# Patient Record
Sex: Female | Born: 1950 | Race: White | Marital: Married | Smoking: Former smoker
Health system: Northeastern US, Academic
[De-identification: ages and names within clinical notes are randomized; demographics above are authoritative.]

## PROBLEM LIST (undated history)

## (undated) DIAGNOSIS — Z79811 Long term (current) use of aromatase inhibitors: Secondary | ICD-10-CM

## (undated) DIAGNOSIS — I2699 Other pulmonary embolism without acute cor pulmonale: Secondary | ICD-10-CM

## (undated) DIAGNOSIS — R55 Syncope and collapse: Secondary | ICD-10-CM

## (undated) DIAGNOSIS — F32A Depression, unspecified: Secondary | ICD-10-CM

## (undated) DIAGNOSIS — M1712 Unilateral primary osteoarthritis, left knee: Secondary | ICD-10-CM

## (undated) DIAGNOSIS — G500 Trigeminal neuralgia: Secondary | ICD-10-CM

## (undated) DIAGNOSIS — D3910 Neoplasm of uncertain behavior of unspecified ovary: Secondary | ICD-10-CM

## (undated) DIAGNOSIS — M797 Fibromyalgia: Secondary | ICD-10-CM

## (undated) DIAGNOSIS — M549 Dorsalgia, unspecified: Secondary | ICD-10-CM

## (undated) DIAGNOSIS — D72819 Decreased white blood cell count, unspecified: Secondary | ICD-10-CM

## (undated) DIAGNOSIS — Z720 Tobacco use: Secondary | ICD-10-CM

## (undated) DIAGNOSIS — G521 Disorders of glossopharyngeal nerve: Secondary | ICD-10-CM

## (undated) DIAGNOSIS — I82409 Acute embolism and thrombosis of unspecified deep veins of unspecified lower extremity: Secondary | ICD-10-CM

## (undated) DIAGNOSIS — M810 Age-related osteoporosis without current pathological fracture: Secondary | ICD-10-CM

## (undated) DIAGNOSIS — K219 Gastro-esophageal reflux disease without esophagitis: Secondary | ICD-10-CM

## (undated) DIAGNOSIS — C55 Malignant neoplasm of uterus, part unspecified: Secondary | ICD-10-CM

## (undated) DIAGNOSIS — K529 Noninfective gastroenteritis and colitis, unspecified: Secondary | ICD-10-CM

## (undated) DIAGNOSIS — H269 Unspecified cataract: Secondary | ICD-10-CM

## (undated) DIAGNOSIS — J449 Chronic obstructive pulmonary disease, unspecified: Secondary | ICD-10-CM

## (undated) HISTORY — DX: Decreased white blood cell count, unspecified: D72.819

## (undated) HISTORY — DX: Neoplasm of uncertain behavior of unspecified ovary: D39.10

## (undated) HISTORY — DX: Trigeminal neuralgia: G50.0

## (undated) HISTORY — PX: BREAST BIOPSY: SHX20

## (undated) HISTORY — PX: APPENDECTOMY: SHX54

## (undated) HISTORY — DX: Other pulmonary embolism without acute cor pulmonale: I26.99

## (undated) HISTORY — DX: Noninfective gastroenteritis and colitis, unspecified: K52.9

## (undated) HISTORY — DX: Malignant neoplasm of uterus, part unspecified: C55

## (undated) HISTORY — DX: Unspecified cataract: H26.9

## (undated) HISTORY — DX: Tobacco use: Z72.0

## (undated) HISTORY — DX: Disorders of glossopharyngeal nerve: G52.1

## (undated) HISTORY — DX: Gastro-esophageal reflux disease without esophagitis: K21.9

## (undated) HISTORY — DX: Syncope and collapse: R55

## (undated) HISTORY — DX: Dorsalgia, unspecified: M54.9

## (undated) HISTORY — DX: Depression, unspecified: F32.A

## (undated) HISTORY — DX: Fibromyalgia: M79.7

## (undated) HISTORY — PX: OVARY REMOVAL: SHX86

## (undated) HISTORY — DX: Acute embolism and thrombosis of unspecified deep veins of unspecified lower extremity: I82.409

## (undated) HISTORY — DX: Age-related osteoporosis without current pathological fracture: M81.0

## (undated) HISTORY — DX: Long term (current) use of aromatase inhibitors: Z79.811

## (undated) HISTORY — DX: Unilateral primary osteoarthritis, left knee: M17.12

## (undated) HISTORY — DX: Chronic obstructive pulmonary disease, unspecified: J44.9

## (undated) MED FILL — ERX 40807065 - SEQUENCING OF DRUG OF ADMINISTRATION: Qty: 1 | Status: AC

## (undated) MED FILL — Paclitaxel IV Conc 300 MG/50ML (6 MG/ML): INTRAVENOUS | Qty: 40 | Status: AC

## (undated) MED FILL — Carboplatin IV Soln 450 MG/45ML: INTRAVENOUS | Qty: 35.9 | Status: AC

## (undated) MED FILL — Carboplatin IV Soln 450 MG/45ML: INTRAVENOUS | Qty: 37.75 | Status: AC

---
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
## 1968-01-31 HISTORY — PX: MANDIBLE FRACTURE SURGERY: SHX706

## 1995-01-31 HISTORY — PX: OTHER SURGICAL HISTORY: SHX169

## 1998-01-30 HISTORY — PX: KNEE ARTHROSCOPY: SHX127

## 2001-01-30 HISTORY — PX: CRANIOTOMY: SHX93

## 2003-01-31 HISTORY — PX: ANKLE SURGERY: SHX546

## 2007-01-31 HISTORY — PX: CHOLECYSTECTOMY: SHX55

## 2008-05-30 HISTORY — PX: UPPER GASTROINTESTINAL ENDOSCOPY: SHX188

## 2008-06-10 ENCOUNTER — Encounter: Payer: Self-pay | Admitting: Cardiology

## 2008-06-16 DIAGNOSIS — C069 Malignant neoplasm of mouth, unspecified: Secondary | ICD-10-CM

## 2008-06-16 HISTORY — DX: Malignant neoplasm of mouth, unspecified: C06.9

## 2008-06-17 ENCOUNTER — Encounter: Payer: Self-pay | Admitting: Cardiology

## 2008-07-06 DIAGNOSIS — C430 Malignant melanoma of lip: Secondary | ICD-10-CM

## 2008-07-06 HISTORY — DX: Malignant melanoma of lip: C43.0

## 2008-10-22 ENCOUNTER — Encounter: Payer: Self-pay | Admitting: Cardiology

## 2009-01-04 ENCOUNTER — Encounter: Payer: Self-pay | Admitting: Otolaryngology

## 2009-02-04 HISTORY — PX: COLONOSCOPY: SHX174

## 2010-01-30 HISTORY — PX: TOTAL KNEE ARTHROPLASTY: SHX125

## 2010-01-30 HISTORY — PX: SALPINGO-OOPHORECTOMY: SHX82

## 2010-01-30 HISTORY — PX: PARTIAL HYSTERECTOMY: SHX80

## 2010-07-31 HISTORY — PX: ENDOMETRIAL BIOPSY: SHX622

## 2010-08-23 DIAGNOSIS — E559 Vitamin D deficiency, unspecified: Secondary | ICD-10-CM | POA: Insufficient documentation

## 2010-11-15 ENCOUNTER — Encounter: Payer: Self-pay | Admitting: Gastroenterology

## 2010-11-15 NOTE — Unmapped External Note (Signed)
Post Operative/Procedure Note      Procedure(s) (LRB):  LAPAROSCOPIC SALPINGO-OOPHORECTOMY (Bilateral),  HYSTEROSCOPY DIAGNOSTIC, DILATATION & CURETTAGE (CPT & [id number]), cystoscopy, polypectomy    Findings: Anterior and posterior vaginal wall prolapse ( Grade II). Uterus sounded to 9 cm.   Endometrial polyp on anterior uterine wall. Hemorrhagic right ovarian cyst ( ruptured) with approximately 50 ml of blood in the belly.   Normal ovary on the left. Normal appearing bilateral fallopian tubes.   Cystoscopy: Bilateral ureteral jets.      Surgeon(s):  Worthy Flank, DO  Ailene Rud, MD     Assistants: Dr. Ronnette Juniper    Anesthesia:  General    Blood Loss:  less than 50 mL    Fluids: As per anesthesia.            Drains:none    Condition:  stable              Specimens Removed:  Endometrial curetting's, Endometrial Polyp, Right hemorrhagic ovarian cyst. Bilateral fallopian tubes and left ovary.            Postoperative Diagnosis: POST MENOPAUSAL BLEEDING,COMPLEX OVARIAN CYST  Dictation: #[id number]  Ailene Rud, MD

## 2010-11-28 ENCOUNTER — Encounter: Payer: Self-pay | Admitting: Gastroenterology

## 2010-12-13 ENCOUNTER — Encounter: Payer: Self-pay | Admitting: Gynecologic Oncology

## 2010-12-14 ENCOUNTER — Encounter: Payer: Self-pay | Admitting: Gynecologic Oncology

## 2010-12-14 ENCOUNTER — Ambulatory Visit: Payer: Self-pay | Admitting: Gynecologic Oncology

## 2010-12-14 VITALS — BP 132/70 | HR 82 | Ht 60.24 in | Wt 158.2 lb

## 2010-12-14 DIAGNOSIS — C569 Malignant neoplasm of unspecified ovary: Secondary | ICD-10-CM

## 2010-12-14 NOTE — Progress Notes (Signed)
Subjective:      Patient ID: Julie Walsh is a 60 y.o. female    HPI patient recently had laparoscopic BSO patient presented with bleeding and ultrasound was done. Had D&C at time of laparoscopy. 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 Path from [facility] showed simple hyperplasia without atypia.    Chief Complaint   Patient presents with   . Initial Evaluation    No past medical history on file.   Patient Active Problem List   Diagnoses Date Noted   . 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 Carcinoma Of The Lip 07/03/2008     Created by Conversion       . 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 Basal Cell Carcinoma Of The Skin Of The Face 05/28/2008     Created by Conversion         Past Surgical History   Procedure Date   . Total knee arthroplasty 2012   . Partial hysterectomy 2012   . Cholecystectomy 2011       Current Outpatient Prescriptions on File Prior to Visit   Medication Sig Dispense Refill   . mometasone (ELOCON) 0.1 % cream   45  0   . omeprazole (PRILOSEC) 20 MG capsule Take  by mouth.  30  0   . trazodone (DESYREL) 150 MG tablet     0   . warfarin (COUMADIN) 4 MG tablet     0   . metoclopramide (REGLAN) 5 MG tablet     0   . SF 5000 PLUS 1.1 % dental gel Place  onto teeth.  51  0   . HYDROcodone-acetaminophen (VICODIN) 5-500 MG per tablet     0   . pantoprazole (PROTONIX) 40 MG EC tablet     0     Allergies   Allergen Reactions   . Feldene (Piroxicam) Rash   . No Known Latex Allergy      Created by Conversion - 0;    }     Review of Systems   Constitutional: Negative.    Eyes: Negative.    Respiratory: Negative.    Cardiovascular: Negative.    Gastrointestinal: Negative.    Genitourinary: Negative.    Musculoskeletal: Negative.    Skin: Negative.     Neurological: Negative.    Hematological: Negative.    Psychiatric/Behavioral: Negative.            Objective:     Filed Vitals:    12/14/10 1246   BP: 132/70   Pulse: 82   Height: 153 cm (5' 0.24")   Weight: 71.759 kg (158 lb 3.2 oz)       No results found for this or any previous visit (from the past 72 hour(s)).        Physical Exam   Constitutional: She is oriented to person, place, and time. She appears well-developed and well-nourished.   HENT:   Head: Normocephalic and atraumatic.   Eyes: Conjunctivae and EOM are normal. Pupils are equal, round, and reactive to light.   Neck: Normal range of  motion. Neck supple.   Cardiovascular: Normal rate and regular rhythm.    Pulmonary/Chest: Effort normal and breath sounds normal.   Abdominal: Soft. Bowel sounds are normal.   Musculoskeletal: Normal range of motion.   Neurological: She is alert and oriented to person, place, and time. She has normal reflexes.   Skin: Skin is warm and dry.       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 Basal Cell Carcinoma Of The Skin Of The Face 173.3   . Lentigo Maligna Melanoma Of The Skin 172.9   . Adenocarcinoma Of The Oral Cavity 145.9   . Melanoma In Situ Of The Lip 230.0   . Carcinoma Of The Lip 140.9   . Malignant Melanoma Of The Lip 172.0         Plan:     Observation with f/u abdominal/pelvic ultrasound in 6 months  Discussed additional surgical intervention - not clear that this is warranted at this time.

## 2010-12-14 NOTE — Progress Notes (Deleted)
Subjective:      Patient ID: Julie Walsh is a 60 y.o. female    HPI patient recently had laparoscopic BSO patient presented with bleeding and ultrasound was done. Had D&C at time of laparoscopy. 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 Path from [facility] showed simple hyperplasia without atypia.    Chief Complaint   Patient presents with   . Initial Evaluation    No past medical history on file.   Patient Active Problem List   Diagnoses Date Noted   . 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 Carcinoma Of The Lip 07/03/2008     Created by Conversion       . 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 Basal Cell Carcinoma Of The Skin Of The Face 05/28/2008     Created by Conversion         Past Surgical History   Procedure Date   . Total knee arthroplasty 2012   . Partial hysterectomy 2012   . Cholecystectomy 2011       Current Outpatient Prescriptions on File Prior to Visit   Medication Sig Dispense Refill   . mometasone (ELOCON) 0.1 % cream   45  0   . omeprazole (PRILOSEC) 20 MG capsule Take  by mouth.  30  0   . trazodone (DESYREL) 150 MG tablet     0   . warfarin (COUMADIN) 4 MG tablet     0   . metoclopramide (REGLAN) 5 MG tablet     0   . SF 5000 PLUS 1.1 % dental gel Place  onto teeth.  51  0   . HYDROcodone-acetaminophen (VICODIN) 5-500 MG per tablet     0   . pantoprazole (PROTONIX) 40 MG EC tablet     0     Allergies   Allergen Reactions   . Feldene (Piroxicam) Rash   . No Known Latex Allergy      Created by Conversion - 0;    }     Review of Systems   Constitutional: Negative.    Eyes: Negative.    Respiratory: Negative.    Cardiovascular: Negative.    Gastrointestinal: Negative.    Genitourinary: Negative.    Musculoskeletal: Negative.    Skin: Negative.     Neurological: Negative.    Hematological: Negative.    Psychiatric/Behavioral: Negative.            Objective:     Filed Vitals:    12/14/10 1246   BP: 132/70   Pulse: 82   Height: 153 cm (5' 0.24")   Weight: 71.759 kg (158 lb 3.2 oz)       No results found for this or any previous visit (from the past 72 hour(s)).        Physical Exam   Constitutional: She is oriented to person, place, and time. She appears well-developed and well-nourished.   HENT:   Head: Normocephalic and atraumatic.   Eyes: Conjunctivae and EOM are normal. Pupils are equal, round, and reactive to light.   Neck: Normal range of  motion. Neck supple.   Cardiovascular: Normal rate and regular rhythm.    Pulmonary/Chest: Effort normal and breath sounds normal.   Abdominal: Soft. Bowel sounds are normal.   Musculoskeletal: Normal range of motion.   Neurological: She is alert and oriented to person, place, and time. She has normal reflexes.   Skin: Skin is warm and dry.       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 Basal Cell Carcinoma Of The Skin Of The Face 173.3   . Lentigo Maligna Melanoma Of The Skin 172.9   . Adenocarcinoma Of The Oral Cavity 145.9   . Melanoma In Situ Of The Lip 230.0   . Carcinoma Of The Lip 140.9   . Malignant Melanoma Of The Lip 172.0         Plan:     Observation with f/u abdominal/pelvic ultrasound in 6 months  Discussed additional surgical intervention - not clear that this is warranted at this time.

## 2011-12-08 DIAGNOSIS — D3910 Neoplasm of uncertain behavior of unspecified ovary: Secondary | ICD-10-CM | POA: Insufficient documentation

## 2011-12-08 DIAGNOSIS — C561 Malignant neoplasm of right ovary: Secondary | ICD-10-CM

## 2011-12-08 HISTORY — DX: Malignant neoplasm of right ovary: C56.1

## 2011-12-13 ENCOUNTER — Ambulatory Visit: Payer: Self-pay | Admitting: Gynecologic Oncology

## 2011-12-13 ENCOUNTER — Encounter: Payer: Self-pay | Admitting: Gynecologic Oncology

## 2011-12-13 VITALS — BP 130/88 | HR 85 | Ht 60.24 in | Wt 165.0 lb

## 2011-12-13 DIAGNOSIS — D3910 Neoplasm of uncertain behavior of unspecified ovary: Secondary | ICD-10-CM

## 2011-12-13 NOTE — Progress Notes (Signed)
Subjective:      Patient ID: Julie Walsh is a 61 y.o. female    HPI patient had laparoscopic BSO patient presented with bleeding and ultrasound was done. Had D&C at time of laparoscopy. 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 Path from [facility] showed simple hyperplasia without atypia.    Chief Complaint   Patient presents with   . Follow-up    No past medical history on file.   Patient Active Problem List    Diagnosis Date Noted   . Granulosa cell tumor of ovary 12/08/2011   . 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 Basal Cell Carcinoma Of The Skin Of The Face 05/28/2008     Created by Conversion         Past Surgical History   Procedure Laterality Date   . Total knee arthroplasty  2012   . Partial hysterectomy  2012   . Cholecystectomy  2011       Current Outpatient Prescriptions on File Prior to Visit   Medication Sig Dispense Refill   . Cholecalciferol (VITAMIN D3) 1000 UNIT tablet Take 1,000 Units by mouth daily           . mometasone (ELOCON) 0.1 % cream   45  0   . omeprazole (PRILOSEC) 20 MG capsule Take  by mouth.  30  0   . HYDROcodone-acetaminophen (VICODIN) 5-500 MG per tablet     0   . pantoprazole (PROTONIX) 40 MG EC tablet     0   . trazodone (DESYREL) 150 MG tablet     0   . warfarin (COUMADIN) 4 MG tablet     0   . metoclopramide (REGLAN) 5 MG tablet     0   . SF 5000 PLUS 1.1 % dental gel Place  onto teeth.  51  0     No current facility-administered medications on file prior to visit.     Allergies   Allergen Reactions   . Feldene (Piroxicam) Rash   . No Known Latex Allergy      Created by Conversion - 0;    }     Review of Systems   Constitutional: Negative.    Eyes: Negative.    Respiratory: Negative.    Cardiovascular: Negative.    Gastrointestinal: Negative.    Genitourinary: Negative.    Musculoskeletal: Negative.    Skin: Negative.    Neurological: Negative.     Hematological: Negative.    Psychiatric/Behavioral: Negative.            Objective:     Filed Vitals:    12/13/11 1034   BP: 130/88   Pulse: 85   Height: 153 cm (5' 0.24")   Weight: 74.844 kg (165 lb)       No results found for this or any previous visit (from the past 72 hour(s)).        Physical Exam   Constitutional: She is oriented to person, place, and time. She appears well-developed and well-nourished.   HENT:   Head: Normocephalic and atraumatic.   Eyes: Conjunctivae and EOM are normal. Pupils are equal, round, and reactive to light.   Neck: Normal range of motion. Neck supple.   Cardiovascular: Normal rate and regular rhythm.    Pulmonary/Chest:  Effort normal and breath sounds normal.   Abdominal: Soft. Bowel sounds are normal.   Musculoskeletal: Normal range of motion.   Neurological: She is alert and oriented to person, place, and time. She has normal reflexes.   Skin: Skin is warm and dry.   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 Basal Cell Carcinoma Of The Skin Of The Face 173.3   . Adenocarcinoma Of The Oral Cavity 145.9   . Malignant Melanoma Of The Lip 172.0   . Granulosa cell tumor of ovary 236.2         Plan:     Observation  RTC one year  I discussed the proposed plan with the patient and answered all questions.

## 2012-11-27 ENCOUNTER — Encounter: Payer: Self-pay | Admitting: Gynecologic Oncology

## 2012-11-27 ENCOUNTER — Ambulatory Visit: Payer: Self-pay | Admitting: Gynecologic Oncology

## 2012-11-27 VITALS — BP 127/70 | HR 76 | Ht 60.24 in | Wt 156.0 lb

## 2012-11-27 DIAGNOSIS — D3910 Neoplasm of uncertain behavior of unspecified ovary: Secondary | ICD-10-CM

## 2012-11-27 NOTE — Progress Notes (Signed)
Subjective:     Julie Walsh is a 62 y.o. female here today for routine follow up. 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    She is feeling well. Denies vaginal bleeding or spotting, changes with bowel or bladder.     Last Mammogram: last week - normal   Last Colonoscopy: UTD     Review of Systems:   Constitutional: Negative.   Respiratory: Negative. Negative for shortness of breath.   Cardiovascular: Negative. Negative for chest pain.   Gastrointestinal: Negative. Denies changes with bowel or bladder habits  Genitourinary: Negative. Denies vaginal bleeding or spotting.     Objective:   Filed Vitals:    11/27/12 1020   BP: 127/70   Pulse: 76   Height: 153 cm (5' 0.24")   Weight: 70.761 kg (156 lb)     Pain    11/27/12 1020   PainSc:   0 - No pain       Physical Exam:   Constitutional: Alert and Oriented x3, in NAD. She appears well developed and well nourished.   CV: RRR   Resp: CTA bilaterally   Abd: Soft, non tender, no distention  GU: Vagina normal, Surgically absent cervix, uterus, and adnexa. Smooth mobile vaginal cuff without nodularity. No masses palpated on bimanual or rectovaginal exams. No nodularity of the rectovaginal septum.   MSK: No edema   Neuro: A&O x3   Skin: Warm & Dry   Psych: normal mood and affect.    Assessment:  This is a 62 y.o. 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

## 2013-01-17 DIAGNOSIS — R030 Elevated blood-pressure reading, without diagnosis of hypertension: Secondary | ICD-10-CM | POA: Insufficient documentation

## 2013-10-22 ENCOUNTER — Ambulatory Visit: Payer: Self-pay | Admitting: Internal Medicine

## 2013-10-22 VITALS — BP 120/75 | HR 74 | Ht 60.24 in | Wt 157.0 lb

## 2013-10-22 DIAGNOSIS — D3910 Neoplasm of uncertain behavior of unspecified ovary: Secondary | ICD-10-CM

## 2013-10-22 NOTE — Progress Notes (Signed)
Subjective:     Julie Walsh is a 63 y.o. female here today for routine follow up. She is feeling well. Denies vaginal bleeding or spotting, changes with bowel or bladder.     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       Review of Systems:   Constitutional: Negative.   Respiratory: Negative. Negative for shortness of breath.   Cardiovascular: Negative. Negative for chest pain.   Gastrointestinal: Negative. Denies changes with bowel or bladder habits  Genitourinary: Negative. Denies vaginal bleeding or spotting.     Objective:   Filed Vitals:    10/22/13 0952   BP: 120/75   Pulse: 74   Height: 153 cm (5' 0.24")   Weight: 71.215 kg (157 lb)     Pain    10/22/13 0952   PainSc:   0 - No pain       Physical Exam:   Constitutional: Alert and Oriented x3, in NAD. She appears well developed and well nourished.   CV: RRR   Resp: CTA bilaterally   Abd: Soft, non tender, no distention  GU: Rectocele, Surgically absent cervix, uterus, and adnexa. Palpable mass at distal end of vagina. Evaluated by Dr. Loney Loh - he felt it was consistent with cervical tissue. No masses palpated on bimanual or rectovaginal exams. No nodularity of the rectovaginal septum.   MSK: No edema   Neuro: A&O x3   Skin: Warm & Dry   Psych: normal mood and affect.    Assessment:  This is a 63 y.o. female with a history of Stage i granulosa cell tumor of the ovary diagnosed in 12/2010 with NED    Plan:   PAP today   RTC in 12 months

## 2013-10-27 LAB — GYN CYTOLOGY

## 2013-11-19 ENCOUNTER — Ambulatory Visit: Payer: Self-pay | Admitting: Internal Medicine

## 2014-02-05 DIAGNOSIS — N393 Stress incontinence (female) (male): Secondary | ICD-10-CM | POA: Insufficient documentation

## 2014-02-05 HISTORY — DX: Stress incontinence (female) (male): N39.3

## 2014-10-19 ENCOUNTER — Ambulatory Visit: Payer: Self-pay | Admitting: Internal Medicine

## 2014-10-19 VITALS — BP 120/65 | HR 81 | Ht 60.24 in | Wt 160.3 lb

## 2014-10-19 DIAGNOSIS — D3910 Neoplasm of uncertain behavior of unspecified ovary: Secondary | ICD-10-CM

## 2014-10-20 ENCOUNTER — Ambulatory Visit: Payer: Self-pay | Admitting: Internal Medicine

## 2014-10-20 NOTE — Progress Notes (Signed)
Subjective:     Julie Walsh is a 64 y.o. female here today for routine follow up. She has a history of granulosa cell tumor of the ovary. She is feeling well. Denies vaginal bleeding or spotting, changes with bowel or bladder.     Review of Systems:   Constitutional: Negative.   Respiratory: Negative. Negative for shortness of breath.   Cardiovascular: Negative. Negative for chest pain.   Gastrointestinal: Negative. Denies changes with bowel or bladder habits  Genitourinary: Negative. Denies vaginal bleeding or spotting.     Objective:   Vitals:    10/19/14 0937   BP: 120/65   Pulse: 81   Weight: 72.7 kg (160 lb 4.8 oz)   Height: 153 cm (5' 0.24")     Pain    10/19/14 0937   PainSc:   0 - No pain       Physical Exam:   Constitutional: Alert and Oriented x3, in NAD. She appears well developed and well nourished.   CV: RRR   Resp: CTA bilaterally   Abd: Soft, non tender, no distention,   GU: Vagina normal, small amount of remaining cervix. Surgically absent, uterus, and adnexa. Smooth mobile vaginal cuff without nodularity. No masses palpated on bimanual or rectovaginal exams. No nodularity of the rectovaginal septum.   MSK: No edema   Neuro: A&O x3   Skin: Warm & Dry   Psych: normal mood and affect.    Assessment:  This is a 64 y.o. female with a history of granulosa cell tumor of the ovary with NED    Plan:   RTC in 12 months

## 2014-10-21 ENCOUNTER — Ambulatory Visit: Payer: Self-pay | Admitting: Internal Medicine

## 2015-06-11 HISTORY — PX: COLONOSCOPY: SHX174

## 2015-07-13 DIAGNOSIS — K635 Polyp of colon: Secondary | ICD-10-CM

## 2015-07-13 HISTORY — DX: Polyp of colon: K63.5

## 2015-08-11 HISTORY — PX: COLONOSCOPY: SHX174

## 2015-10-18 ENCOUNTER — Ambulatory Visit: Payer: Self-pay | Admitting: Internal Medicine

## 2017-01-12 NOTE — Progress Notes (Signed)
Subjective:        HPI Julie Walsh is a 66 y.o. 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 Patient had laparoscopic BSO in 2012 after presenting with PMB. The pathology revealed granulosa cell tumor of the ovary. She presents today for 12 month surveillance visit. She is feeling well and denies vaginal bleeding and discharge, changes in bowel and bladder habits, abdominal pain and bloating, changes in appetite, and fatigue. She is feeling well overall and has no complaints or concerns today.       Patient's medications, allergies, past medical, surgical, social and family histories were reviewed and updated as appropriate.    Review of Systems  Constitutional: Negative.    HENT: Negative.    Respiratory: Negative.    Cardiovascular: Negative.    Gastrointestinal: Negative for abdominal distention and abdominal pain.   Endocrine: Negative.    Genitourinary: Negative for vaginal bleeding and vaginal discharge.   Musculoskeletal: Negative.    Neurological: Negative.   All other systems were reviewed and are negative      Objective:     Vitals:    01/17/17 1341   BP: 134/70   Pulse: 78   Weight: 78 kg (172 lb)   Height: 153 cm (5' 0.24")       Pain    01/17/17 1341   PainSc:   0 - No pain       Physical Exam   Constitutional: She appears well developed and well nourished and in NAD  CV: RRR   Resp: CTA bilaterally   Abd: Soft, non tender, no distention, well healed surgical incisions without evidence of infection  GU: Vagina normal with small amount of remaining cervix, surgically absent  uterus, and adnexa. Smooth vaginal cuff without nodularity. No masses palpated on bimanual and rectovaginal exam. No nodularity or masses palpated on rectovaginal septum.   MSK: No edema  Lymph: No cervical or inguinal lymphadenopathy noted   Neuro: A&O x3   Skin: Warm & Dry   Psych: Normal mood and affect.  Vital signs reviewed      Assessment:     Julie Walsh is a 66 y.o. 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 Margrett Rud FNP-C

## 2017-01-17 ENCOUNTER — Ambulatory Visit: Payer: Medicare (Managed Care) | Attending: Oncology | Admitting: Oncology

## 2017-01-17 ENCOUNTER — Encounter: Payer: Self-pay | Admitting: Oncology

## 2017-01-17 VITALS — BP 134/70 | HR 78 | Ht 60.24 in | Wt 172.0 lb

## 2017-01-17 DIAGNOSIS — D3910 Neoplasm of uncertain behavior of unspecified ovary: Secondary | ICD-10-CM

## 2017-08-29 DIAGNOSIS — M19072 Primary osteoarthritis, left ankle and foot: Secondary | ICD-10-CM | POA: Insufficient documentation

## 2017-08-29 LAB — UNMAPPED LAB RESULTS
Hematocrit (HT): 42 % (ref 34.0–47.0)
Hemoglobin (HGB) (HT): 14.1 g/dL (ref 11.5–16.0)
MCHC (HT): 33.6 g/dL (ref 32.0–36.0)
MCV (HT): 91.7 FL (ref 81.0–99.0)
Mean Corpuscular Hemoglobin (MCH) (HT): 30.8 pg (ref 26.0–34.0)
Platelets (HT): 292 10 3/uL (ref 140–400)
RBC (HT): 4.58 10 6/uL (ref 3.80–5.20)
RDW (HT): 12.8 % (ref 11.5–15.0)
WBC (HT): 5.5 10 3/uL (ref 4.0–10.8)

## 2018-01-04 NOTE — Progress Notes (Signed)
I tried calling Julie Walsh but no one answered her home phone and I couldn't leave a message. I'll try calling again later today to inform her that she is going to be seeing the other NP Janelle at her appointment on 01/16/18 at 1pm

## 2018-01-16 ENCOUNTER — Ambulatory Visit: Payer: Medicare Other | Admitting: Gynecologic Oncology

## 2018-01-17 ENCOUNTER — Telehealth: Payer: Self-pay

## 2018-01-17 NOTE — Telephone Encounter (Signed)
Called pt to follow up on her missed appt. She was not aware of it. She thought she was done, according to her. She is currently in [place] and will be there until June. She is scheduled for when she returns and I updated her phone numbers, they were incorrect in the system and that is why she did not get the reminder call.

## 2018-03-02 DIAGNOSIS — C569 Malignant neoplasm of unspecified ovary: Secondary | ICD-10-CM

## 2018-03-02 HISTORY — DX: Malignant neoplasm of unspecified ovary: C56.9

## 2018-03-05 NOTE — Progress Notes (Signed)
Patient's doctor's office in [place] will be faxing over a medical release for patient's medical records.

## 2018-03-06 ENCOUNTER — Telehealth: Payer: Medicare Other

## 2018-03-06 NOTE — Telephone Encounter (Signed)
Writer spoke to back to give her requested info.

## 2018-03-06 NOTE — Telephone Encounter (Signed)
Julie Walsh from Dr. [facility] office in [place] called to let us know that patient is scheduled with them to have surgery on Monday 03/11/2018. Patient will have Pelvic mass removed with a possibility of a hysterectomy.  Patient is in [place] until the spring and scheduled with [place] in June.

## 2018-03-06 NOTE — Telephone Encounter (Signed)
Name of Caller: Lataria  Relationship to Patient: Same  Phone Number: [phone]  Provider: Dr. Loney Loh  Main Concern/Reason for call: Patient called to find out what type of cancer she was treated for in the past.

## 2018-03-06 NOTE — Progress Notes (Signed)
I received a request for Julie Walsh's records to be sent to Dr. Sanda Linger to fax number [phone]. I completed the request and sent it to be scanned.

## 2018-05-29 ENCOUNTER — Telehealth: Payer: Self-pay

## 2018-05-29 NOTE — Telephone Encounter (Signed)
Called patient to reschedule her upcoming appointment and she stated she is in [place], stuck there according to her and getting treated for Ovarian cancer. Patient stated she had an emergency surgery with one of the oncologists there recently and will be staying there for treatment. Let her know I would cancel her appointment here and that to reach out to [place] if she needs anything.

## 2018-07-17 ENCOUNTER — Ambulatory Visit: Payer: Medicare Other | Admitting: Gynecologic Oncology

## 2018-08-01 HISTORY — PX: ABDOMINAL EXPLORATION SURGERY: SHX538

## 2018-10-16 LAB — UNMAPPED LAB RESULTS
Hematocrit (HT): 37.8 % (ref 34.0–47.0)
Hemoglobin (HGB) (HT): 12.5 g/dL (ref 11.5–16.0)
MCHC (HT): 33.1 g/dL (ref 32.0–36.0)
MCV (HT): 91.7 FL (ref 81.0–99.0)
Mean Corpuscular Hemoglobin (MCH) (HT): 30.3 pg (ref 26.0–34.0)
Platelets (HT): 299 10 3/uL (ref 140–400)
RBC (HT): 4.12 10 6/uL (ref 3.80–5.20)
RDW (HT): 13.5 % (ref 11.5–15.0)
WBC (HT): 5.4 10 3/uL (ref 4.0–10.8)

## 2018-10-16 NOTE — Progress Notes (Signed)
Subjective      Name Julie Walsh MRN [id number]    DOB 04-25-1950 AGE/SEX 68 y.o./ female     Chief Complaint Chief Complaint   Patient presents with   . Medication Management     f/u from 08/29/17, LOV no medication changes were made labs ordered and done ' DEXA and mammogram still pending    . Colonoscopy Screening     due for colonoscopy this year last done 08/11/15 f/u 3 years ' pt states she no longer needs it due to having ovarian cancer and her colon being damaged during the surgery    . Other     PHQQ9=10         Visit History for  10/16/2018   Problem List <redacted file path>* Health Maintenance <redacted file path>* Results/Data <redacted file path>*  Review Flowsheets <redacted file path>*   History of Present Illness  HPI   Follow-up from 1 year ago.  Patient has been in [place] until recently.  She lives there now but spends several months here in the summertime.  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  She had several inches of intestines removed.  Surgery was complicated.  She was offered no postoperative chemotherapy.        ROS   She reports weakness and fatigue.  She was overcome by this the other day when shopping in a store     Medications <redacted file path> and  Allergies <redacted file path>   Outpatient Medications Marked as Taking for the 10/16/18 encounter (Office Visit) with Gifford Shave, MD   Medication Sig Dispense Refill   . cholecalciferol, Vitamin D3, 2,000 unit Oral Cap Take 1 capsule by mouth daily. 30 capsule 11   . ELIQUIS 2.5 mg Oral Tab tablet TAKE 1 TABLET 2 TIMES DAILY. USES FOR TRAVEL 2 DAYS BEFORE DEPARTURE UNTIL 7 DAYS AFTER ARRIVALS 180 tablet 0   . famotidine 40 MG Oral tablet TAKE 1 TABLET BY MOUTH EVERY DAY AT BEDTIME AS NEEDED 90 tablet 0   . gabapentin 300 MG Oral capsule Take 300 mg by mouth as needed.     . MULTIVIT 33-MTFOLATE-NAC-CHROM ORAL Take  by mouth.     . TRAZODONE 100 MG Oral tablet TAKE 2 TABLETS AT BEDTIME  180 tablet 3   . [DISCONTINUED] FAMOTIDINE 40 MG Oral tablet TAKE 1 TABLET BY MOUTH EVERY DAY AT BEDTIME AS NEEDED 90 tablet 0     Allergies   Allergen Reactions   . Feldene [Piroxicam] Rash       I have reviewed the above medications and allergies    Objective:     Vitals  Modify Vitals <redacted file path>  height is 1.524 m (5') and weight is 60 kg (132 lb 4.8 oz). Her temperature is 36.8 C (98.2 F). Her blood pressure is 100/60 and her pulse is 86. Her oxygen saturation is 97%.  Body mass index is 25.84 kg/m.       Physical Exam  Vitals signs and nursing note reviewed.   Constitutional:       Appearance: She is well-developed.   Cardiovascular:      Rate and Rhythm: Normal rate and regular rhythm.      Heart sounds: Normal heart sounds.   Pulmonary:      Effort: Pulmonary effort is normal.      Breath sounds: Normal breath sounds.   Abdominal:      General: Bowel sounds are  normal.      Palpations: Abdomen is soft.   Neurological:      Mental Status: She is alert and oriented to person, place, and time.                 Assessment/Plan <redacted file path>   Clinical References <redacted file path>*   Pt Intr & Followup <redacted file path>*   Wrap Up <redacted file path>*     Issues Addressed/ Plan <redacted file path>     Problem List Items Addressed This Visit     Anxiety and depression    Ovarian cancer, right (CMS HCC Code) - Primary     Recurrent ovarian cancer. Had surgery. Removed some of her small intestine 03/03/18, 03/05/18 and 8/20 (ileostomy reversal). Not on chemotherapy or radiation therapy. She's under the care of an oncologist in [place].     I would like her evaluated here.  I have read in up-to-date that people with granulosa cell tumors causing recurrent disease should be offered chemotherapy.  We need to get the pathology results from her [facility] hospital ([facility])         Pre-syncope     She is lost a significant amount of muscle mass since her operation because of  complications of surgery.  I think she is just weak.  Check blood test shown here         Vitamin D deficiency    Relevant Orders    Vitamin D 25-OH      Other Visit Diagnoses     Gastroesophageal reflux disease, esophagitis presence not specified        Relevant Medications    famotidine 40 MG Oral tablet    Other Relevant Orders    Magnesium    Vitamin B12    Chronic fatigue        Relevant Orders    TSH with Reflex to Free T4                      Follow up  recommended <redacted file path> Return if symptoms worsen or fail to improve.     Patient verbalized understanding of the above instructions.     Gifford Shave, MD

## 2018-10-16 NOTE — Progress Notes (Signed)
Search Terms: Jayley, Midgette 09/10/1950   Search Date: 10/16/2018 08:15:18 AM   Searching on behalf of: [id number] - Gifford Shave   The Drug Utilization Report below displays all of the controlled substance prescriptions, if any, that your patient has filled in the last twelve months. The information displayed on this report is compiled from pharmacy submissions to the Department, and accurately reflects the information as submitted by the pharmacies.  This report was requested by: Claudina Lick  Reference #: [id number]   There are no results for the search terms that you entered.

## 2018-11-01 LAB — UNMAPPED LAB RESULTS
Basophil # (HT): 0.1 10 3/uL (ref 0.0–0.2)
Basophil % (HT): 1 % (ref 0.0–1.8)
Eosinophil # (HT): 0.2 10 3/uL (ref 0.0–0.5)
Eosinophil % (HT): 3.3 % (ref 0.0–7.0)
Hematocrit (HT): 36.2 % (ref 34.0–47.0)
Hemoglobin (HGB) (HT): 11.8 g/dL (ref 11.5–16.0)
Lymphocyte # (HT): 1.9 10 3/uL (ref 0.9–3.8)
Lymphocyte % (HT): 32.2 % (ref 17.0–44.0)
MCHC (HT): 32.6 g/dL (ref 32.0–36.0)
MCV (HT): 91.2 FL (ref 81.0–99.0)
Mean Corpuscular Hemoglobin (MCH) (HT): 29.7 pg (ref 26.0–34.0)
Monocyte # (HT): 0.4 10 3/uL (ref 0.2–1.0)
Monocyte % (HT): 6.9 % (ref 4.0–12.0)
Neutrophil # (HT): 3.3 10 3/uL (ref 1.5–7.7)
Platelets (HT): 310 10 3/uL (ref 140–400)
RBC (HT): 3.97 10 6/uL (ref 3.80–5.20)
RDW (HT): 13.1 % (ref 11.5–15.0)
Seg Neut % (HT): 56.6 % (ref 40.0–75.0)
WBC (HT): 5.8 10 3/uL (ref 4.0–10.8)

## 2018-11-22 LAB — UNMAPPED LAB RESULTS
Basophil # (HT): 0.1 10 3/uL (ref 0.0–0.2)
Basophil % (HT): 1.6 % (ref 0.0–1.8)
Eosinophil # (HT): 0.1 10 3/uL (ref 0.0–0.5)
Eosinophil % (HT): 2.4 % (ref 0.0–7.0)
Hematocrit (HT): 34.5 % (ref 34.0–47.0)
Hemoglobin (HGB) (HT): 11.6 g/dL (ref 11.5–16.0)
Lymphocyte # (HT): 1.6 10 3/uL (ref 0.9–3.8)
Lymphocyte % (HT): 43.7 % (ref 17.0–44.0)
MCHC (HT): 33.6 g/dL (ref 32.0–36.0)
MCV (HT): 91.8 FL (ref 81.0–99.0)
Mean Corpuscular Hemoglobin (MCH) (HT): 30.9 pg (ref 26.0–34.0)
Monocyte # (HT): 0.5 10 3/uL (ref 0.2–1.0)
Monocyte % (HT): 12.4 % — ABNORMAL HIGH (ref 4.0–12.0)
Neutrophil # (HT): 1.5 10 3/uL (ref 1.5–7.7)
Platelets (HT): 245 10 3/uL (ref 140–400)
RBC (HT): 3.76 10 6/uL — ABNORMAL LOW (ref 3.80–5.20)
RDW (HT): 13.2 % (ref 11.5–15.0)
Seg Neut % (HT): 39.9 % — ABNORMAL LOW (ref 40.0–75.0)
WBC (HT): 3.7 10 3/uL — ABNORMAL LOW (ref 4.0–10.8)

## 2018-12-13 LAB — UNMAPPED LAB RESULTS
Basophil # (HT): 0 10 3/uL (ref 0.0–0.2)
Basophil % (HT): 0.6 % (ref 0.0–1.8)
Eosinophil # (HT): 0 10 3/uL (ref 0.0–0.5)
Eosinophil % (HT): 0.9 % (ref 0.0–7.0)
Hematocrit (HT): 32.9 % — ABNORMAL LOW (ref 34.0–47.0)
Hemoglobin (HGB) (HT): 10.9 g/dL — ABNORMAL LOW (ref 11.5–16.0)
Lymphocyte # (HT): 1.8 10 3/uL (ref 0.9–3.8)
Lymphocyte % (HT): 53.8 % — ABNORMAL HIGH (ref 17.0–44.0)
MCHC (HT): 33.1 g/dL (ref 32.0–36.0)
MCV (HT): 91.9 FL (ref 81.0–99.0)
Mean Corpuscular Hemoglobin (MCH) (HT): 30.4 pg (ref 26.0–34.0)
Monocyte # (HT): 0.3 10 3/uL (ref 0.2–1.0)
Monocyte % (HT): 10.3 % (ref 4.0–12.0)
Neutrophil # (HT): 1.1 10 3/uL — ABNORMAL LOW (ref 1.5–7.7)
Platelets (HT): 316 10 3/uL (ref 140–400)
RBC (HT): 3.58 10 6/uL — ABNORMAL LOW (ref 3.80–5.20)
RDW (HT): 13.3 % (ref 11.5–15.0)
Seg Neut % (HT): 34.4 % — ABNORMAL LOW (ref 40.0–75.0)
WBC (HT): 3.3 10 3/uL — ABNORMAL LOW (ref 4.0–10.8)

## 2018-12-16 LAB — UNMAPPED LAB RESULTS
Basophil # (HT): 0 10 3/uL (ref 0.0–0.2)
Basophil % (HT): 1 % (ref 0–3)
Eosinophil # (HT): 0 10 3/uL (ref 0.0–0.6)
Eosinophil % (HT): 1 % (ref 0–5)
Hematocrit (HT): 34 % — ABNORMAL LOW (ref 35–47)
Hemoglobin (HGB) (HT): 11.3 g/dL — ABNORMAL LOW (ref 12.0–16.0)
Lymphocyte # (HT): 1.8 10 3/uL (ref 1.0–4.8)
Lymphocyte % (HT): 47 % — ABNORMAL HIGH (ref 15–45)
MCHC (HT): 33.1 g/dL (ref 31.0–37.5)
MCV (HT): 93 fL (ref 80–100)
Mean Corpuscular Hemoglobin (MCH) (HT): 31 pg (ref 26.0–34.0)
Monocyte # (HT): 0.3 10 3/uL (ref 0.1–1.0)
Monocyte % (HT): 8 % (ref 0–15)
Neutrophil # (HT): 1.6 10 3/uL — ABNORMAL LOW (ref 1.8–8.0)
Platelets (HT): 322 10 3/uL (ref 150–450)
RBC (HT): 3.65 10 6/uL — ABNORMAL LOW (ref 3.80–5.20)
RDW (HT): 13.4 % (ref 0.0–15.2)
Seg Neut % (HT): 43 % — ABNORMAL LOW (ref 45–75)
WBC (HT): 3.8 10 3/uL — ABNORMAL LOW (ref 4.0–11.0)

## 2019-01-03 LAB — UNMAPPED LAB RESULTS
Basophil # (HT): 0 10 3/uL (ref 0.0–0.2)
Basophil % (HT): 1.1 % (ref 0.0–1.8)
Eosinophil # (HT): 0 10 3/uL (ref 0.0–0.5)
Eosinophil % (HT): 0.6 % (ref 0.0–7.0)
Hematocrit (HT): 32.9 % — ABNORMAL LOW (ref 34.0–47.0)
Hemoglobin (HGB) (HT): 11.3 g/dL — ABNORMAL LOW (ref 11.5–16.0)
Lymphocyte # (HT): 1.7 10 3/uL (ref 0.9–3.8)
Lymphocyte % (HT): 48.6 % — ABNORMAL HIGH (ref 17.0–44.0)
MCHC (HT): 34.3 g/dL (ref 32.0–36.0)
MCV (HT): 91.6 FL (ref 81.0–99.0)
Mean Corpuscular Hemoglobin (MCH) (HT): 31.5 pg (ref 26.0–34.0)
Monocyte # (HT): 0.6 10 3/uL (ref 0.2–1.0)
Monocyte % (HT): 15.9 % — ABNORMAL HIGH (ref 4.0–12.0)
Neutrophil # (HT): 1.2 10 3/uL — ABNORMAL LOW (ref 1.5–7.7)
Platelets (HT): 256 10 3/uL (ref 140–400)
RBC (HT): 3.59 10 6/uL — ABNORMAL LOW (ref 3.80–5.20)
RDW (HT): 15.3 % — ABNORMAL HIGH (ref 11.5–15.0)
Seg Neut % (HT): 33.8 % — ABNORMAL LOW (ref 40.0–75.0)
WBC (HT): 3.5 10 3/uL — ABNORMAL LOW (ref 4.0–10.8)

## 2019-01-23 LAB — UNMAPPED LAB RESULTS
Basophil # (HT): 0 10 3/uL (ref 0.0–0.2)
Basophil % (HT): 0.4 % (ref 0.0–1.8)
Eosinophil # (HT): 0 10 3/uL (ref 0.0–0.5)
Eosinophil % (HT): 1.2 % (ref 0.0–7.0)
Hematocrit (HT): 32.4 % — ABNORMAL LOW (ref 34.0–47.0)
Hemoglobin (HGB) (HT): 10.8 g/dL — ABNORMAL LOW (ref 11.5–16.0)
Lymphocyte # (HT): 1.6 10 3/uL (ref 0.9–3.8)
Lymphocyte % (HT): 67.5 % — ABNORMAL HIGH (ref 17.0–44.0)
MCHC (HT): 33.3 g/dL (ref 32.0–36.0)
MCV (HT): 95 FL (ref 81.0–99.0)
Mean Corpuscular Hemoglobin (MCH) (HT): 31.7 pg (ref 26.0–34.0)
Monocyte # (HT): 0.3 10 3/uL (ref 0.2–1.0)
Monocyte % (HT): 13.2 % — ABNORMAL HIGH (ref 4.0–12.0)
Neutrophil # (HT): 0.4 10 3/uL — CL (ref 1.5–7.7)
Platelets (HT): 166 10 3/uL (ref 140–400)
RBC (HT): 3.41 10 6/uL — ABNORMAL LOW (ref 3.80–5.20)
RDW (HT): 16 % — ABNORMAL HIGH (ref 11.5–15.0)
Seg Neut % (HT): 17.7 % — ABNORMAL LOW (ref 40.0–75.0)
WBC (HT): 2.4 10 3/uL — ABNORMAL LOW (ref 4.0–10.8)

## 2019-01-27 LAB — UNMAPPED LAB RESULTS
Basophil # (HT): 0 10 3/uL (ref 0.0–0.2)
Basophil % (HT): 1 % (ref 0–3)
Eosinophil # (HT): 0 10 3/uL (ref 0.0–0.6)
Eosinophil % (HT): 1 % (ref 0–5)
Hematocrit (HT): 33 % — ABNORMAL LOW (ref 35–47)
Hemoglobin (HGB) (HT): 10.6 g/dL — ABNORMAL LOW (ref 12.0–16.0)
Lymphocyte # (HT): 1.5 10 3/uL (ref 1.0–4.8)
Lymphocyte % (HT): 45 % (ref 15–45)
MCHC (HT): 32.6 g/dL (ref 31.0–37.5)
MCV (HT): 98 fL (ref 80–100)
Mean Corpuscular Hemoglobin (MCH) (HT): 31.9 pg (ref 26.0–34.0)
Monocyte # (HT): 0.3 10 3/uL (ref 0.1–1.0)
Monocyte % (HT): 10 % (ref 0–15)
Neutrophil # (HT): 1.4 10 3/uL — ABNORMAL LOW (ref 1.8–8.0)
Platelets (HT): 167 10 3/uL (ref 150–450)
RBC (HT): 3.32 10 6/uL — ABNORMAL LOW (ref 3.80–5.20)
RDW (HT): 14.9 % (ref 0.0–15.2)
Seg Neut % (HT): 44 % — ABNORMAL LOW (ref 45–75)
WBC (HT): 3.3 10 3/uL — ABNORMAL LOW (ref 4.0–11.0)

## 2019-02-14 LAB — UNMAPPED LAB RESULTS
Basophil # (HT): 0 10 3/uL (ref 0.0–0.2)
Basophil % (HT): 1 % (ref 0–2)
Eosinophil # (HT): 0 10 3/uL (ref 0.0–5.0)
Eosinophil % (HT): 0 % (ref 0–7)
Hematocrit (HT): 33 % — ABNORMAL LOW (ref 34–47)
Hemoglobin (HGB) (HT): 11.1 g/dL — ABNORMAL LOW (ref 11.5–16.0)
Lymphocyte # (HT): 1.7 10 3/uL (ref 0.9–3.8)
Lymphocyte % (HT): 50 % — ABNORMAL HIGH (ref 17–44)
MCHC (HT): 33.3 g/dL (ref 32.0–36.0)
MCV (HT): 98.2 fL (ref 81.0–99.0)
Mean Corpuscular Hemoglobin (MCH) (HT): 32.7 pg (ref 26.0–34.0)
Monocyte # (HT): 0.5 10 3/uL (ref 0.2–1.0)
Monocyte % (HT): 16 % — ABNORMAL HIGH (ref 4–12)
Neutrophil # (HT): 1.1 10 3/uL — ABNORMAL LOW (ref 1.8–8.0)
Platelets (HT): 248 10 3/uL (ref 140–400)
RBC (HT): 3.39 10 6/uL — ABNORMAL LOW (ref 3.80–5.20)
RDW (HT): 16.3 % — ABNORMAL HIGH (ref 11.5–15.0)
Seg Neut % (HT): 31 % — ABNORMAL LOW (ref 40–75)
WBC (HT): 3.4 10 3/uL — ABNORMAL LOW (ref 4.0–10.8)

## 2019-07-08 LAB — UNMAPPED LAB RESULTS
Hematocrit (HT): 37 % (ref 34–47)
Hemoglobin (HGB) (HT): 12.3 g/dL (ref 11.5–16.0)
MCHC (HT): 33 g/dL (ref 32.0–36.0)
MCV (HT): 94.7 fL (ref 81.0–99.0)
Mean Corpuscular Hemoglobin (MCH) (HT): 31.2 pg (ref 26.0–34.0)
Platelets (HT): 218 10 3/uL (ref 140–400)
RBC (HT): 3.94 10 6/uL (ref 3.80–5.20)
RDW (HT): 12.7 % (ref 11.5–15.0)
WBC (HT): 4 10 3/uL (ref 4.0–10.8)

## 2019-07-29 DIAGNOSIS — I2693 Single subsegmental pulmonary embolism without acute cor pulmonale: Secondary | ICD-10-CM

## 2019-07-29 HISTORY — DX: Single subsegmental thrombotic pulmonary embolism without acute cor pulmonale: I26.93

## 2019-07-29 LAB — UNMAPPED LAB RESULTS
Basophil # (HT): 0 10 3/uL (ref 0.0–0.2)
Basophil % (HT): 1 % (ref 0–3)
Eosinophil # (HT): 0.1 10 3/uL (ref 0.0–0.6)
Eosinophil % (HT): 2 % (ref 0–5)
Hematocrit (HT): 37 % (ref 35–47)
Hemoglobin (HGB) (HT): 12 g/dL (ref 12.0–16.0)
Lymphocyte # (HT): 1.2 10 3/uL (ref 1.0–4.8)
Lymphocyte % (HT): 20 % (ref 15–45)
MCHC (HT): 32.3 g/dL (ref 31.0–37.5)
MCV (HT): 96 fL (ref 80–100)
Mean Corpuscular Hemoglobin (MCH) (HT): 31 pg (ref 26.0–34.0)
Monocyte # (HT): 0.6 10 3/uL (ref 0.1–1.0)
Monocyte % (HT): 10 % (ref 0–15)
Neutrophil # (HT): 4.2 10 3/uL (ref 1.8–8.0)
Platelets (HT): 191 10 3/uL (ref 150–450)
RBC (HT): 3.87 10 6/uL (ref 3.80–5.20)
RDW (HT): 12.7 % (ref 0.0–15.2)
Seg Neut % (HT): 68 % (ref 45–75)
WBC (HT): 6.2 10 3/uL (ref 4.0–11.0)

## 2019-07-30 LAB — UNMAPPED LAB RESULTS
Hematocrit (HT): 35 % (ref 35–47)
Hemoglobin (HGB) (HT): 11.5 g/dL — ABNORMAL LOW (ref 12.0–16.0)
MCHC (HT): 32.5 g/dL (ref 31.0–37.5)
MCV (HT): 96 fL (ref 80–100)
Mean Corpuscular Hemoglobin (MCH) (HT): 31.1 pg (ref 26.0–34.0)
Platelets (HT): 204 10 3/uL (ref 150–450)
RBC (HT): 3.7 10 6/uL — ABNORMAL LOW (ref 3.80–5.20)
RDW (HT): 12.8 % (ref 0.0–15.2)
WBC (HT): 4.3 10 3/uL (ref 4.0–11.0)

## 2019-07-30 NOTE — Discharge Summary (Signed)
-------------------------------------------------------------------------------  Attestation signed by Julie Feinstein, MD at 07/30/2019  1:08 PM  Billing Attending's Addendum and Attestation  I personally evaluated the patient, and contributed to the care plan today.  I agree with the historical, examination, data review findings, and assessment and plan in above note by my resident/midlevel-provider/partner with the applicable key modifications and addendums noted:     History:  Seen and examined patient at bedside this morning.  Patient complains of urinary frequency, dysuria.  She also complains of pleuritic chest pain  Patient wants to go home today.    Exam:  Patient is alert, oriented, comfortably laying in bed  Abdomen soft, nontender  Extremities; no edema's  A skin; no new rash  Respiratory; no respiratory distress  CVS; normal heart rate and rhythm    Data review and decision making:  Agree with hospital stay summary outlined below  Patient presenting with acute PE.  She needs lifelong anticoagulation.  Dosing outlined below  Patient also has symptoms of UTI.  PCP to follow urine culture.  Discharged with p.o. Keflex      .    For billing purposes: for time based billing: I spent more than 35 minutes with this patient's care on the floor today and more than half of that time was spent with counseling and coordination of care, as described in my note (Resident teaching is excluded from this time reporting. Time spent by PA/NP providers directly supervised by the attending and employed by the attending physician's group is included in this time reporting). Julie Feinstein, MD  07/30/2019  1:07 PM   Visit time (if different):     -------------------------------------------------------------------------------    MEDICINE DISCHARGE SUMMARY   Admit Date: 07/29/2019 Disch Date: 07/30/2019 LOS: 1.05 days   Attending PCP Hosp Dept   Julie Feinstein, MD Julie Shave, MD [facility] [address]     HAND OFF  SUMMARY   This section is a brief synopsis of the hospitalization for quick review only. Please refer to the Hospital course section for detailed summary.   REASON AND OUTCOME OF HOSPITALIZATION:   Acute pulmonary embolism   CHANGES IN CHRONIC MANAGEMENT:   1. Eliquis 10 mg BID for 7 days followed by 5 mg BID  2. Keflex for 5 days    FOLLOW UP ISSUES FOR PCP:   1. Follow urine culture   2. Thyroid US outpatient    IMPORTANT LABS, IMAGING AND PROCEDURES:   Result Review <redacted file path>  CT Abdomen/Pelvis:   1.  No evidence of metastatic disease.  2.  Mild bowel wall thickening of the sigmoid colon, findings palpable with colitis/diverticulitis.    CT Chest:   1.  Acute pulmonary embolus at the bifurcation of the left lower lobe segmental arteries. No evidence of right heart strain.  2.  Skin thickening of the left inframammary fold, correlate clinically for infection.  3.  Hypodense lesion within the right hemithyroid measuring 1.4 cm, increased from previously measuring 0.9 cm. Further evaluation with nonemergent ultrasound is recommended.  TESTS ORDERED ON DISCHARGE:   None      PENDING TESTS AT TIME OF DISCHARGE:   PENDING TESTS FROM HOSPITAL STAY (if any): None   DISCHARGE DISPOSITION:   home   HOSPITAL COURSE   Problem list <redacted file path>  Active Hospital Problems    Diagnosis    . PRINCIPAL PROBLEM: Single subsegmental pulmonary embolism without acute cor pulmonale (CMS HCC Code)  Resolved Hospital Problems   No resolved problems to display.       Detailed Problem Based Hospital Summary:    This is a 69 year old female with history of ovarian cancer s/p TAH on Letrozole, COPD, DVT/PE recently taken off Eliquis, GERD, fibromyalgia, and depression who presented with shortness of breath and chest pain.     Acute PE:  Patient has history of DVT/PE for which she had been on Eliquis but recently discontinued and only prescribed prn for travel. She recently traveled from [place] a few weeks ago and had  taken Eliquis for that trip. She was found to have acute PE at bifurcation of left lower lobe segmental arteries. No evidence of right heart strain and she is hemodynamically stable. Initially managed with subcutaneous Lovenox and then transitioned to Eliquis. She is oxygenating well on room air.  -Eliquis 10 mg BID for 7 days followed by 5 mg BID thereafter    UTI:  UA pending. Started on Keflex.  -Keflex 250 mg BID  -Follow urine culture     Right thyroid lesion:  1.4 cm lesion within right hemithyroid incidentally noted on CT.  -Thyroid US outpatient     *Husband updated at bedside     Based on the patients co-morbid conditions, severity of symptoms and acuity of initial presentation with acute PE with risk factors for decompensation including age chronic lung disease, and hx of cancer, there was an initial expectation of a length of stay in excess of 2 midnights prompting an inpatient admission. However patient has since recovered earlier than expected and was discharged prior to 2 midnights. As a result I still believe inpatient status is appropriate for this visit.    CODE status: Full Code    After Visit Summary   For Patient/ Family Instructions, follow-up appointments and Discharge Medications, please refer to After Visit Summary (AVS) <redacted file path>      DISCHARGE DAY SERVICE   INTERVAL HISTORY:   Patient reports having urinary frequency and dysuria today, thinks she has a UTI. Shortness of breath and chest pain have resulted. No fever, cough, lightheadedness, abdominal pain.    PHYSICAL EXAMINATION:   Vitals:    07/29/19 1748 07/29/19 2129 07/30/19 0244 07/30/19 0607   BP: 130/70 (!) 138/96 108/66 108/77   Pulse: 74 83 77 77   Resp: 18 18 18 18    Temp: 36.7 C (98 F) 35.9 C (96.7 F) 36 C (96.8 F) 36.5 C (97.7 F)   TempSrc: Oral Temporal Temporal Temporal   SpO2: 94% 93% 93% 92%   Weight:       Height:         Estimated body mass index is 26.37 kg/m as calculated from the following:    Height  as of this encounter: 1.524 m (5').    Weight as of this encounter: 61.2 kg (135 lb).   Physical Exam  Constitutional:       General: She is not in acute distress.     Appearance: She is well-developed. She is not diaphoretic.   HENT:      Head: Normocephalic and atraumatic.   Cardiovascular:      Rate and Rhythm: Normal rate and regular rhythm.      Heart sounds: Normal heart sounds. No murmur heard.   No friction rub. No gallop.    Pulmonary:      Effort: Pulmonary effort is normal. No respiratory distress.      Breath sounds: No wheezing, rhonchi or  rales.   Abdominal:      General: Bowel sounds are normal. There is no distension.      Palpations: Abdomen is soft.      Tenderness: There is no abdominal tenderness. There is no guarding or rebound.   Skin:     General: Skin is warm and dry.   Neurological:      Mental Status: She is alert and oriented to person, place, and time.          Functional status back to baseline? yes  Cognitive status back to baseline? yes       Electronically signed by:  Artelia Laroche, PA-C  07/30/2019 10:56 AM      Collapsed details   PROVIDER STATEMENT:   I counseled the patient and/or caretaker regarding the hospital course, follow up issues, and answered all related questions. Please refer to AVS for a summary that I created/supervised for details.   My (team's) hand-off communication with the primary care / continuity provider included:      Marland Kitchen   I spent >30 minutes on the floor on the discharge day, managing the patient's care and organizing the discharge.

## 2019-07-30 NOTE — Progress Notes (Signed)
Transitions of Care: Discharge Note    Discharge Plan: Patient expects to be discharged to:: home    Discharge Date and Time: 07/30/2019      Main family Contact and Number:   Name of person provided with discharge information : pt        Discharge Coordination       Ride Arranged: No  Pt spouse will take her home    Patient will need Home Services? : Not applicable           Note: Patient provided listings of all certified homecare agencies/nursing homes in her area contracted with her insurance.     Referral To:          DME/Oxygen needed for discharge?: NA          ** Follow-up appointment visible on AVS: Yes     Coordinated with: Care Manager, Specialist       Patient's plan to pick up discharge medications:   Pt to [facility] meds at [facility] [place].  After talking with pt she has a fair amount of eliquis 2.5 mg at home which she paid a high copay for. She will plan to use this up and then work w/ her PCP on a 5 mg script.  She knows that she has to take 10 mg BID for 7 days and then decrease to 5 mg BID and she feels very confident in her ability to set this up using her pill box and be able to successfully take.  She [facility].    Narrative: Clinical research associate meet w/ pt and spouse and she is dressed and eager for discharge. Her spouse provides transportation as needed to appts.  She is independent w/o a device.  She will call her PCP and set up a follow up.  She has no home care needs.    Valma Cava, RN  Phone #: [phone]  07/30/2019 3:03 PM

## 2019-08-06 DIAGNOSIS — E041 Nontoxic single thyroid nodule: Secondary | ICD-10-CM | POA: Insufficient documentation

## 2019-09-12 LAB — UNMAPPED LAB RESULTS
Basophil # (HT): 0 10 3/uL (ref 0.0–0.2)
Basophil % (HT): 1 % (ref 0–3)
Eosinophil # (HT): 0.1 10 3/uL (ref 0.0–0.6)
Eosinophil % (HT): 3 % (ref 0–5)
Hematocrit (HT): 43 % (ref 35–47)
Hemoglobin (HGB) (HT): 13.6 g/dL (ref 12.0–16.0)
Lymphocyte # (HT): 1.3 10 3/uL (ref 1.0–4.8)
Lymphocyte % (HT): 29 % (ref 15–45)
MCHC (HT): 32 g/dL (ref 31.0–37.5)
MCV (HT): 97 fL (ref 80–100)
Mean Corpuscular Hemoglobin (MCH) (HT): 31 pg (ref 26.0–34.0)
Monocyte # (HT): 0.6 10 3/uL (ref 0.1–1.0)
Monocyte % (HT): 13 % (ref 0–15)
Neutrophil # (HT): 2.4 10 3/uL (ref 1.8–8.0)
Platelets (HT): 188 10 3/uL (ref 150–450)
RBC (HT): 4.39 10 6/uL (ref 3.80–5.20)
RDW (HT): 12.4 % (ref 0.0–15.2)
Seg Neut % (HT): 55 % (ref 45–75)
WBC (HT): 4.4 10 3/uL (ref 4.0–11.0)

## 2019-10-17 HISTORY — PX: COLONOSCOPY: SHX174
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
## 2020-07-19 LAB — UNMAPPED LAB RESULTS
Basophil # (HT): 0 10 3/uL (ref 0.0–0.2)
Basophil % (HT): 1 % (ref 0–2)
Eosinophil # (HT): 0.1 10 3/uL (ref 0.0–0.5)
Eosinophil % (HT): 3 % (ref 0–7)
Hematocrit (HT): 41 % (ref 34–47)
Hemoglobin (HGB) (HT): 13.9 g/dL (ref 11.5–16.0)
Lymphocyte # (HT): 1.5 10 3/uL (ref 0.9–3.8)
Lymphocyte % (HT): 34 % (ref 17–44)
MCHC (HT): 33.9 g/dL (ref 32.0–36.0)
MCV (HT): 93.2 fL (ref 81.0–99.0)
Mean Corpuscular Hemoglobin (MCH) (HT): 31.6 pg (ref 26.0–34.0)
Monocyte # (HT): 0.4 10 3/uL (ref 0.2–1.0)
Monocyte % (HT): 9 % (ref 4–12)
Neutrophil # (HT): 2.4 10 3/uL (ref 1.5–7.7)
Platelets (HT): 232 10 3/uL (ref 140–400)
RBC (HT): 4.4 10 6/uL (ref 3.80–5.20)
RDW (HT): 12.9 % (ref 11.5–15.0)
Seg Neut % (HT): 53 % (ref 40–75)
WBC (HT): 4.5 10 3/uL (ref 4.0–10.8)

## 2020-08-18 DIAGNOSIS — M62838 Other muscle spasm: Secondary | ICD-10-CM

## 2020-08-18 HISTORY — DX: Other muscle spasm: M62.838

## 2020-10-15 ENCOUNTER — Other Ambulatory Visit: Payer: Self-pay | Admitting: Gastroenterology

## 2020-11-24 ENCOUNTER — Other Ambulatory Visit: Payer: Self-pay | Admitting: Gastroenterology

## 2020-11-24 ENCOUNTER — Inpatient Hospital Stay: Admit: 2020-11-24 | Discharge: 2020-11-24 | Disposition: A | Discharge status: Home / Self Care | Payer: Self-pay

## 2020-11-24 IMAGING — MR MRI PELVIS W/WO CONTRAST
15 series · 48 of 48 positions shown · IV contrast (gadavist)
Comparison: None

________________________________________________________________________________________________ 
MRI PELVIS W/WO CONTRAST, 11/24/2020 [DATE]: 
CLINICAL INDICATION: History of ovarian carcinoma rule out recurrent disease. 
Right lower quadrant pain. Previous hysterectomy, appendectomy and colon 
surgery.
TECHNIQUE: Multiplanar, multiecho position MR images of the pelvis were 
performed without and with 8 mL of intravenous Gadavist Patient was scanned on a 
1.5T magnet.

[Series 101: survey · axial · 15.0mm · 1.76mm/px · 1 of 11 slices shown]
[im 1/11]
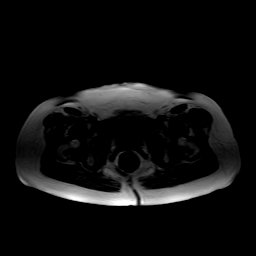

[Series 201: t2_sag_uterus mvxd_hr_ · sagittal · 3.5mm · 0.68mm/px · 2 of 32 slices shown]
[im 1/32]
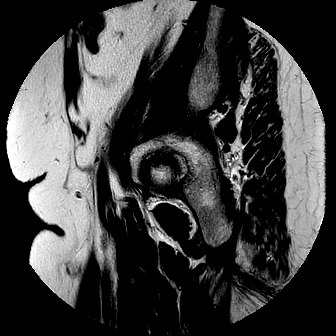
[im 32/32]
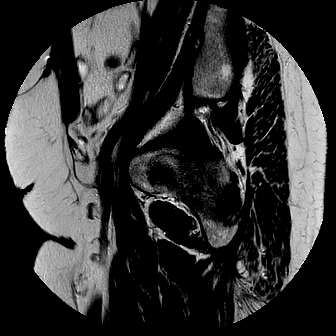

[Series 301: t1w_tse_ax · axial · 4.0mm · 0.66mm/px · z∈[-64,+66]mm · 3 of 27 slices shown]
[im 1/27]
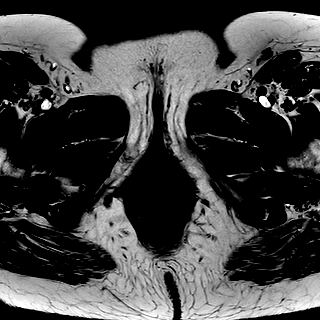
[im 14/27]
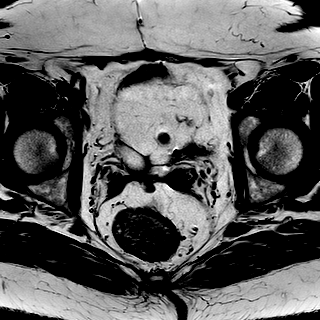
[im 27/27]
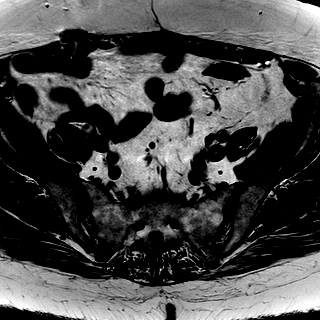

[Series 401: t2_ax_uterus mvxd_hr_ · axial · 4.0mm · 0.68mm/px · z∈[-67,+70]mm · 3 of 32 slices shown]
[im 1/32]
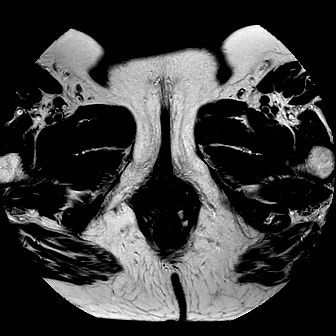
[im 16/32]
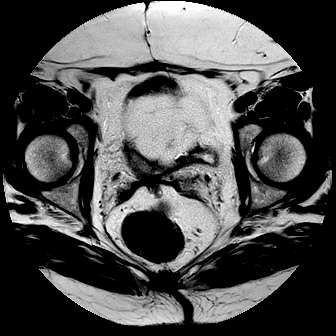
[im 32/32]
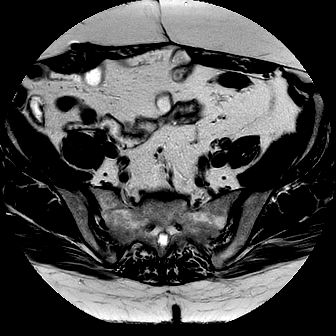

[Series 501: t2_cor_uterus mvxd_hr · coronal · 4.0mm · 0.68mm/px · 3 of 32 slices shown]
[im 1/32]
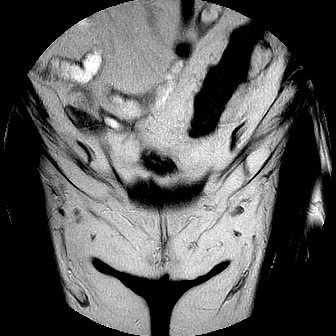
[im 16/32]
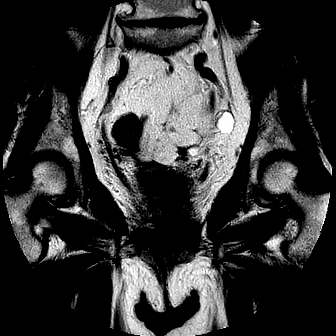
[im 32/32]
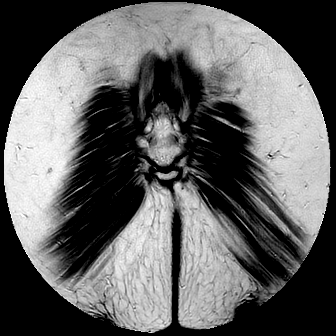

[Series 601: t1w/sp ax · axial · 4.0mm · 0.66mm/px · z∈[-64,+66]mm · 3 of 27 slices shown]
[im 1/27]
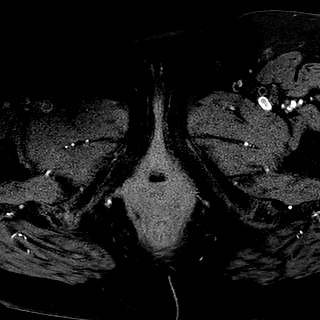
[im 14/27]
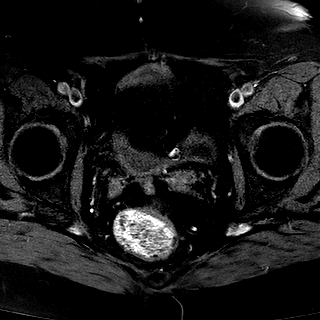
[im 27/27]
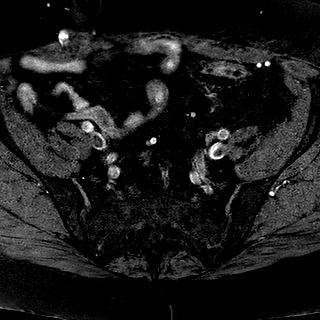

[Series 701: r dwi_(person_name)_3b · axial · 3.0mm · 1.25mm/px · z∈[-45,+48]mm · 6 of 64 slices shown]
[im 1/64]
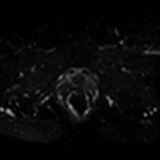
[im 13/64]
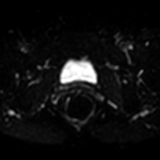
[im 26/64]
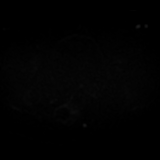
[im 38/64]
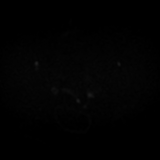
[im 51/64]
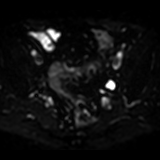
[im 64/64]
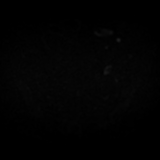

[Series 702: DWI · axial · 3.0mm · 1.25mm/px · z∈[-45,+48]mm · 6 of 64 slices shown]
[im 1/64]
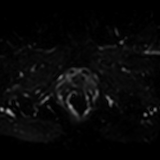
[im 13/64]
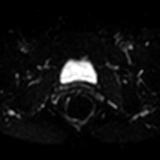
[im 26/64]
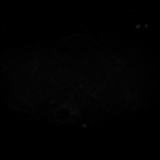
[im 38/64]
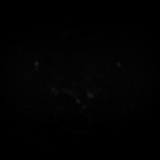
[im 51/64]
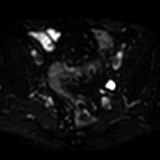
[im 64/64]
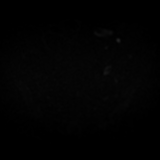

[Series 703: dadc · axial · 3.0mm · 1.25mm/px · z∈[-45,+48]mm · 3 of 31 slices shown]
[im 1/31]
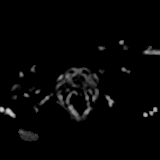
[im 16/31]
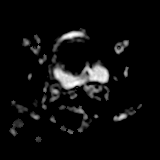
[im 31/31]
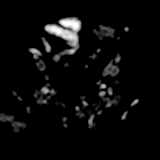

[Series 704: sb0 · axial · 3.0mm · 1.25mm/px · z∈[-45,+48]mm · 3 of 32 slices shown]
[im 1/32]
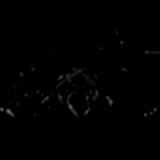
[im 16/32]
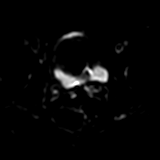
[im 32/32]
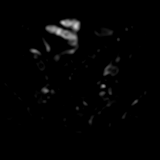

[Series 705: (id) · axial · 3.0mm · 1.25mm/px · z∈[-45,+48]mm · 3 of 32 slices shown]
[im 1/32]
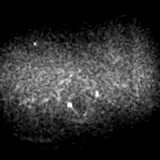
[im 16/32]
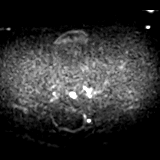
[im 32/32]
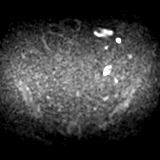

[Series 706: dadc large fov · axial · 3.0mm · 1.25mm/px · z∈[-45,+48]mm · 3 of 32 slices shown]
[im 1/32]
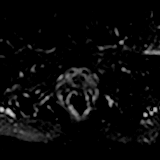
[im 16/32]
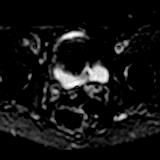
[im 32/32]
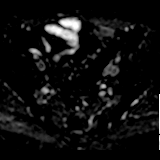

[Series 801: (person_name)1(person_name)/(person_name) · axial · 4.0mm · 0.66mm/px · z∈[-64,+66]mm · 3 of 27 slices shown]
[im 1/27]
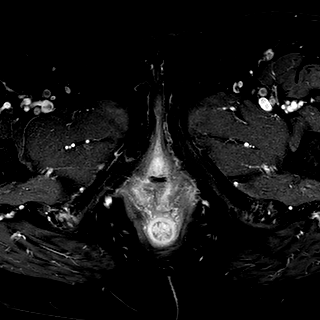
[im 14/27]
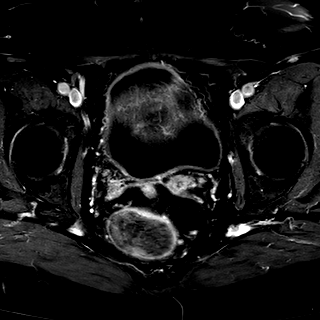
[im 27/27]
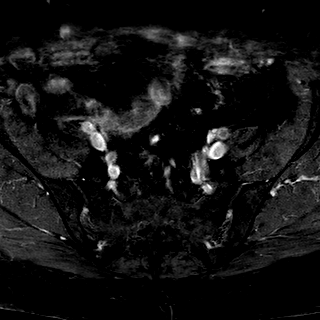

[Series 901: (person_name)1/sp cor · coronal · 4.0mm · 0.43mm/px · 3 of 27 slices shown]
[im 1/27]
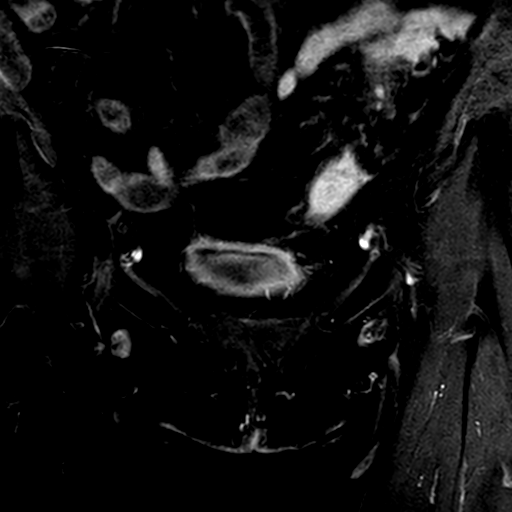
[im 14/27]
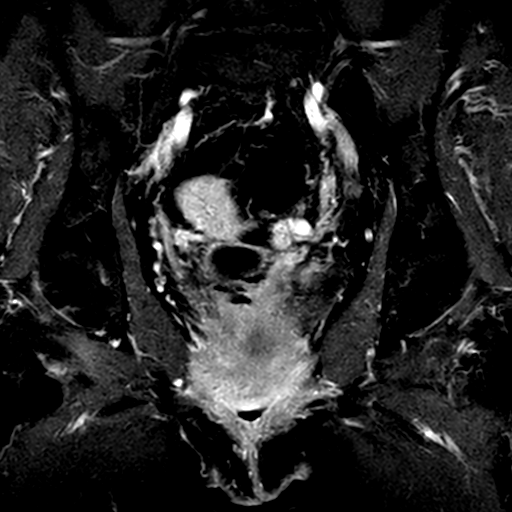
[im 27/27]
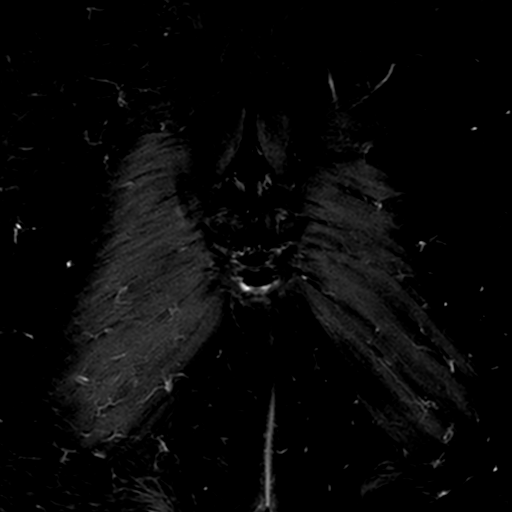

[Series 1001: (person_name)1 sag sp · sagittal · 4.0mm · 0.38mm/px · 3 of 28 slices shown]
[im 1/28]
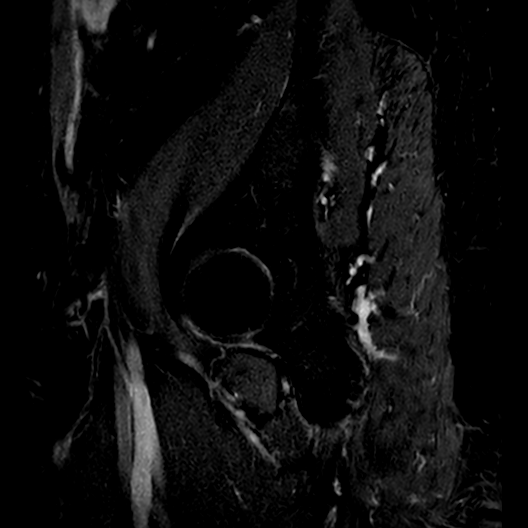
[im 14/28]
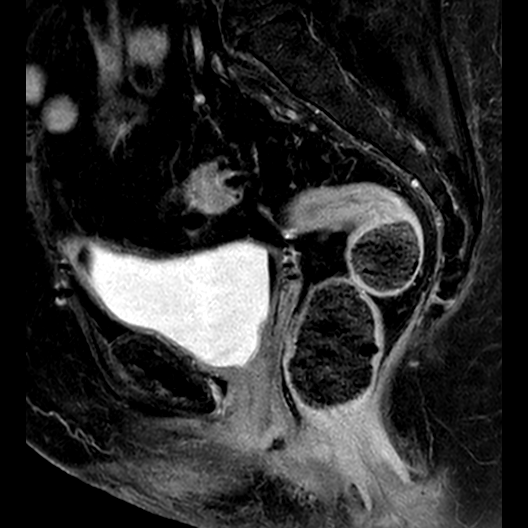
[im 28/28]
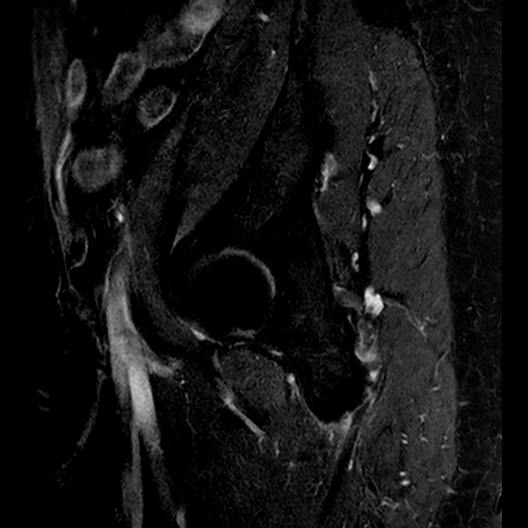

[48 of 48 positions shown; findings below may reference images not displayed]

FINDINGS: There is a cystic structure seen within the left adnexa. Overall measures 13 mm 
in greatest diameter on the postcontrast T1 axial image. Best seen on image 8. 
This does have a enhancing wall. This is more prominent medially where it 
measures up to 3 mm in thickness. However, no nodularity. The entire enhancing 
wall is smooth in appearance. Exact etiology and significance uncertain. 
Uncertain if this is mild residual ovarian or adnexal tissue with cyst. No 
further focal abnormality seen. There is no adenopathy. No free fluid. No mass 
seen in the region of the vaginal fornix. Bladder is unremarkable appearance. 
Diverticulosis. Degenerative changes seen in the spine.
IMPRESSION: Nonspecific cyst with wall enhancement asymmetric thickness medially. Very 
nonspecific. Would consider short-term follow-up initially with sonography. If 
this is visualized could then be followed with sonography. Otherwise would 
consider a repeat MRI in 4 months.

## 2021-05-13 ENCOUNTER — Encounter: Payer: Self-pay | Admitting: Gastroenterology

## 2021-05-26 ENCOUNTER — Other Ambulatory Visit: Payer: Self-pay | Admitting: Gastroenterology

## 2021-05-26 IMAGING — CT CT ABDOMEN AND PELVIS WITH IV AND ORAL CONT
2 of 3 series · 9 of 46 positions shown, 10 images · IV contrast (Iodine)
Comparison: Comparison was made to the prior exam(s) within the last 12 months 
11/24/2020 MRI pelvis.

________________________________________________________________________________________________ 
CT ABDOMEN AND PELVIS WITH IV AND ORAL CONT, 05/26/2021 [DATE]: 
CLINICAL INDICATION: RLQ PAIN, ELEVATED XF-QE3. History of ovarian cancer. 
A search for DICOM formatted images was conducted for prior CT imaging studies 
completed at a non-affiliated media free facility.
TECHNIQUE: The abdomen and pelvis were scanned from lung bases through the 
pubic rami with 100 mL of Isovue 300 contrast on a high-resolution CT scanner 
using dose reduction techniques. Patient drank diluted oral contrast for this 
study. Routine MPR reconstructions were performed. The patients eGFR was 
calculated to be 37.0 mL/min/1.73 m2 using the i-STAT device.

[Series 401: axial w, idose (3) · axial · 0.88mm/px · z∈[+703,+1081]mm · 6 of 158 slices shown, 7 images]
[im 16/158  soft-tissue]
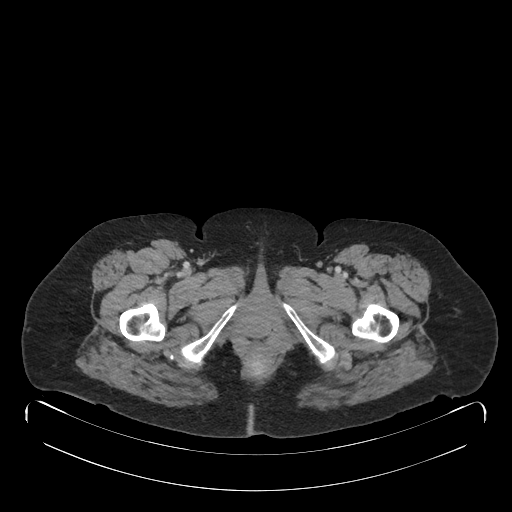
[im 16/158  bone]
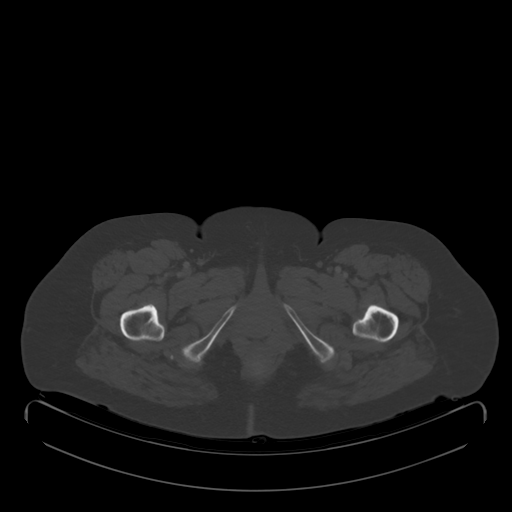
[im 41/158  soft-tissue]
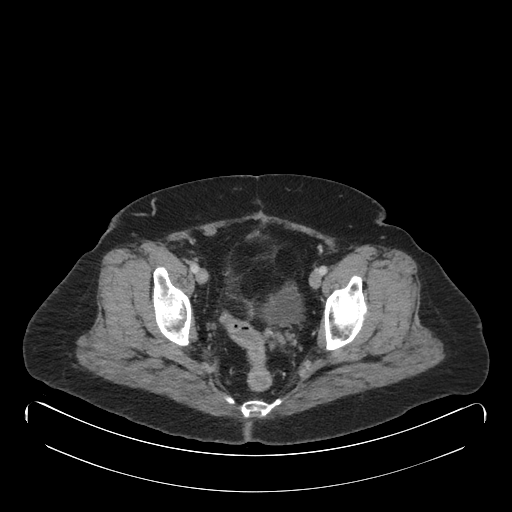
[im 66/158  soft-tissue]
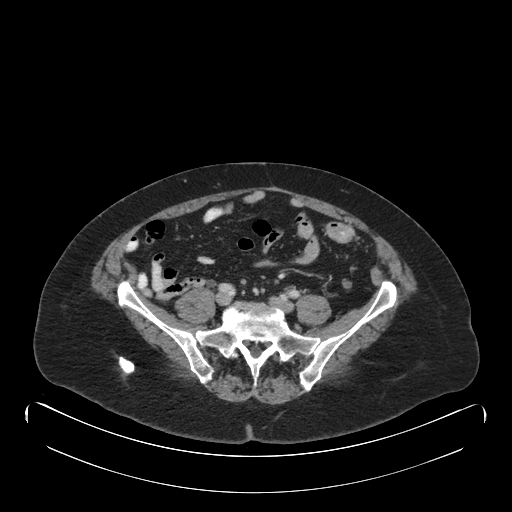
[im 92/158  soft-tissue]
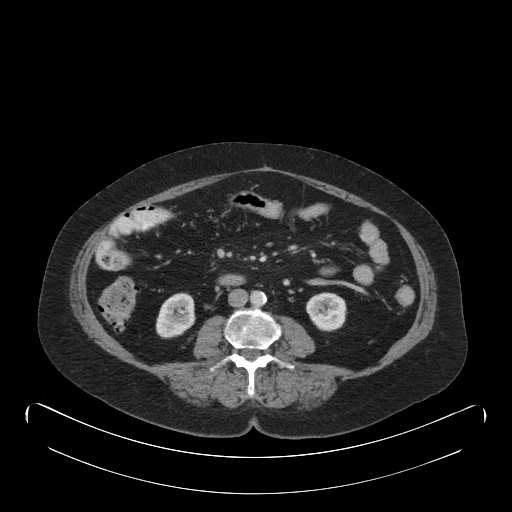
[im 117/158  soft-tissue]
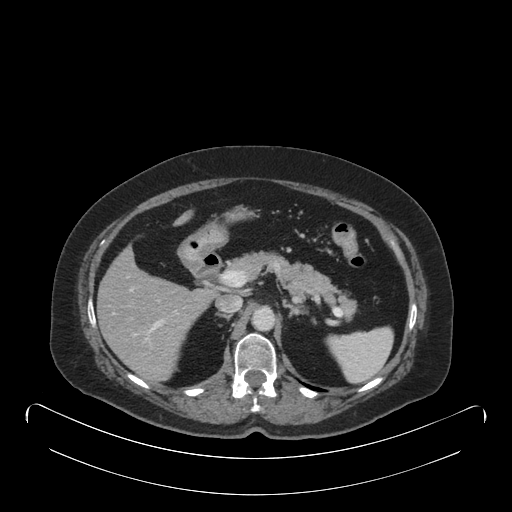
[im 142/158  soft-tissue]
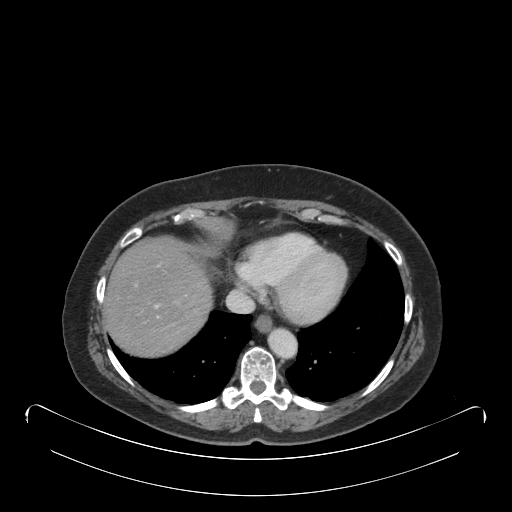

[Series 402: coronal w, idose (3) · coronal · 0.45mm/px · 3 of 106 slices shown]
[im 36/106  soft-tissue]
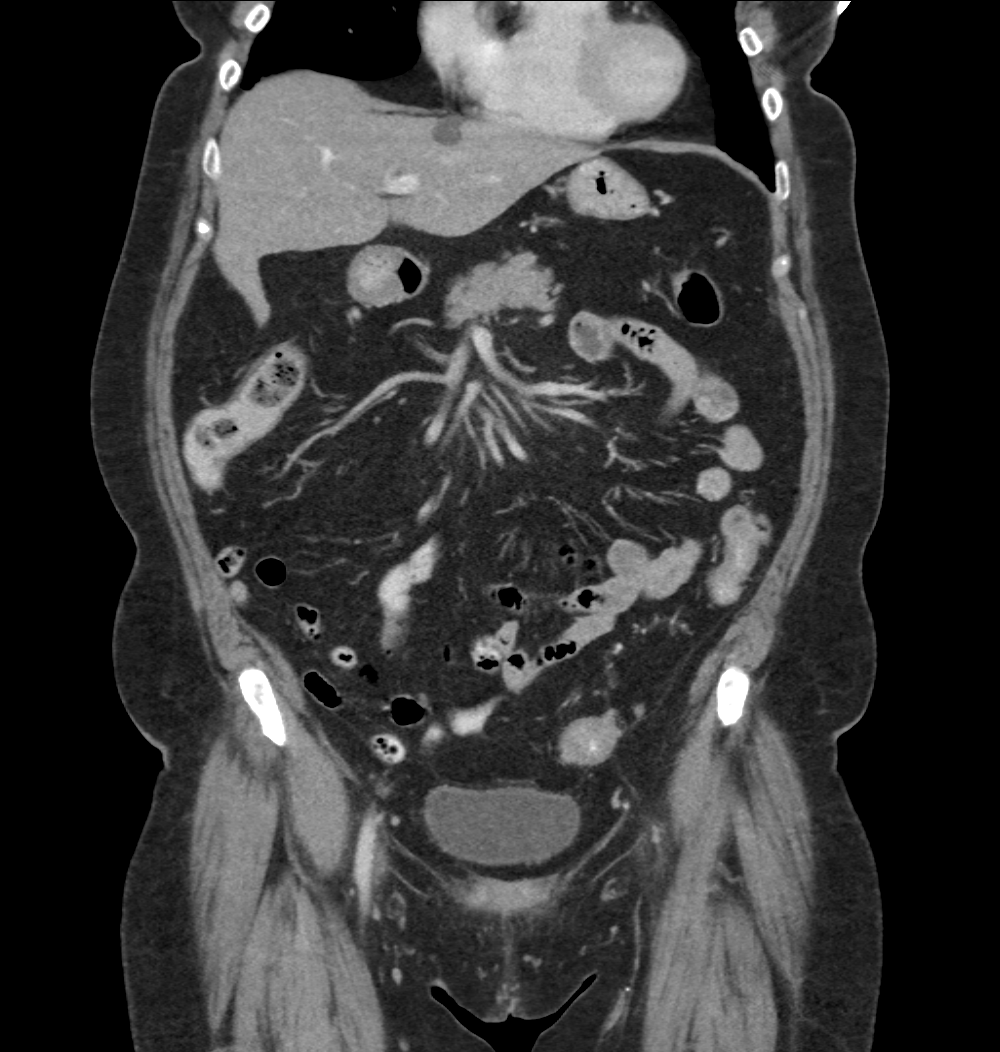
[im 47/106  soft-tissue]
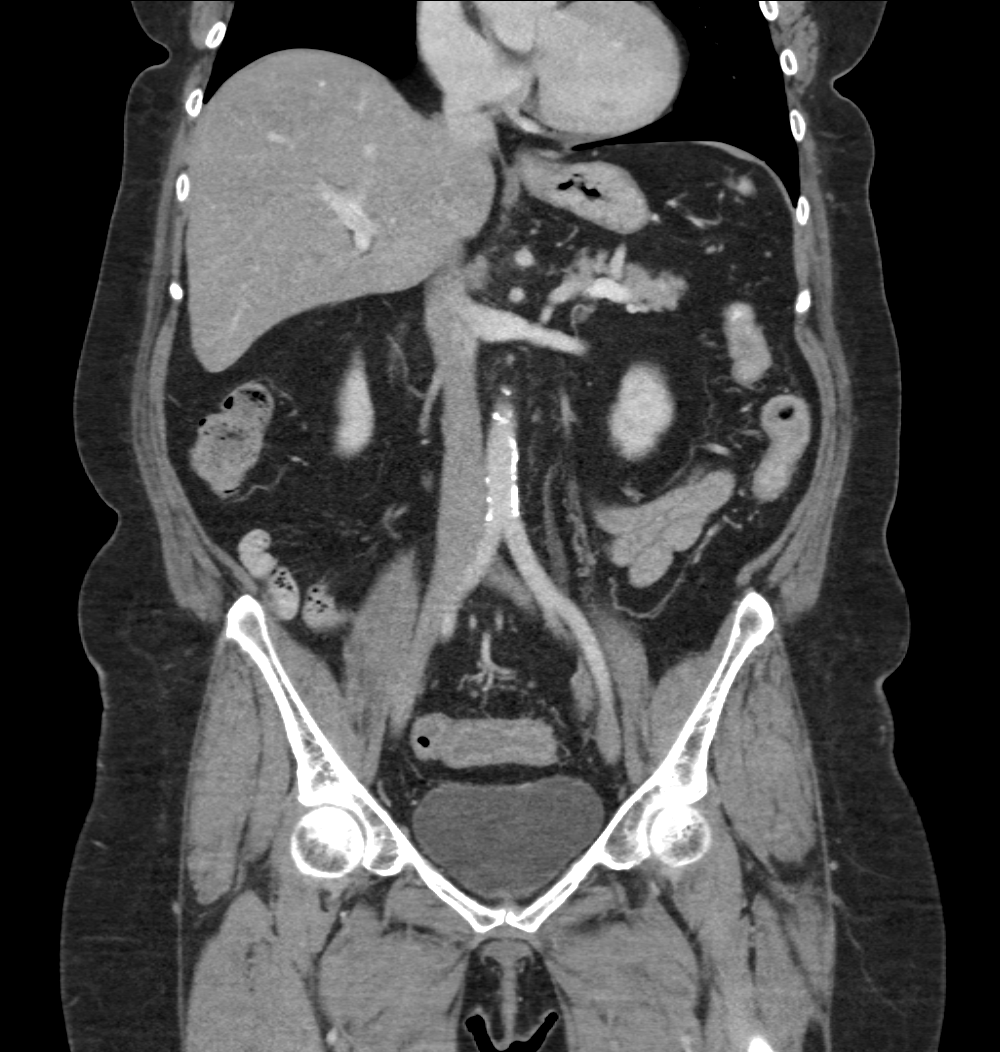
[im 59/106  soft-tissue]
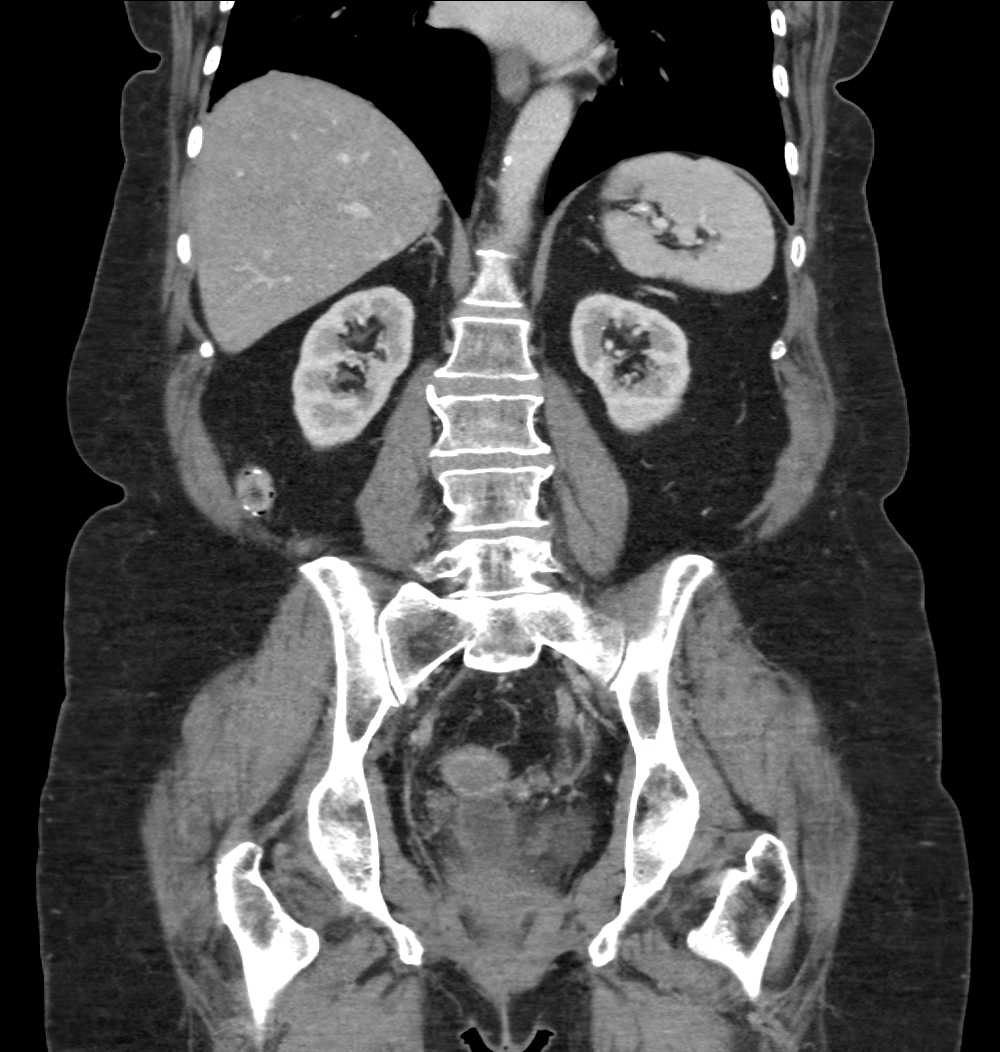

[9 of 46 positions shown; findings below may reference images not displayed]

FINDINGS: LUNG BASES: Lung bases are clear. No pleural effusions. 
HEPATOBILIARY: Images show no mass or biliary dilatation. 1.8 x 1.1 cm septated 
left hepatic cyst. Cholecystectomy. 
SPLEEN: Normal in size. 
PANCREAS: No evidence for ductal dilatation or mass.   
ADRENALS: No mass. 
GENITOURINARY: No hydronephrosis or kidney stones. Bladder is unremarkable. 
Enlargement of the left adnexa, which now measures 2.5 x 2.5 x 2.5 cm and is 
cystic (sequence 401 image 112). Hysterectomy. 
LYMPH NODES: No adenopathy. 
STOMACH, SMALL BOWEL AND COLON: Small hiatal hernia. Partial right 
hemicolectomy, appendectomy and proximal transverse colonic resection staples. 
No mass or obstruction. Colonic diverticulosis without evidence of 
diverticulitis. 
VASCULAR STRUCTURES: No aneurysm. Atherosclerosis. 
MUSCULOSKELETAL: Mild chronic T12 superior endplate compression fracture, 
degenerative change and osteopenia.  
ADDITIONAL FINDINGS: 0.8 x 0.5 x 0.9 cm left pelvic mesenteric nodule (sequence 
401 image 93, sequence 4 2 image 44). No right lower quadrant inflammatory 
change or fluid collections.
IMPRESSION: 1.  2.5 x 2.5 x 2.5 cm enlarging cystic left adnexa and 0.8 x 0.5 x 0.9 cm left 
pelvic mesenteric nodule in this patient with history of ovarian carcinoma and 
elevated tumor markers: Findings are worrisome for recurrence. Pelvic MRI or 
PET/CT may be helpful for further evaluation, if clinically indicated. 
2.  1.8 x 1.1 cm septated left hepatic cyst.  
3.  Cholecystectomy. 
4.  Small hiatal hernia.  
5.  Partial right hemicolectomy, appendectomy and proximal transverse colonic 
resection staples. Colonic diverticulosis. 
6.  Atherosclerosis. 
7.  Mild chronic T12 superior endplate compression fracture, degenerative change 
and osteopenia.  
RADIATION DOSE REDUCTION: All CT scans are performed using radiation dose 
reduction techniques, when applicable.  Technical factors are evaluated and 
adjusted to ensure appropriate moderation of exposure.  Automated dose 
management technology is applied to adjust the radiation doses to minimize 
exposure while achieving diagnostic quality images.

## 2021-06-06 LAB — UNMAPPED LAB RESULTS
Hematocrit (HT): 41 % (ref 34–47)
Hemoglobin (HGB) (HT): 13.5 g/dL (ref 11.5–16.0)
MCHC (HT): 33.3 g/dL (ref 32.0–36.0)
MCV (HT): 93.3 fL (ref 81.0–99.0)
Mean Corpuscular Hemoglobin (MCH) (HT): 31.1 pg (ref 26.0–34.0)
Platelets (HT): 212 10 3/uL (ref 140–400)
RBC (HT): 4.34 10 6/uL (ref 3.80–5.20)
RDW (HT): 12.6 % (ref 11.5–15.0)
WBC (HT): 4.3 10 3/uL (ref 4.0–10.8)

## 2021-06-06 NOTE — Progress Notes (Addendum)
Subjective      Name Julie Walsh MRN [id number]    DOB [birth year] AGE/SEX 71 y.o./ female     Chief Complaint Chief Complaint   Patient presents with   . Medication Management     med check f/u from 08/18/20 LOV started alendronate 70 mg weekly for osteoporosis and cyclobenzaprine 5 mg q8 hr for neck spams ; no labs ordered    . Pain     Per pt all over had a test done in [facility] will like to discuss w/ME    . Health Screen     Screenings done PHQ9=7         Visit History for  06/06/2021   Problem List <redacted file path>* Health Maintenance <redacted file path>* Results/Data <redacted file path>*  Review Flowsheets <redacted file path>*   History of Present Illness  HPI   Hurts all the time  Last night hurt her right low back radiating to right leg  She was told that her kidney function wasn't good. Told by radiology  She was having a CT abdomen for diarrhea  Colonoscopy 10/17/19 chronic colitis associated with diverticulum. She was treated with mesalamine. Diarrhea has gotten worse. Some blood as well         ROS        Medications <redacted file path> and  Allergies <redacted file path>   Outpatient Medications Marked as Taking for the 06/06/21 encounter (Office Visit) with Gifford Shave, MD   Medication Sig Dispense Refill   . acetaminophen 650 MG Oral CR tablet Take 2 tablets by mouth 2 (two) times a day. 90 tablet 1   . ALENDRONATE 70 MG Oral tablet TAKE 1 TABLET BY MOUTH ONE TIME EVERY 7 DAYS 12 tablet 3   . cholecalciferol, Vitamin D3, 2,000 unit Oral Cap Take 1 capsule by mouth daily. 30 capsule 11   . cyclobenzaprine 5 MG Oral tablet Take 1 tablet by mouth every 8 (eight) hours as needed for Muscle spasms. 30 tablet 0   . GABAPENTIN 300 MG Oral capsule TAKE 1 CAPSULE BY MOUTH 2 (TWO) TIMES DAILY AS NEEDED. 180 capsule 3   . letrozole 2.5 mg Oral tablet Take 1 tablet by mouth daily. 90 tablet 3   . MULTIVIT 33-MTFOLATE-NAC-CHROM ORAL Take  by mouth.     Marland Kitchen PANTOPRAZOLE 40 MG Oral tablet TAKE 1 TABLET BY  MOUTH EVERY DAY 90 tablet 3   . traZODone 100 MG Oral tablet TAKE 2 TABLETS BY MOUTH AT BEDTIME 180 tablet 3   . [DISCONTINUED] ELIQUIS 5 mg Oral Tab tab TAKE 1 TABLET BY MOUTH TWICE A DAY 180 tablet 3     Allergies   Allergen Reactions   . Bleach (Sodium Hypochlorite) Rash   . Feldene [Piroxicam] Rash       I have reviewed the above medications and allergies    Objective:     Vitals  Modify Vitals <redacted file path>  height is 4' 11.84" (1.52 m) and weight is 158 lb 14.4 oz (72.1 kg). Her blood pressure is 125/80 and her pulse is 80. Her oxygen saturation is 97%.  Body mass index is 31.2 kg/m.       Physical Exam  Vitals and nursing note reviewed.   Constitutional:       Appearance: Normal appearance.   Cardiovascular:      Rate and Rhythm: Normal rate and regular rhythm.      Heart sounds: No murmur heard.  Pulmonary:  Effort: Pulmonary effort is normal.      Breath sounds: Normal breath sounds.   Abdominal:      General: Abdomen is flat. Bowel sounds are normal.      Palpations: Abdomen is soft.      Tenderness: There is no abdominal tenderness.   Musculoskeletal:      Right lower leg: No edema.      Left lower leg: No edema.   Neurological:      Mental Status: She is alert.                 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 Ubaldo Glassing and NP Clemmie Krill.  Patient says she was treated with mesalamine but had an adverse reaction and has not been taking it.  If anything, she is gotten worse over the past year and a half.  I would like her evaluated again for some alternative treatment.         Relevant Orders    Ambulatory Referral to Gastroenterology    CBC    Comprehensive  Metabolic Panel    History of DVT (deep vein thrombosis)    Single subsegmental pulmonary embolism without acute cor pulmonale (CMS HCC Code)     Continue long-term anticoagulation with Eliquis 5 mg twice daily.  Check CBC and CMP         Relevant Medications    apixaban (ELIQUIS) 5 mg Oral tab   Other Visit Diagnoses     Hyperglycemia        Relevant Orders    Hemoglobin A1C                      Follow up  recommended <redacted file path> Return in about 1 month (around 07/07/2021) for Wellness, Physical.     Patient verbalized understanding of the above instructions.     Gifford Shave, MD
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
## 2021-06-14 HISTORY — PX: COLONOSCOPY: SHX174

## 2021-06-14 NOTE — Unmapped External Note (Signed)
Procedural Sedation  Consent: Written consent obtained and given by patient, or family member when appropriate.   Risks and benefits: risks, benefits and alternatives were discussed  Time out: Immediately prior to procedure a "time out" was called to verify the correct patient, procedure, equipment, support staff and site/side marked as required.  NPO status: 8 hours  Pre-procedure preparation: bag valve mask available, oxygen available, personal items removed, ride availability confirmed, suction available and patient identification present and verified  Notes reviewed: see notes for documentation of history, review of systems, and physical exam  Mouth opening: good  Neck range of motion: full  II (hard and soft palate, upper portion of tonsils anduvula visible)  ASA 2 - Patient with mild systemic disease with no functional limitations  Monitoring utilized: constant attendance by RN until patient recovered, continuous pulse oximetry, intubation and emergency airway equipment available and frequent vital sign checks  Sedation type: As below  Procedural start and stop times are recorded in nursing documentation.  Reversal agent: none  Respiratory status: normal  Duration of procedures (minutes): as below  Patient tolerance: Patient tolerated the procedure well with no immediate complications.    Procedure: Colonoscopy --diagnostic    Indications: hematochezia, change in bowel habit    Sedation: MAC    Procedure Details:     Informed consent was obtained for the procedure, including sedation.  Risks of perforation, hemorrhage, adverse drug reaction and aspiration were discussed. The patient was placed in the left lateral decubitus position.  Based on the pre-procedure assessment, including review of the patient's medical history, medications, allergies, and review of systems, the patient had been deemed to be an appropriate candidate for sedation; he was therefore sedated with the medications listed above.   The  patient was monitored continuously with pulse oximetry, blood pressure monitoring, and direct observations.      A rectal examination was performed.  The variable-stiffness adult colonoscope was inserted into the rectum and advanced under direct vision to the terminal ileum.  The quality of the colonic preparation was good.  A careful inspection was made as the colonoscope was withdrawn retroflex exam was performed. Findings and interventions are described below.    Withdrawal time: N/A minutes     Findings: The terminal ileum was normal with no erythema, edema, or erosive changes. There were surgical changes consistent with a segmental resection and end-to-side coloilia anastomosis. The ascending/transverse colon was normal with no masses, polyps, AVM's, or erosive changes. There were mild diffuse mucosal abnormalities characterized by patchy erythema and loss of haustra throughout the colon. There were few scattered diverticula through the sigmoid and descending colon. Retroflexion in the right colon and anorectum demonstrated moderate internal hemorrhoids. Multiple random biopsies were obtained from the colon and terminal ileum.     Specimens: as above           Complications: None     Estimated Blood Loss: None        Disposition:  Home    Condition: Stable    Attending Attestation: I personally performed this examination.    Assistants: None    Impression:  1. Surgical changes with right hemicolectomy  2. Diffuse mucosal abnormality - ?colitis  3. Mild diverticulosis  4. Moderate internal hemorrhodis    Recommendations: 1. Follow biopsies to optimize pharmacologic treatment of possible colitis  2. Consider cholestyramine         This documentation was completed by Montey Hora, acting as a medical scribe in the presence of  and under the direction of Dr. Chryl Heck MD, MPH, FASGE.      I, Dr. Chryl Heck MD, MPH, Michelene Gardener, personally performed the services described in this documentation. All medical record entries made by the  scribe were at my direction and in my presence. I have reviewed the chart and agree that the record reflects my personal performance and is accurate and complete.

## 2021-10-13 LAB — UNMAPPED LAB RESULTS
Basophil # (HT): 0.1 10 3/uL (ref 0.0–0.2)
Basophil % (HT): 1 % (ref 0–2)
Eosinophil # (HT): 0.2 10 3/uL (ref 0.0–0.5)
Eosinophil % (HT): 3 % (ref 0–7)
Hematocrit (HT): 41 % (ref 34–47)
Hemoglobin (HGB) (HT): 14 g/dL (ref 11.5–16.0)
Hep C Ab: NEGATIVE
Lymphocyte # (HT): 1.5 10 3/uL (ref 0.9–3.8)
Lymphocyte % (HT): 25 % (ref 17–44)
MCHC (HT): 33.8 g/dL (ref 32.0–36.0)
MCV (HT): 93.2 fL (ref 81.0–99.0)
Mean Corpuscular Hemoglobin (MCH) (HT): 31.5 pg (ref 26.0–34.0)
Monocyte # (HT): 0.6 10 3/uL (ref 0.2–1.0)
Monocyte % (HT): 10 % (ref 4–12)
Neutrophil # (HT): 3.6 10 3/uL (ref 1.5–7.7)
Platelets (HT): 211 10 3/uL (ref 140–400)
RBC (HT): 4.44 10 6/uL (ref 3.80–5.20)
RDW (HT): 12.8 % (ref 11.5–15.0)
Seg Neut % (HT): 61 % (ref 40–75)
WBC (HT): 5.9 10 3/uL (ref 4.0–10.8)

## 2021-10-13 NOTE — Patient Instructions (Addendum)
Please get me records from pulmonary and from gynecology/oncology    Please use a cane
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
## 2021-10-13 NOTE — Progress Notes (Addendum)
When was the last?  Last Colonoscopy: 06/14/21 Dr. Chryl Heck  Impression:  1. Surgical changes with right hemicolectomy  2. Diffuse mucosal abnormality - ?colitis  3. Mild diverticulosis  4. Moderate internal hemorrhodis  Recommendations: 1. Follow biopsies to optimize pharmacologic treatment of possible colitis  2. Consider cholestyramine   A. ?TERMINAL ILEUM, BIOPSY:   ?? ? - NO SIGNIFICANT PATHOLOGIC FINDINGS   B. ?COLON, BIOPSY:   ?? ? - NO SIGNIFICANT PATHOLOGIC FINDINGS   Last Dexa: 08/04/20 [facility]  *STARTED FOSAMAX 07/2020  Last Mammogram: 08/28/19 [facility], reminded to sch  Last PAP/PSA: 10/22/13  Last FLP: 06/2020  Last FBG: 05/2021  Last Eye Exam: q6 months    Last Dental Exam: regularly    HealthCare Proxy on file?: 08/2017 Proxy on file     No results found for: "HAAB", "HAIGM", "HBCIM", "HBCAB", "HBEAG", "HCAB", "HBSAG", "HBSAB"    ?  Who is the?  GYN:?Dr. Jacqlyn Larsen in [facility]  Dentist:?Dr. [facility] Doctor: Dr.?Zazolak    -R knee osteoarthritis s/p steroid injection Deidre Ala, PA at [facility] 07/2021    Heme/Onc - Dr. 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 She also has history of recurrent DVT and PE.  - She is tolerating Letrozole with minimal side effects.  - She does have hot flashes. She is on Gabapentin for neuropathy which should help with hot flashes as well.  - Imaging done in June 2021 was negative for any recurrent disease.  *REPEAT IMAGING DONE 06/2020  - As for recurrent Pulmonary Embolus, continue Eliquis 5 mg twice daily for at least 6 months. After 6 months of full dose Anticoagulation, can likely switch to prophylactic dosage of Eliquis 2.5 mg bid indefinitely.  - She will in [place] for 6 months. Advised to follow-up with her Gynecology-Oncology team there.  - Follow-up in 1 year.    Immunization History    Administered Date(s) Administered   ? DTaP 01/24/2010   ? Influenza, High Dose (FLUZONE) 12/11/2014, 10/20/2016, 10/21/2018, 09/30/2020   ? Influenza, Quad w/Pres [place]da [place]) 01/03/2013, 01/13/2014   ? Influenza, Seasonal Unspecified 01/29/2012   ? Influenza, Trivalent 26yrs and older w/Pres 11/22/2010   ? Pfizer Sars-CoV-2 Vaccination (Purple Cap) (Adult/Adol.) 03/26/2019, 04/16/2019, 10/08/2019   ? Pneumococcal Conjugate 13 (PCV-13) 03/05/2015   ? Pneumococcal Polysaccharide (PPV23) 11/29/2005   ? TDAP 09/01/2020   ? Zoster Vaccine, Live 08/23/2010   ? Zoster Vaccine, Recombinant (Shingrix) 09/01/2020, 11/09/2020

## 2021-11-04 ENCOUNTER — Other Ambulatory Visit: Payer: Self-pay | Admitting: Gastroenterology

## 2021-12-05 IMAGING — CT CT ABDOMEN AND PELVIS WITH IV AND ORAL CONT
2 of 3 series · 16 of 46 positions shown, 18 images · IV contrast (APPLIED)
Comparison: 05/26/2021.

________________________________________________________________________________________________ 
CT ABDOMEN AND PELVIS WITH IV AND ORAL CONT, 12/05/2021 [DATE]: 
CLINICAL INDICATION: History of ovarian cancer. 
A search for DICOM formatted images was conducted for prior CT imaging studies 
completed at a non-affiliated media free facility.
TECHNIQUE: The abdomen and pelvis were scanned from lung bases through the 
pubic rami with 75 mL of Isovue 300 MDV contrast on a high-resolution CT scanner 
using dose reduction techniques. Patient drank diluted oral contrast for this 
study. Routine MPR reconstructions were performed. The patients eGFR was 
calculated to be 40.2 mL/min/1.73 m2 using the i-STAT device.

[Series 4: abd/pel ax w · axial · 0.76mm/px · z∈[-458,-62]mm · 13 of 152 slices shown, 15 images]
[im 10/152  soft-tissue]
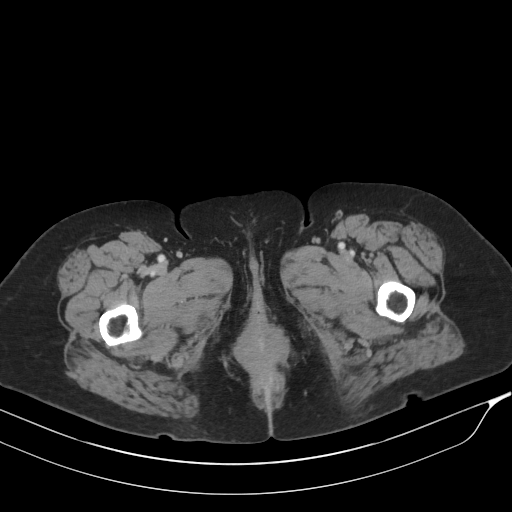
[im 10/152  bone]
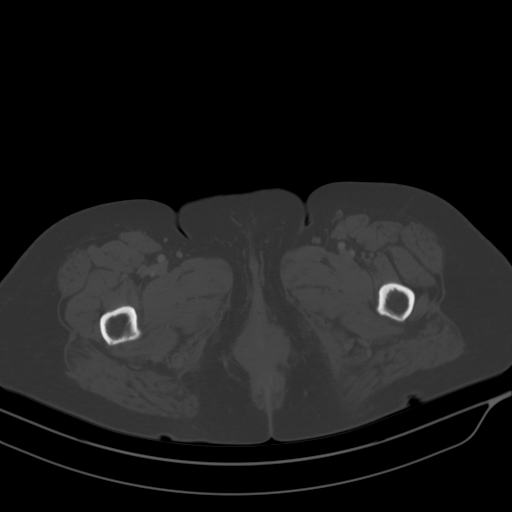
[im 20/152  soft-tissue]
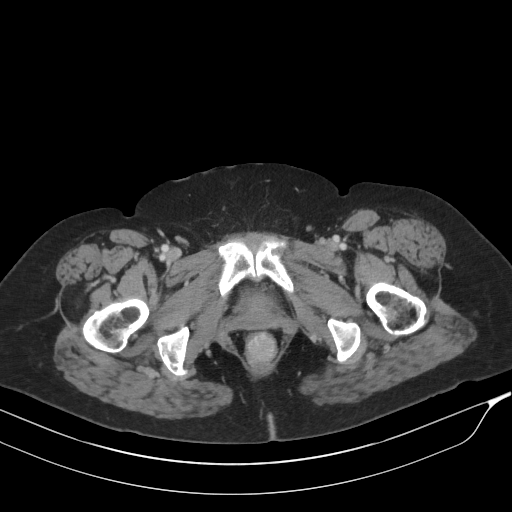
[im 30/152  soft-tissue]
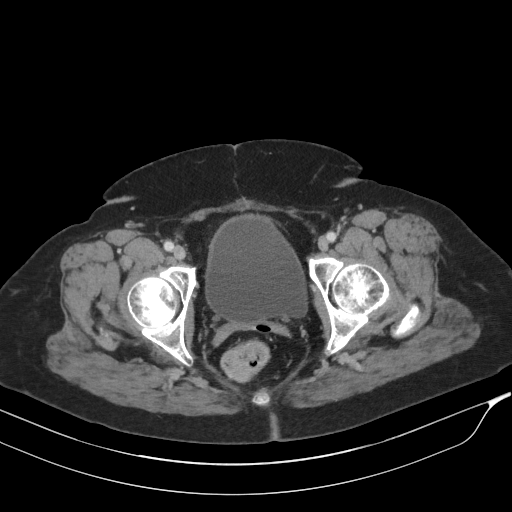
[im 44/152  soft-tissue]
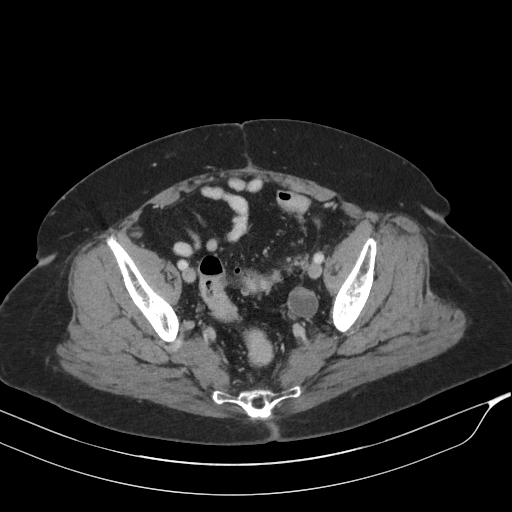
[im 54/152  soft-tissue]
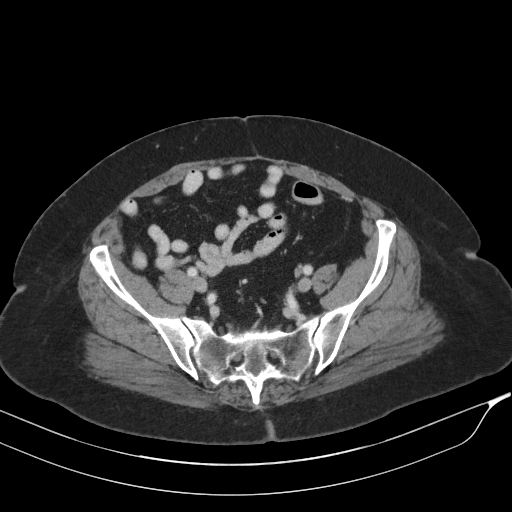
[im 64/152  soft-tissue]
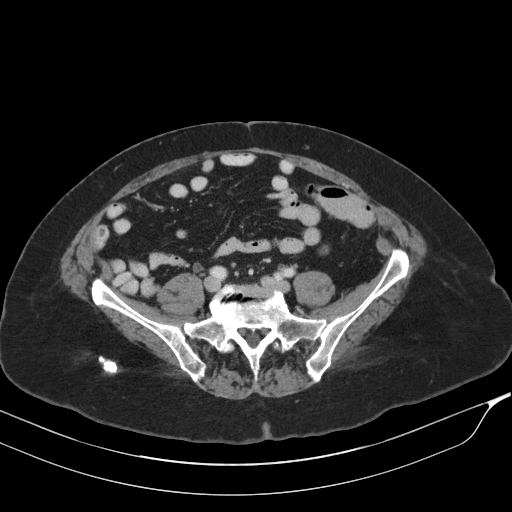
[im 78/152  soft-tissue]
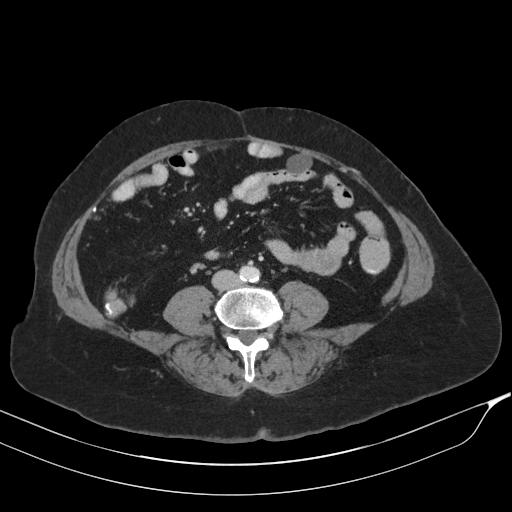
[im 88/152  soft-tissue]
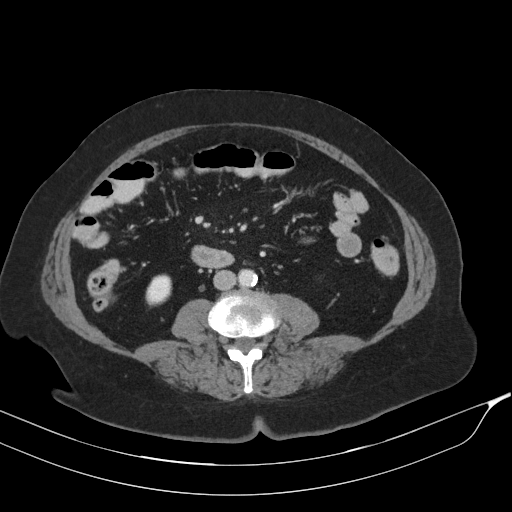
[im 98/152  soft-tissue]
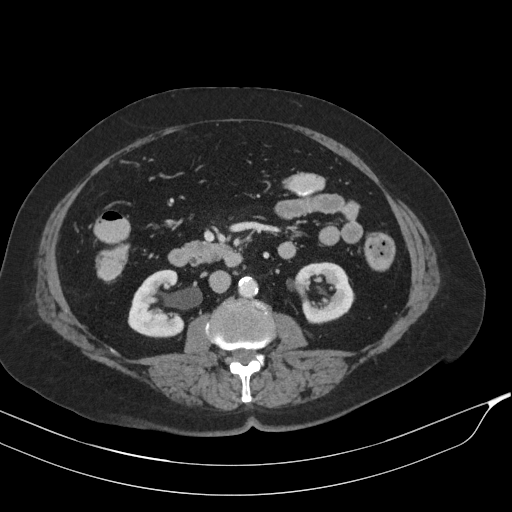
[im 98/152  bone]
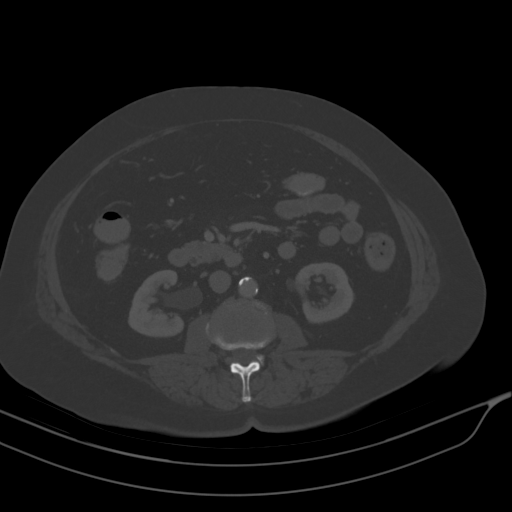
[im 108/152  soft-tissue]
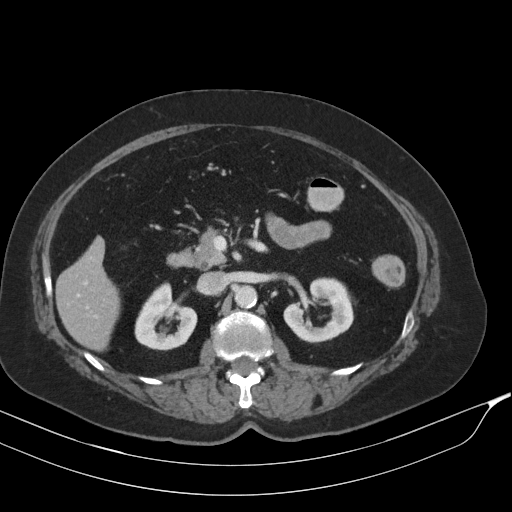
[im 122/152  soft-tissue]
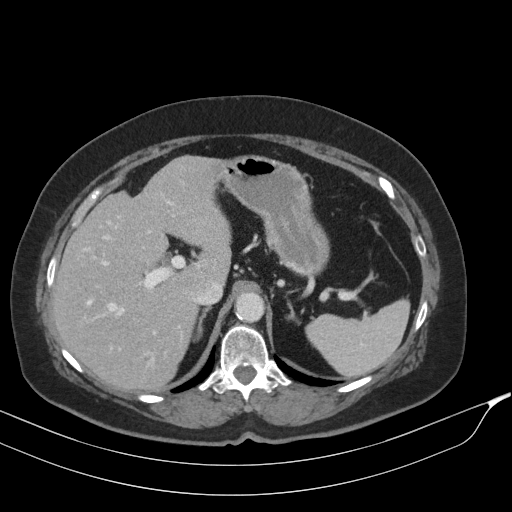
[im 132/152  soft-tissue]
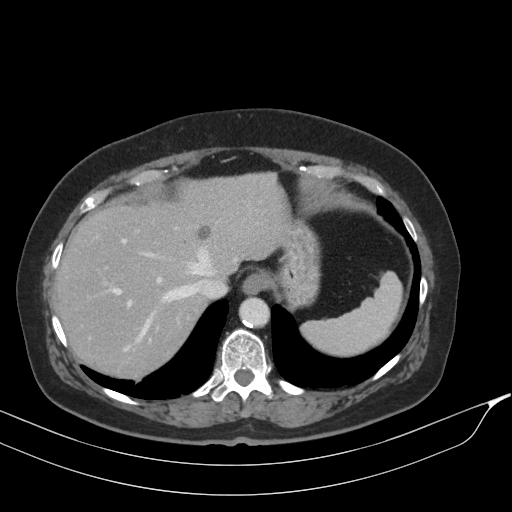
[im 142/152  soft-tissue]
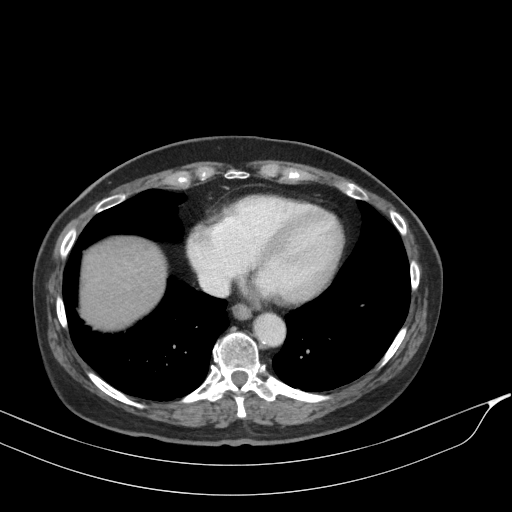

[Series 5: abd/pel cor w · coronal · 0.74mm/px · 3 of 131 slices shown]
[im 44/131  soft-tissue]
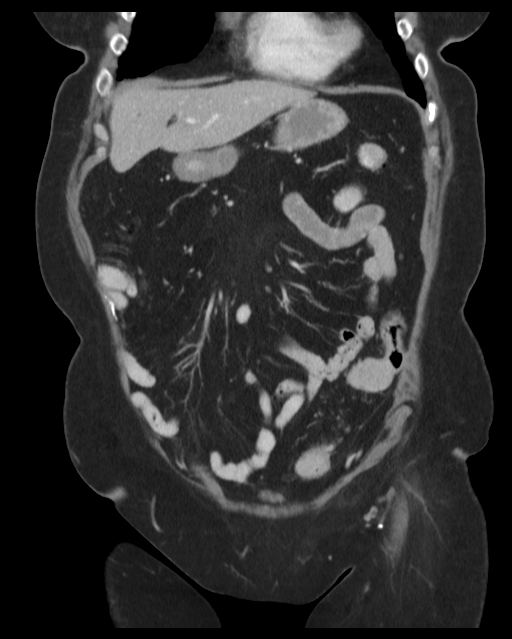
[im 58/131  soft-tissue]
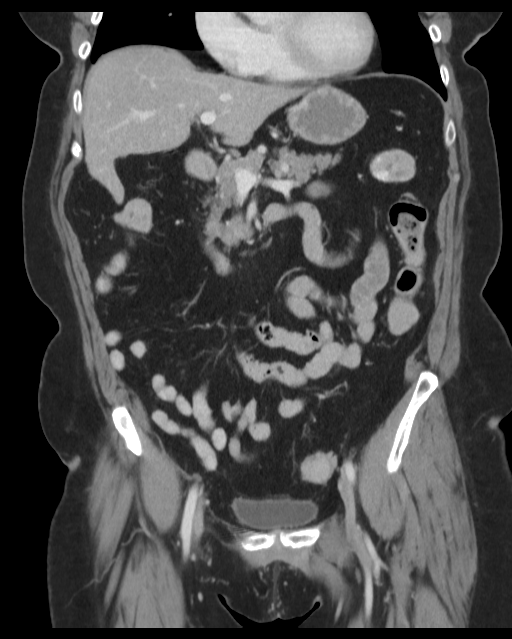
[im 73/131  soft-tissue]
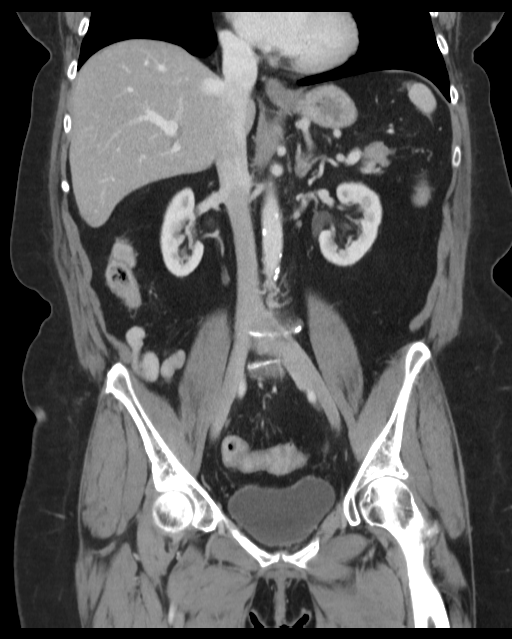

[16 of 46 positions shown; findings below may reference images not displayed]

FINDINGS: LUNG BASES: Lung bases are clear. No pleural effusions. 
HEPATOBILIARY: Fatty infiltration. No biliary dilatation.. 1.8 x 1.1 cm septated 
left hepatic cyst. Status post cholecystectomy. Portal and hepatic veins are 
patent. 
SPLEEN: Normal in size. 
PANCREAS: No evidence for ductal dilatation or mass.   
ADRENALS: No mass. 
GENITOURINARY: No hydronephrosis or kidney stones. Bladder is unremarkable. 
Stable 2.5 x 2.5 x 2.5 cm cyst left ovary. Status post hysterectomy no right 
adnexal mass.. 
LYMPH NODES: Stable 9 mm left pelvic mesenteric lymph node. No adenopathy. 
STOMACH, SMALL BOWEL AND COLON: Small hiatal hernia. Partial right 
hemicolectomy, appendectomy and proximal transverse colonic resection staples. 
No focal wall thickening or adenopathy in the adjacent mesentery. No mass or 
obstruction. Colonic diverticulosis without evidence of diverticulitis. 
VASCULAR STRUCTURES: No aneurysm. Atherosclerosis. 
MUSCULOSKELETAL: Mild chronic T12 superior endplate compression fracture, 
degenerative change and osteopenia.  
ADDITIONAL FINDINGS: None.
IMPRESSION: 1.  Stable 2.5 cm cyst left adnexa with associated mild prominence of left 
mesenteric nodule. 1.8 x 1.1 cm septated left hepatic cyst.  
2. No new abdominal or pelvic adenopathy. 
3. Fatty liver. Septated cyst left lobe. 
RADIATION DOSE REDUCTION: All CT scans are performed using radiation dose 
reduction techniques, when applicable.  Technical factors are evaluated and 
adjusted to ensure appropriate moderation of exposure.  Automated dose 
management technology is applied to adjust the radiation doses to minimize 
exposure while achieving diagnostic quality images.

## 2021-12-05 IMAGING — CT CT BRAIN W/WO CONTRAST
3 series · 14 of 47 positions shown, 16 images · IV contrast (APPLIED)
Comparison: None

________________________________________________________________________________________________ 
CT BRAIN W/WO CONTRAST, 12/05/2021 [DATE]: 
CLINICAL INDICATION: Headache. Trigeminal neuralgia. Blurred vision. Ovarian 
cancer history. 
A search for DICOM formatted images was conducted for prior CT imaging studies 
completed At a non-affiliated media free facility.
TECHNIQUE: The head was scanned from vertex through skull base without and with 
50 mL of Isovue 300 MDV injected intravenously on a high resolution CT scanner 
using dose reduction techniques. MPR reconstructions were performed.

[Series 2: brain w 3.0 j30s 1 · axial · 0.41mm/px · z∈[-190,-55]mm · 8 of 53 slices shown, 10 images]
[im 4/53  brain]
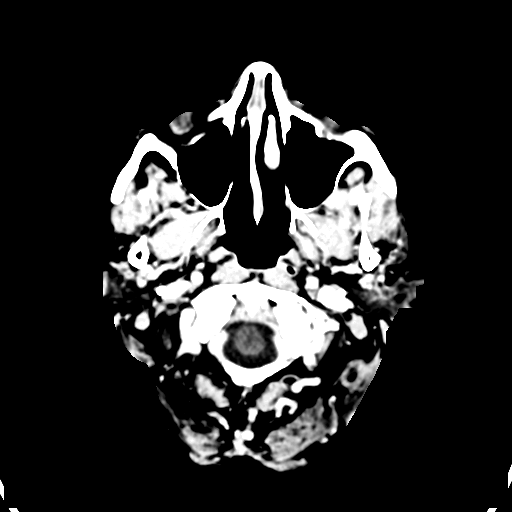
[im 4/53  bone]
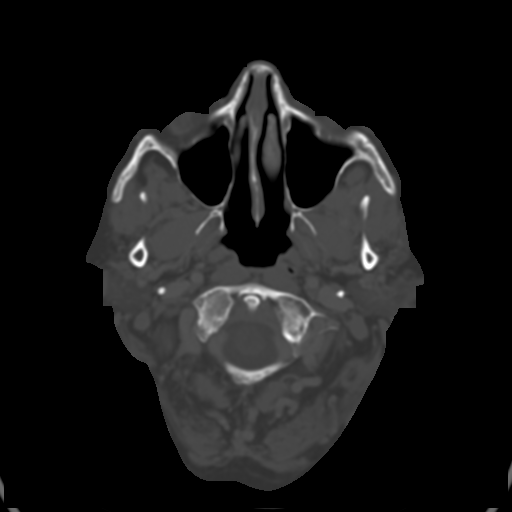
[im 11/53  brain]
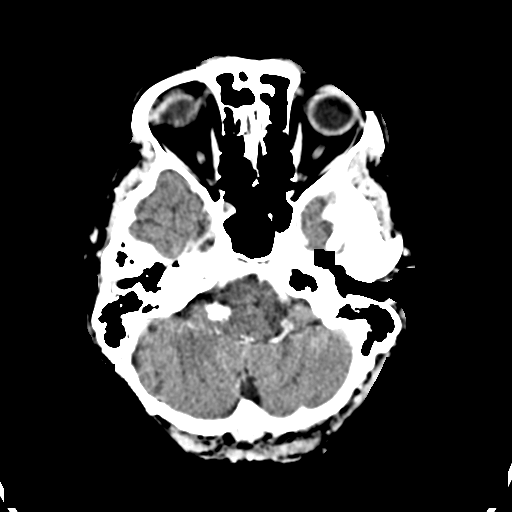
[im 17/53  brain]
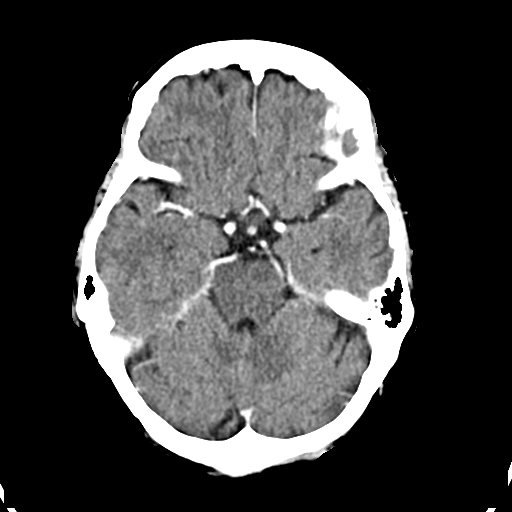
[im 24/53  brain]
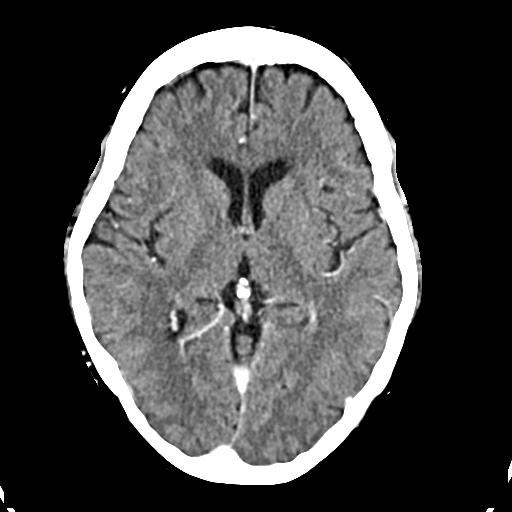
[im 29/53  brain]
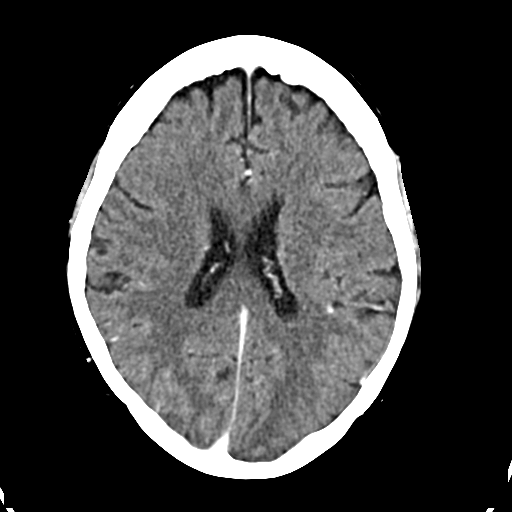
[im 29/53  bone]
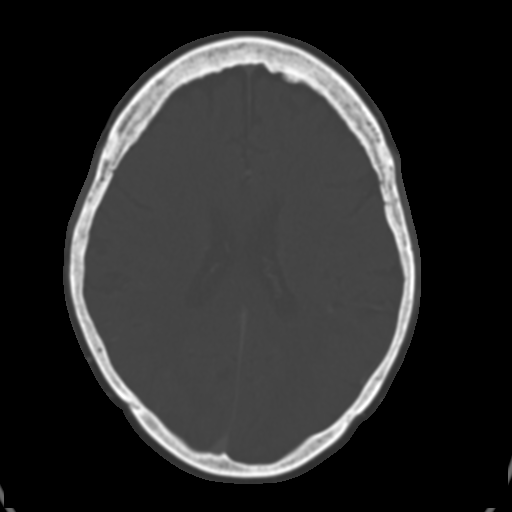
[im 36/53  brain]
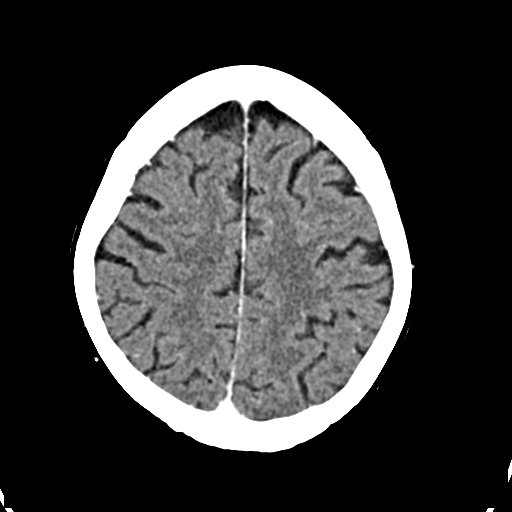
[im 42/53  brain]
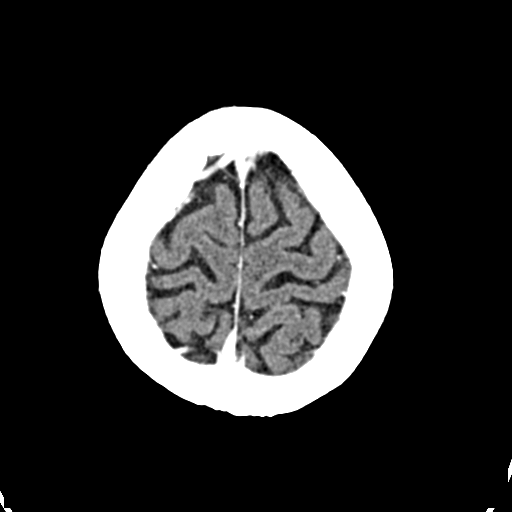
[im 49/53  brain]
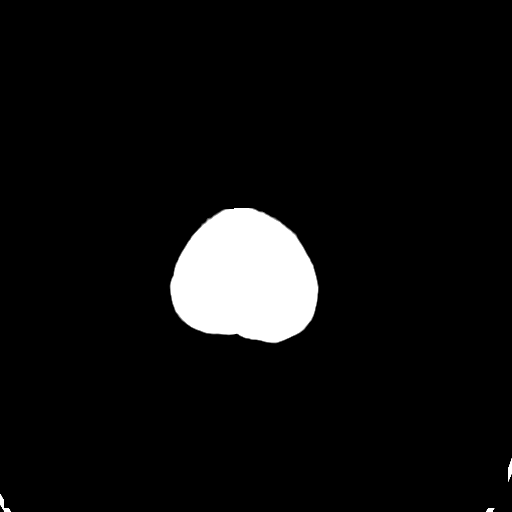

[Series 4: coronal · coronal · 0.31mm/px · 3 of 65 slices shown]
[im 22/65  brain]
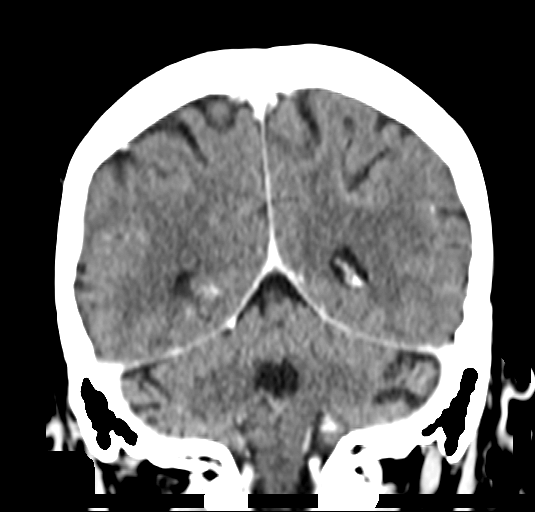
[im 29/65  brain]
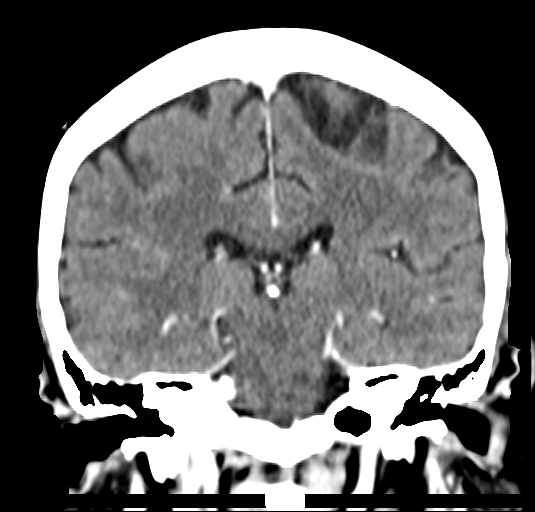
[im 36/65  brain]
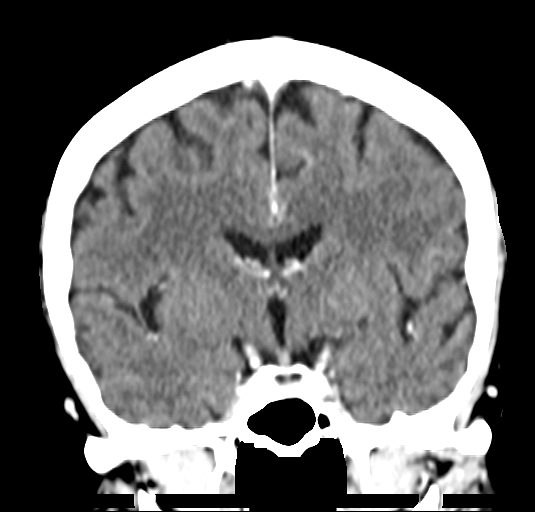

[Series 5: sagittal · sagittal · 0.32mm/px · 3 of 52 slices shown]
[im 18/52  brain]
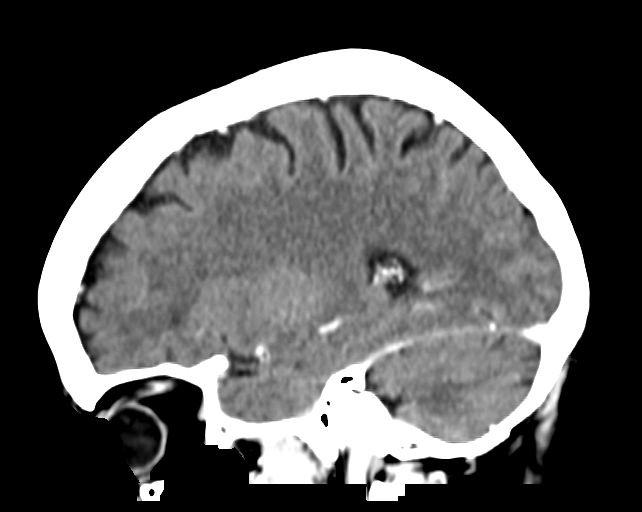
[im 26/52  brain]
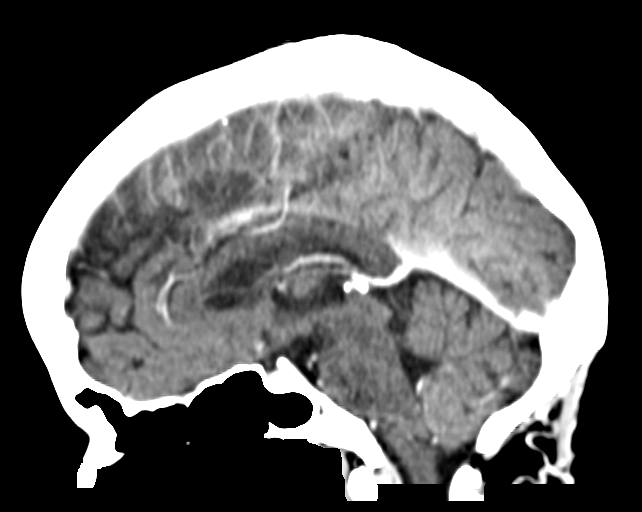
[im 35/52  brain]
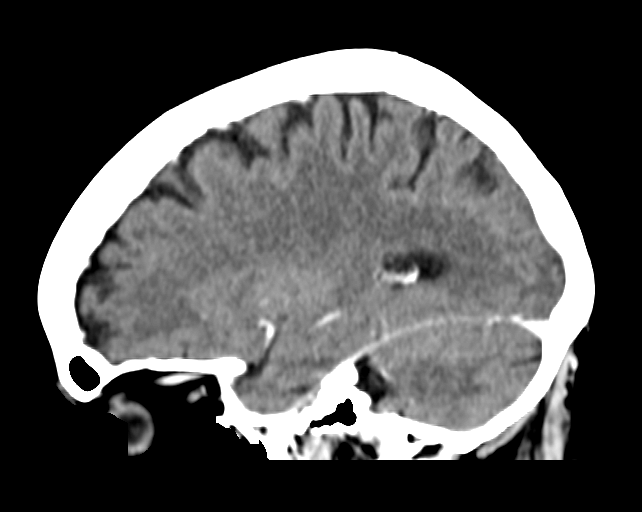

[14 of 47 positions shown; findings below may reference images not displayed]

FINDINGS: --------------------------------------------------------------------      
INTRACRANIAL: 
There is mild volume loss along the right lateral cerebellar hemisphere, which 
may reflect a resection site and is deep to right occipital craniotomy changes. 
There is also hyperdensity noted at the root entry zone of the intraspinal 
location of the right trigeminal nerve, which may be on a postsurgical basis. 
There is a dense area of mineralization along the right lateral aspect of the 
lower pons which is in the general vicinity of the distal right vertebral artery 
and right posterior inferior cerebellar artery. Significance uncertain. The 
possibility of extensive distal vertebral intimal calcification and/or aneurysm 
is entertained. 
Also noted is a prominent curvilinear vessel in the paramedian right frontal 
lobe image 33 series 2 and image 42 series 4 that may reflect a small 
developmental venous anomaly. No other pathologic enhancement. No mass or 
abnormal extra-axial fluid collection. 
Mild nonspecific periventricular low attenuation changes likely reflective of 
chronic small vessel vascular change. 
No acute intracranial hemorrhage, mass effect, or midline shift. No CT evidence 
of large acute territorial ischemia. No pathologic enhancement in the brain.  
Please note MRI is more sensitive for acute ischemia.  
-------------------------------------------------------------------- 
OTHER:  
ORBITS/SINUSES/T-BONES:  Bilateral pseudophakia. Sinuses clear. No mastoid or 
middle ear effusion. 
BONES/SOFT TISSUES: Previous right occipital craniotomy. No fracture, lytic, or 
sclerotic bone lesion. 
--------------------------------------------------------------------
IMPRESSION: No acute intracranial process. 
Focal region of hyperdensity is noted in the anticipated location of the right 
trigeminal nerve root entry zone. Uncertain if this is on a posttreatment basis 
or related to other etiologies such as an incidental 8 mm mineralized 
extra-axial mass in this location such as a meningioma. There is also a dense 
focus of mineralization along the right lateral inferior pons, in the 
anticipated location of the distal right vertebral artery and/or the right 
posterior inferior cerebellar artery. The significance of this finding is 
uncertain. It could potentially reflect extensive intimal calcification and/or 
aneurysm. 
Given the above findings, follow-up MRI brain without and with gadolinium and 
MRA circle of Willis recommended to better assess. 
Previous right occipital craniotomy with mild volume loss right lateral 
cerebellar hemisphere. 
RADIATION DOSE REDUCTION: All CT scans are performed using radiation dose 
reduction techniques, when applicable.  Technical factors are evaluated and 
adjusted to ensure appropriate moderation of exposure.  Automated dose 
management technology is applied to adjust the radiation doses to minimize 
exposure while achieving diagnostic quality images.

## 2021-12-15 ENCOUNTER — Other Ambulatory Visit: Payer: Self-pay | Admitting: Gastroenterology

## 2021-12-15 IMAGING — MR MRA BRAIN WITHOUT CONTRAST
4 series · 8 of 48 positions shown · non-contrast
Comparison: Head CT December 05, 2021.

________________________________________________________________________________________________ 
MRA BRAIN WITHOUT CONTRAST, 12/15/2021 [DATE]: 
CLINICAL INDICATION: Abnormal head CT November 2021. Balance problems. History 
of trigeminal neuralgia.
TECHNIQUE: MR arteriography with MIPs of the brain is performed with computer 
reformatting of the source data to create arteriographic images. Patient was 
scanned on a 1.5T magnet.

[Series 102: mpr - smartbrain · axial · 1.1mm · 1.09mm/px · 1 of 2 slices shown]
[im 1/2]
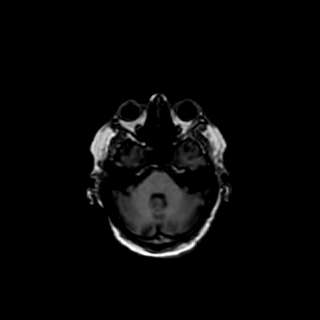

[Series 301: 3d_tof_cow · axial · 1.0mm · 0.38mm/px · z∈[-10,+61]mm · 3 of 200 slices shown]
[im 29/200]
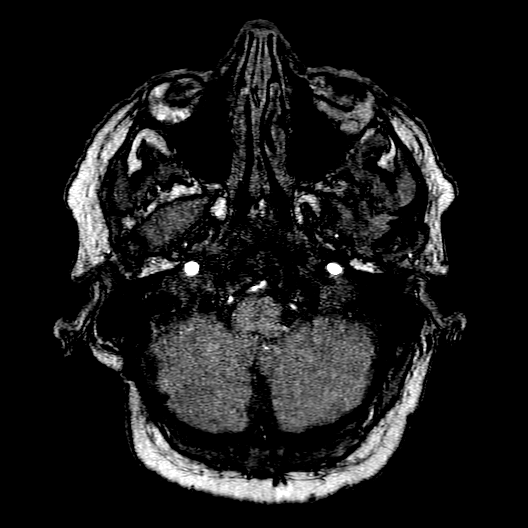
[im 100/200]
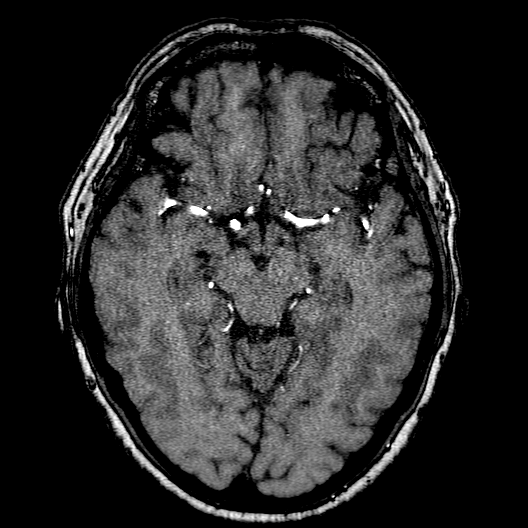
[im 171/200]
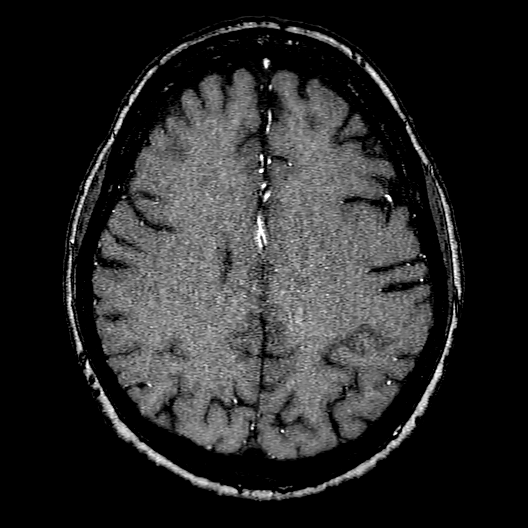

[Series 303: cor mpr · coronal · 2.0mm · 0.20mm/px · 3 of 65 slices shown]
[im 9/65]
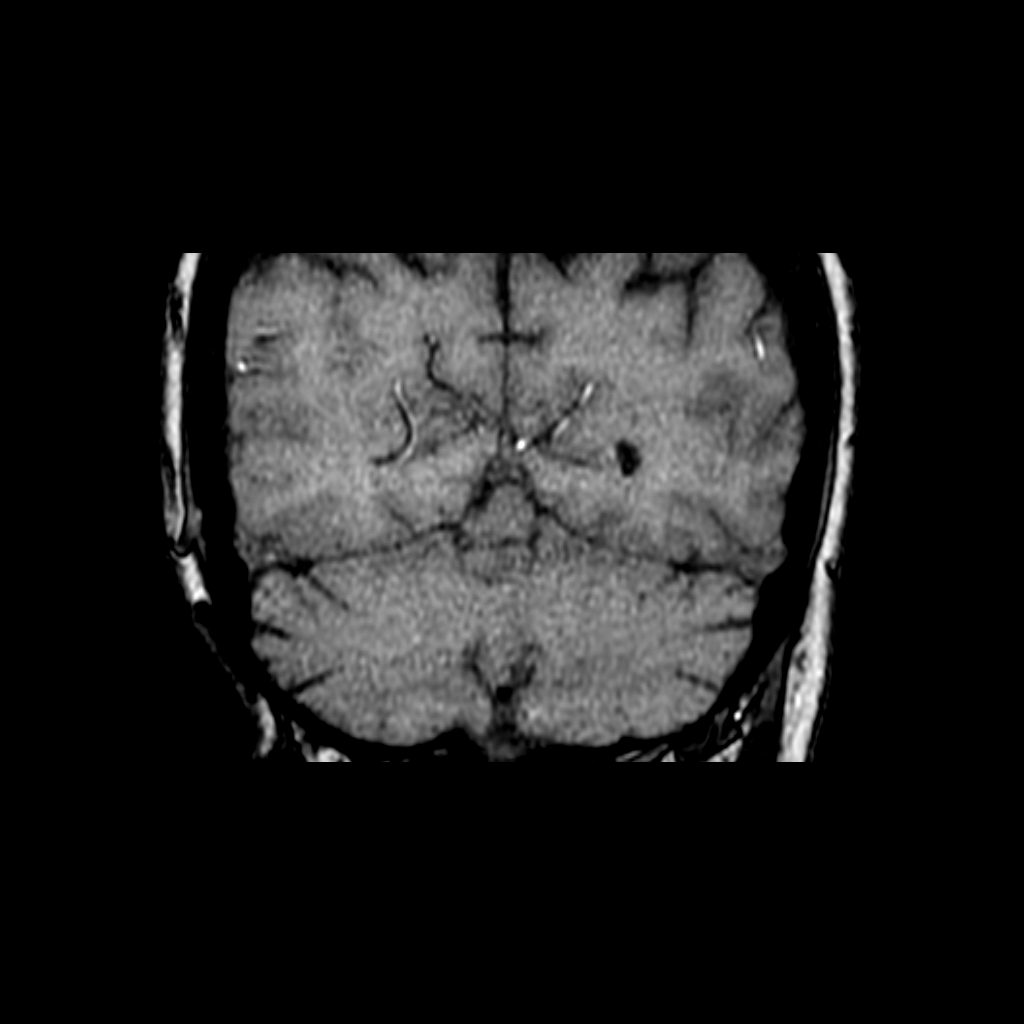
[im 33/65]
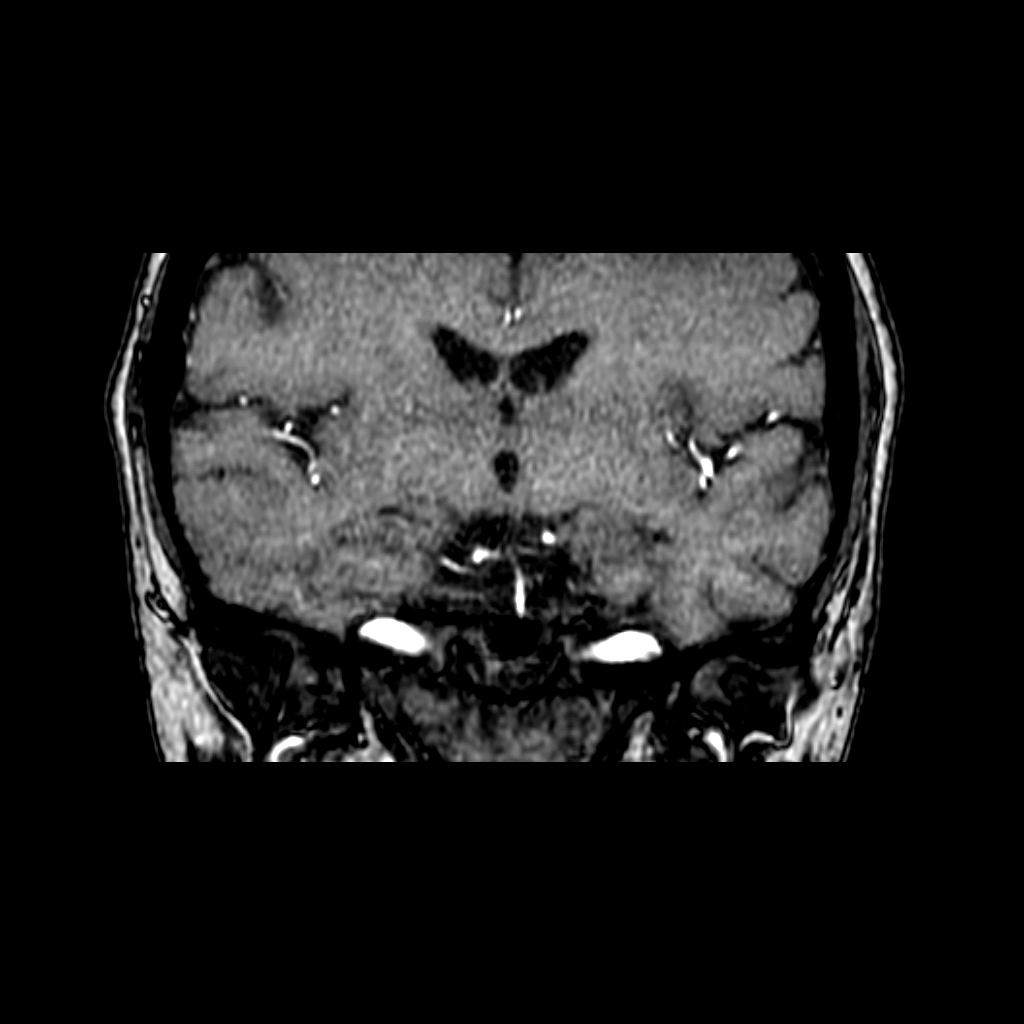
[im 57/65]
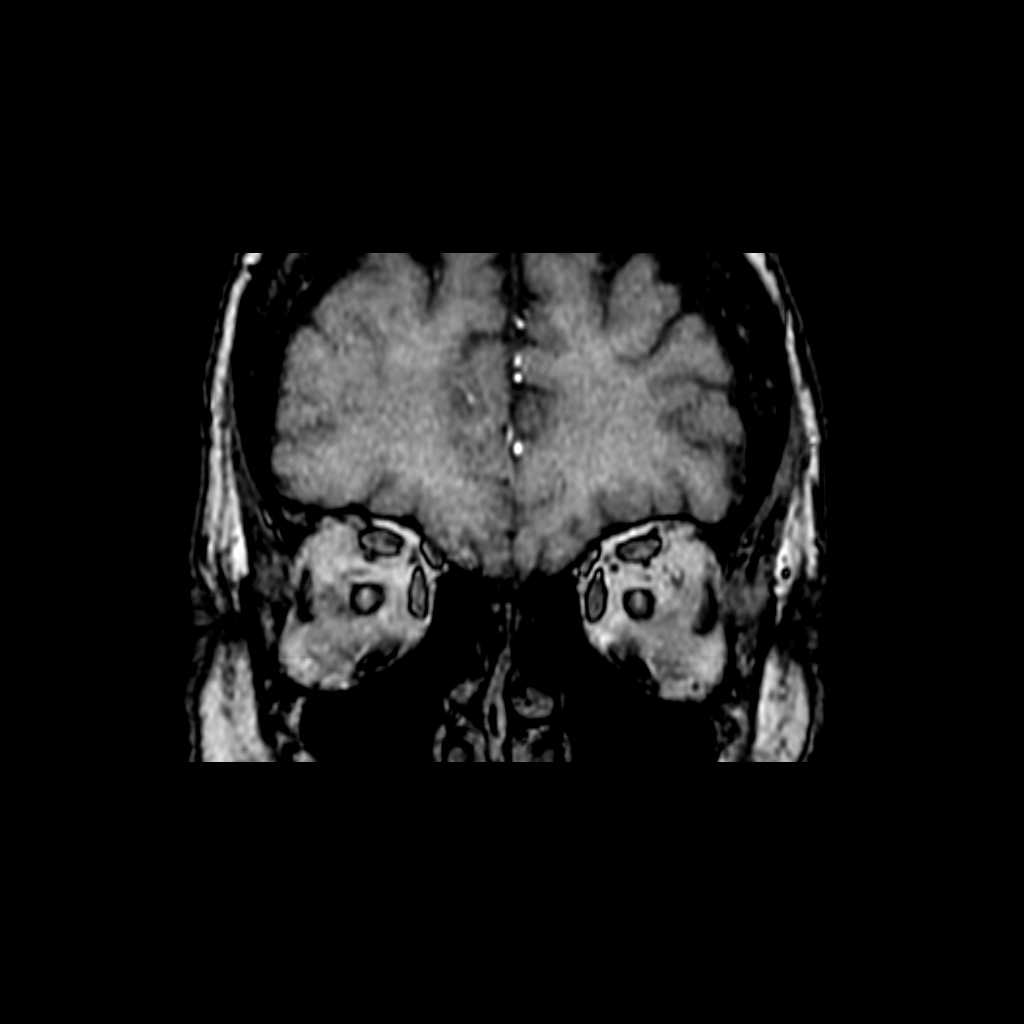

[Series 304: sag mpr · sagittal · 2.0mm · 0.20mm/px · 1 of 65 slices shown]
[im 9/65]
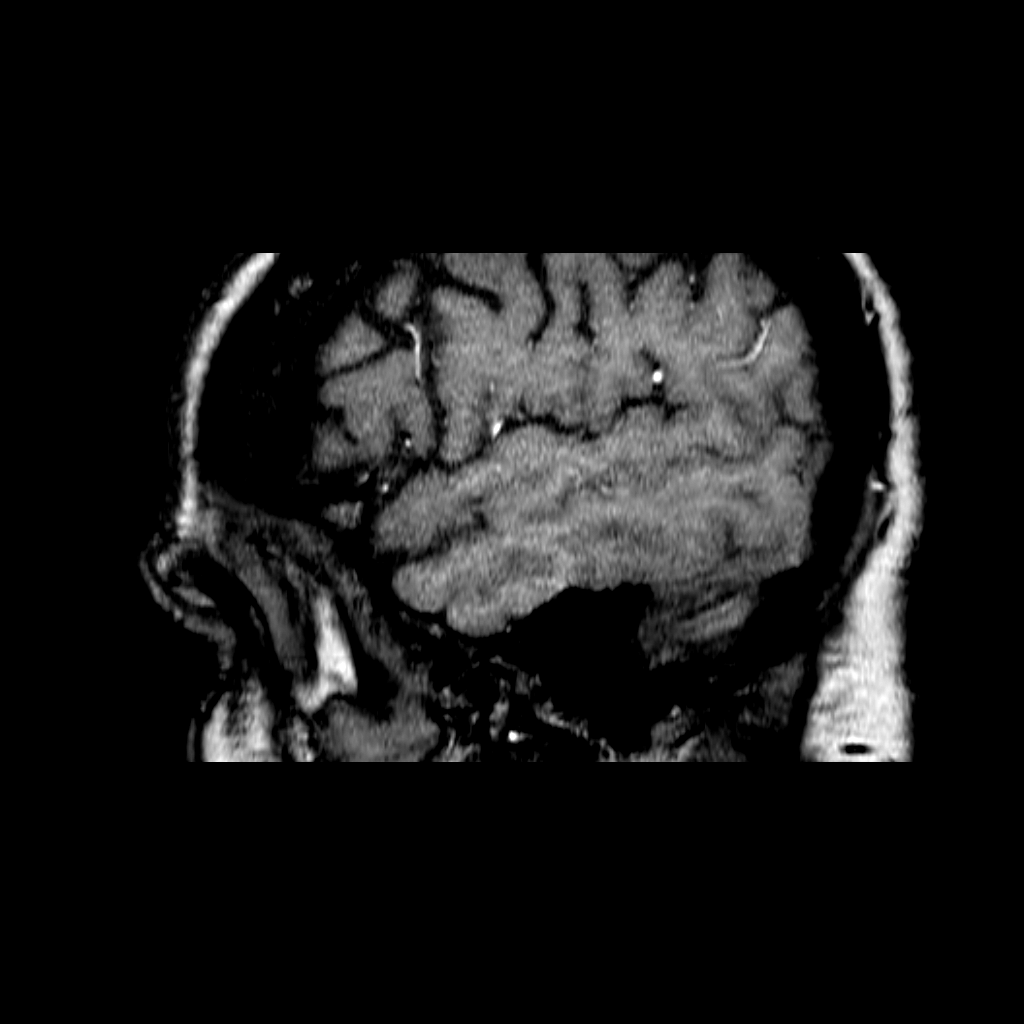

[8 of 48 positions shown; findings below may reference images not displayed]

FINDINGS: RIGHT ANTERIOR CIRCULATION: Distal internal carotid artery and anterior and 
middle cerebral arteries without significant stenosis. 

LEFT ANTERIOR CIRCULATION: Distal internal carotid artery and anterior and 
middle cerebral arteries without significant stenosis. 

POSTERIOR CIRCULATION: The posterior circulation is diminutive in caliber. 
Diminutive caliber bilateral distal vertebral arteries and basilar artery. 
Partial fetal origin of the posterior cerebral arteries bilaterally. 

ANEURYSM/AVM: No aneurysm. Please note, MRA is less sensitive for aneurysms less 
than 4 mm in size.  There is no vascular nidus to suggest high-grade 
arteriovenous malformation.
IMPRESSION: No convincing evidence of intracranial aneurysm. 
Posterior circulation is diminutive in caliber with partial fetal origin of the 
posterior cerebral arteries bilaterally. 
As discussed on head CT report December 05, 2021, follow-up brain MRI without and 
with gadolinium is recommended to further assess the head CT findings.

## 2021-12-15 NOTE — Telephone Encounter (Signed)
Last Visit: 10/13/2021   Upcoming Visit: 01/03/2022       Front:    Lab Results   Component Value Date    HBA1C 5.6 06/06/2021    LDLC 80 10/13/2021    TSH 0.97 10/13/2021    GLU 102 10/13/2021    CA 9.3 10/13/2021    NA 142 10/13/2021    K 4.4 10/13/2021    CL 108 10/13/2021    BUN 20 10/13/2021    CREAT 1.1 10/13/2021    AST 24 10/13/2021       BP Readings from Last 1 Encounters:   10/13/21 102/60

## 2021-12-26 NOTE — Telephone Encounter (Signed)
Called [facility]([phone]); spoke to [place].  They will fax the report to [place].

## 2021-12-26 NOTE — Telephone Encounter (Signed)
Call Information    What is the primary reason you are calling today?: Other                                                                                           OTHER REASON FOR CALL: Pt states something was found on her brain on MRA on tests done in [place]. Pt asking for an MRI. Pt states her doctor in [place] was going to send all the files to Dr. Nedra Hai. Does office have these?

## 2021-12-26 NOTE — Telephone Encounter (Signed)
Not that I am aware of.  Please get records

## 2021-12-28 NOTE — Telephone Encounter (Signed)
Pt scheduled on 11/30 at 10:45am

## 2021-12-28 NOTE — Telephone Encounter (Signed)
Julie Walsh states she is wondering if the x-rays have been reviewed. Julie Walsh is awaiting a call back.

## 2021-12-28 NOTE — Telephone Encounter (Signed)
See if she can come in tomorrow at 10:45 am

## 2021-12-29 LAB — UNMAPPED LAB RESULTS
Basophil # (HT): 0.1 10 3/uL (ref 0.0–0.2)
Basophil % (HT): 1 % (ref 0–2)
Eosinophil # (HT): 0.1 10 3/uL (ref 0.0–0.5)
Eosinophil % (HT): 2 % (ref 0–7)
Hematocrit (HT): 42 % (ref 34–47)
Hemoglobin (HGB) (HT): 14.1 g/dL (ref 11.5–16.0)
Lymphocyte # (HT): 1.8 10 3/uL (ref 0.9–3.8)
Lymphocyte % (HT): 31 % (ref 17–44)
MCHC (HT): 33.7 g/dL (ref 32.0–36.0)
MCV (HT): 93.3 fL (ref 81.0–99.0)
Mean Corpuscular Hemoglobin (MCH) (HT): 31.4 pg (ref 26.0–34.0)
Monocyte # (HT): 0.5 10 3/uL (ref 0.2–1.0)
Monocyte % (HT): 9 % (ref 4–12)
Neutrophil # (HT): 3.2 10 3/uL (ref 1.5–7.7)
Platelets (HT): 220 10 3/uL (ref 150–450)
RBC (HT): 4.49 10 6/uL (ref 3.80–5.20)
RDW (HT): 12.8 % (ref 11.5–15.0)
Seg Neut % (HT): 56 % (ref 40–75)
WBC (HT): 5.7 10 3/uL (ref 4.0–10.8)

## 2022-01-04 DIAGNOSIS — R930 Abnormal findings on diagnostic imaging of skull and head, not elsewhere classified: Secondary | ICD-10-CM | POA: Insufficient documentation

## 2022-01-04 HISTORY — DX: Abnormal findings on diagnostic imaging of skull and head, not elsewhere classified: R93.0

## 2022-01-10 ENCOUNTER — Encounter: Payer: Self-pay | Admitting: Obstetrics & Gynecology

## 2022-01-12 ENCOUNTER — Inpatient Hospital Stay: Admit: 2022-01-12 | Discharge: 2022-01-12 | Disposition: A | Discharge status: Home / Self Care | Payer: Self-pay

## 2022-01-13 ENCOUNTER — Other Ambulatory Visit: Payer: Self-pay

## 2022-01-17 DIAGNOSIS — Z8543 Personal history of malignant neoplasm of ovary: Secondary | ICD-10-CM
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
## 2022-01-18 NOTE — Progress Notes (Signed)
Gynecologic Oncology New Patient Pathway   REGISTRATION:    Date of referral: 01/13/2022   Referring Provider: Other   Other referring provider: Self   Reason for Referral: Establish Care and Second Opinion   Diagnosis: History of Ovarian Cancer   Appointment date: 01/20/2022   Location: [facility]   Discipline: Medical Oncology and Surgical Oncology   Nurse Navigator encounter type: Chart Review and Telephone  TREATMENT:    Type of treatment: Other  BARRIERS TO CARE IDENTIFIED::   Need for Social Work involvement: No   Transportation needs: No   Housing stability: No   Poor/No Internet Access: No   Intimate partner violence: No   Social connections: No   Food insecurity: No   Physicist, medical strain: No   Tobacco Use: Former   Type: Cigarettes   Marijuana Use: None   Drug Use: None   Alcohol Use: Current  Additional Assessment:    Employment: Retired  Time spent on this encounter (in minutes): 20     01/20/22  Dr. Katrinka Blazing met with patient in clinic today. Plan of care is repeat/updated labs and a PET/CT scan. Writer will continue to follow as needed.

## 2022-01-20 ENCOUNTER — Other Ambulatory Visit
Admission: RE | Admit: 2022-01-20 | Discharge: 2022-01-20 | Disposition: A | Discharge status: Home / Self Care | Payer: Medicare Other | Source: Ambulatory Visit

## 2022-01-20 ENCOUNTER — Other Ambulatory Visit: Payer: Self-pay

## 2022-01-20 ENCOUNTER — Ambulatory Visit: Payer: Medicare Other | Attending: Obstetrics & Gynecology | Admitting: Obstetrics & Gynecology

## 2022-01-20 VITALS — BP 132/74 | HR 85 | Temp 96.6°F | Resp 20 | Ht 60.0 in | Wt 160.2 lb

## 2022-01-20 DIAGNOSIS — Z8543 Personal history of malignant neoplasm of ovary: Secondary | ICD-10-CM | POA: Insufficient documentation

## 2022-01-20 LAB — CBC AND DIFFERENTIAL
Baso # K/uL: 0 10*3/uL (ref 0.0–0.2)
Basophil %: 0.8 %
Eos # K/uL: 0.2 10*3/uL (ref 0.0–0.5)
Eosinophil %: 3 %
Hematocrit: 41 % (ref 34–49)
Hemoglobin: 13.5 g/dL (ref 11.2–16.0)
IMM Granulocytes #: 0 10*3/uL (ref 0.0–0.0)
IMM Granulocytes: 0.4 %
Lymph # K/uL: 1.8 10*3/uL (ref 1.0–5.0)
Lymphocyte %: 33.5 %
MCH: 32 pg (ref 26–32)
MCHC: 33 g/dL (ref 32–36)
MCV: 96 fL (ref 75–100)
Mono # K/uL: 0.5 10*3/uL (ref 0.1–1.0)
Monocyte %: 10 %
Neut # K/uL: 2.8 10*3/uL (ref 1.5–6.5)
Nucl RBC # K/uL: 0 10*3/uL (ref 0.0–0.0)
Nucl RBC %: 0 /100 WBC (ref 0.0–0.2)
Platelets: 224 10*3/uL (ref 150–450)
RBC: 4.2 MIL/uL (ref 4.0–5.5)
RDW: 12.8 % (ref 0.0–15.0)
Seg Neut %: 52.3 %
WBC: 5.3 10*3/uL (ref 3.5–11.0)

## 2022-01-20 LAB — COMPREHENSIVE METABOLIC PANEL
ALT: 29 U/L (ref 0–35)
AST: 26 U/L (ref 0–35)
Albumin: 4.4 g/dL (ref 3.5–5.2)
Alk Phos: 48 U/L (ref 35–105)
Anion Gap: 10 (ref 7–16)
Bilirubin,Total: 0.3 mg/dL (ref 0.0–1.2)
CO2: 27 mmol/L (ref 20–28)
Calcium: 9.5 mg/dL (ref 8.6–10.2)
Chloride: 104 mmol/L (ref 96–108)
Creatinine: 1.11 mg/dL — ABNORMAL HIGH (ref 0.51–0.95)
Glucose: 111 mg/dL — ABNORMAL HIGH (ref 60–99)
Lab: 16 mg/dL (ref 6–20)
Potassium: 4.2 mmol/L (ref 3.3–5.1)
Sodium: 141 mmol/L (ref 133–145)
Total Protein: 6.8 g/dL (ref 6.3–7.7)
eGFR BY CREAT: 52 * — AB

## 2022-01-20 LAB — CA 125: CA 125(eff 4-2010): 16 U/mL (ref 0–34)

## 2022-01-26 ENCOUNTER — Ambulatory Visit
Admission: RE | Admit: 2022-01-26 | Discharge: 2022-01-26 | Disposition: A | Discharge status: Home / Self Care | Payer: Medicare Other | Source: Ambulatory Visit | Attending: Radiology | Admitting: Radiology

## 2022-01-26 ENCOUNTER — Other Ambulatory Visit: Payer: Self-pay

## 2022-01-26 DIAGNOSIS — K2289 Other specified disease of esophagus: Secondary | ICD-10-CM | POA: Insufficient documentation

## 2022-01-26 DIAGNOSIS — N838 Other noninflammatory disorders of ovary, fallopian tube and broad ligament: Secondary | ICD-10-CM | POA: Insufficient documentation

## 2022-01-26 DIAGNOSIS — J358 Other chronic diseases of tonsils and adenoids: Secondary | ICD-10-CM | POA: Insufficient documentation

## 2022-01-26 DIAGNOSIS — Z90722 Acquired absence of ovaries, bilateral: Secondary | ICD-10-CM | POA: Insufficient documentation

## 2022-01-26 DIAGNOSIS — Z90710 Acquired absence of both cervix and uterus: Secondary | ICD-10-CM | POA: Insufficient documentation

## 2022-01-26 DIAGNOSIS — R933 Abnormal findings on diagnostic imaging of other parts of digestive tract: Secondary | ICD-10-CM | POA: Insufficient documentation

## 2022-01-26 DIAGNOSIS — Z9049 Acquired absence of other specified parts of digestive tract: Secondary | ICD-10-CM | POA: Insufficient documentation

## 2022-01-26 DIAGNOSIS — Z8543 Personal history of malignant neoplasm of ovary: Secondary | ICD-10-CM | POA: Insufficient documentation

## 2022-01-26 LAB — INHIBIN B: Inhibin B: 63 pg/mL — ABNORMAL HIGH (ref <=11)

## 2022-01-26 LAB — POCT GLUCOSE: Glucose POCT: 88 mg/dL (ref 60–99)

## 2022-01-26 MED ORDER — BARIUM SULFATE (REDI-CAT) 2 % PO SUSP *I*
450.0000 mL | Freq: Once | ORAL | Status: AC
Start: 2022-01-26 — End: 2022-01-26
  Administered 2022-01-26: 450 mL via ORAL

## 2022-01-26 MED ORDER — FLUORODEOXYGLUCOSE F-18 FDG IV *I*
7.8000 | Freq: Once | INTRAVENOUS | Status: AC | PRN
Start: 2022-01-26 — End: 2022-01-26
  Administered 2022-01-26: 7.8 via INTRAVENOUS

## 2022-01-31 ENCOUNTER — Telehealth: Payer: Self-pay | Admitting: Obstetrics & Gynecology

## 2022-01-31 ENCOUNTER — Telehealth: Payer: Self-pay

## 2022-01-31 NOTE — Telephone Encounter (Signed)
Left a voicemail asking for a call back to reschedule 1/18 appt to 1/19 per provider request

## 2022-01-31 NOTE — Telephone Encounter (Signed)
Spoke to pt about scheduling appt, appt was scheduled for 1/19 @ 1:45

## 2022-02-09 ENCOUNTER — Other Ambulatory Visit: Payer: Medicare Other

## 2022-02-16 ENCOUNTER — Ambulatory Visit: Payer: Medicare Other | Admitting: Obstetrics & Gynecology
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
## 2022-02-17 ENCOUNTER — Ambulatory Visit: Payer: Medicare Other | Attending: Obstetrics & Gynecology | Admitting: Obstetrics & Gynecology

## 2022-02-17 ENCOUNTER — Other Ambulatory Visit: Payer: Self-pay

## 2022-02-17 VITALS — BP 132/70 | HR 85 | Temp 97.2°F | Resp 20 | Ht 60.0 in | Wt 158.8 lb

## 2022-02-17 DIAGNOSIS — Z8543 Personal history of malignant neoplasm of ovary: Secondary | ICD-10-CM | POA: Insufficient documentation

## 2022-02-17 NOTE — Addendum Note (Signed)
Addended by: Johnna Acosta on: 02/17/2022 02:02 PM     Modules accepted: Orders
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
## 2022-02-28 ENCOUNTER — Ambulatory Visit: Payer: Medicare Other | Attending: Radiation Oncology | Admitting: Radiation Oncology

## 2022-02-28 ENCOUNTER — Other Ambulatory Visit: Payer: Self-pay

## 2022-02-28 VITALS — BP 144/81 | HR 83 | Temp 97.5°F | Wt 158.2 lb

## 2022-02-28 DIAGNOSIS — C569 Malignant neoplasm of unspecified ovary: Secondary | ICD-10-CM | POA: Insufficient documentation

## 2022-03-01 ENCOUNTER — Encounter: Payer: Self-pay | Admitting: Radiation Oncology

## 2022-03-07 ENCOUNTER — Ambulatory Visit: Payer: Medicare Other | Attending: Radiation Oncology | Admitting: Radiation Oncology

## 2022-03-07 DIAGNOSIS — C562 Malignant neoplasm of left ovary: Secondary | ICD-10-CM

## 2022-03-07 DIAGNOSIS — C569 Malignant neoplasm of unspecified ovary: Secondary | ICD-10-CM | POA: Insufficient documentation

## 2022-03-07 NOTE — Progress Notes (Signed)
Julie Walsh underwent a CT-based radiation simulation today for her granulosa cell tumor with recurrence in the left pelvis.  She tolerated this well.  She will begin a 1 week course of radiation in the near future.    [name] MD

## 2022-03-21 ENCOUNTER — Other Ambulatory Visit: Payer: Self-pay | Admitting: Radiation Oncology

## 2022-03-21 DIAGNOSIS — C569 Malignant neoplasm of unspecified ovary: Secondary | ICD-10-CM

## 2022-03-22 ENCOUNTER — Ambulatory Visit: Payer: Medicare Other | Admitting: Radiation Oncology

## 2022-03-22 LAB — RAD ONC ARIA SESSION SUMMARY
Course Elapsed Days: 0
Course Fractions Treated: 1
Plan Fractions Treated to Date: 1
Plan Prescribed Dose Per Fraction: 800 cGY
Plan Total Prescribed Dose: 4000 cGy
Prescribed Total Fractions for Plan - Total: 5
Reference Point Dosage Given to Date: 800 cGy
Reference Point Session Dosage Given: 800 cGy
Session Number: 1

## 2022-03-22 NOTE — Patient Instructions (Signed)
RADIATION ONCOLOGY                    INFORMATION FOR PATIENTS COMPLETING RADIATION THERAPY    Julie Walsh            The following suggestions will help you manage any side effects and care for yourself after radiation therapy is completed. Side effects may persist or become worse over the next week and gradually resolve over the next few weeks.    You will be receiving a post treatment questionnaire through MyChart. Please take a moment to complete and respond to the questions.    1.Skin Care: If your skin is very dry or red, minimize the use of soap. You can return to you regular routine once the redness and dryness are gone. Let the radiation marks gradually wear off. Do not scrub. Once your skin reaction has completely healed, it is important to apply sunscreen (#30 or higher) to any treated skin that is exposed to the sun. This will prevent a sunburn.    2. Diet:   Please continue to eat a well-balanced diet. Good nutrition will help the normal cells of your body to repair themselves from the effects of the radiation.    Special Instructions: If you develop diarrhea (frequent liquid, watery bowel movements), you may find it helpful to follow a low-fiber diet. As your diarrhea resolves, slowly introduce high-fiber foods back into your diet. You may also take over the counter Imodium. Continue to drink plenty of fluids to keep yourself hydrated.    3. Activity: If you feel tired, your energy level should gradually improve over the next several weeks. Plan to resume your normal lifestyle activities according to your energy level.    4. Medications:  As prescribed.    5. X-rays/Scans: CT scan scheduled for 06/20/2022. Follow-up with Dr. Luther Redo scheduled for 06/27/2022 to review the scan results. See appointment list for details.    6. Blood Work:  As ordered.    7. Follow-up Appointment: One-month telehome visit with Julie Polka, PA on 04/26/2022. This is a telephone visit. Pam will call you at  your scheduled appointment time.     Thank you for allowing [place] to take part in your care! Please contact us by MyChart or by phone [phone]) if you have any questions or concerns prior to your next scheduled appointment.

## 2022-03-23 ENCOUNTER — Ambulatory Visit: Payer: Medicare Other | Admitting: Radiation Oncology

## 2022-03-23 LAB — RAD ONC ARIA SESSION SUMMARY
Course Elapsed Days: 1
Course Fractions Treated: 2
Plan Fractions Treated to Date: 2
Plan Prescribed Dose Per Fraction: 800 cGY
Plan Total Prescribed Dose: 4000 cGy
Prescribed Total Fractions for Plan - Total: 5
Reference Point Dosage Given to Date: 1600 cGy
Reference Point Session Dosage Given: 800 cGy
Session Number: 2

## 2022-03-23 NOTE — Progress Notes (Addendum)
Radiation Oncology: On Treatment Visit 03/24/2022    Dose: 2400 cGy of 4000 cGy (in 800 cGy/fraction), 3/5 fractions  Radiation Treatments       Active   Plans   [id number]_[id number]   Most recent treatment: Dose planned: 800 cGy (fraction 3 on 03/24/2022)   Total: Dose planned: 4,000 cGy (5 fractions)   Elapsed Days: 2      Reference Points   [id number]   Most recent treatment: Dose given: 800 cGy (on 03/24/2022)   Total: Dose given: 2,400 cGy   Elapsed Days: 2                   Diagnosis:    Cancer Staging   Granulosa cell tumor of ovary  Staging form: Ovary, Fallopian Tube, And Primary Peritoneal Carcinoma, AJCC 8th Edition  - Clinical stage from 03/01/2022: FIGO Stage II (cT2, cN0, cM0) - Unsigned    Site: Left pelvis  Systemic Therapy: Letrozole  Steroids: no  Images Reviewed: yes  Chart Reviewed: yes    SUBJECTIVE    03/24/2022: This is the first week of radiation. Patient accompanied by supportive husband. The patient is tolerating the treatments well. She states she possibly has some slightly increased fatigue but denies any other symptoms. She has baseline abdominal discomfort that is unchanging. No bowel or bladder changes though she does note looser more frequent stools at baseline. Discussed low fiber diet if needed. Discussed follow up and imaging.       OBJECTIVE    Vitals: Wt 71.8 kg (158 lb 3.2 oz)   BMI 30.90 kg/m   Pain score:   Pain    03/24/22 0847   PainSc:   2   PainLoc: Abdomen       Exam:     Performance Status: Score-80- Normal activity with effort, some signs or symptoms of disease.    Alert and oriented, no acute distress    ASSESSMENT/PLAN    - Clinically tolerating radiation.  - Continue treatment course as planned.  - Pain: Controlled  - Nutrition: not a problem  - Skin: no radiation-related toxicities or side effects    The patient was seen and evaluated by Dr. Virgilio Walsh; formal attestation to follow.     Julie Fake, MD  Radiation Oncology PGY4  [facility]     I saw and evaluated  the patient. I agree with the resident's/fellow's findings and plan of care as documented above.  Chart and films reviewed.    This is her first week of radiation treatment.  2/10 abdominal pain, and slight fatigue, not changed since beginning radiation. She has baseline diarrhea, which has also not changed since starting.   Continue RT.   Will follow-up in 05/2022 with CT scan prior    Julie Baseman, MD

## 2022-03-24 ENCOUNTER — Ambulatory Visit: Payer: Medicare Other | Admitting: Radiation Oncology

## 2022-03-24 ENCOUNTER — Encounter: Payer: Medicare Other | Admitting: Radiation Oncology

## 2022-03-24 LAB — RAD ONC ARIA SESSION SUMMARY
Course Elapsed Days: 2
Course Fractions Treated: 3
Plan Fractions Treated to Date: 3
Plan Prescribed Dose Per Fraction: 800 cGY
Plan Total Prescribed Dose: 4000 cGy
Prescribed Total Fractions for Plan - Total: 5
Reference Point Dosage Given to Date: 2400 cGy
Reference Point Session Dosage Given: 800 cGy
Session Number: 3

## 2022-03-24 NOTE — Progress Notes (Signed)
Patient presents for an OTV with Dr. Virgilio Frees. She is accompanied today by her husband. Patient reports 2/10 abdominal pain. States this has not changed since starting radiation. Reports feeling more tired. She has had to scale back her daily activities. Denies bowel or bladder changes. End of treatment AVS reviewed with patient and her husband. Patient verbalized understanding of all instructions. Encouraged patient to contact our office with any questions or concerns. Telehome visit with Rudean Hitt, PA scheduled for 3/27. CT scan scheduled for 5/21 with a follow up with Dr. Virgilio Frees on 5/28.

## 2022-03-27 ENCOUNTER — Ambulatory Visit: Payer: Medicare Other | Admitting: Radiation Oncology

## 2022-03-27 LAB — RAD ONC ARIA SESSION SUMMARY
Course Elapsed Days: 5
Course Fractions Treated: 4
Plan Fractions Treated to Date: 4
Plan Prescribed Dose Per Fraction: 800 cGY
Plan Total Prescribed Dose: 4000 cGy
Prescribed Total Fractions for Plan - Total: 5
Reference Point Dosage Given to Date: 3200 cGy
Reference Point Session Dosage Given: 800 cGy
Session Number: 4

## 2022-03-27 LAB — UNMAPPED LAB RESULTS
Hematocrit (HT): 43 % (ref 35–47)
Hemoglobin (HGB) (HT): 14.1 g/dL (ref 12.0–16.0)
MCHC (HT): 32.6 g/dL (ref 31.0–37.5)
MCV (HT): 94 fL (ref 80–100)
Mean Corpuscular Hemoglobin (MCH) (HT): 30.5 pg (ref 26.0–34.0)
Platelets (HT): 228 10 3/uL (ref 150–450)
RBC (HT): 4.63 10 6/uL (ref 3.80–5.20)
RDW (HT): 12.9 % (ref 0.0–15.2)
WBC (HT): 5.8 10 3/uL (ref 4.0–11.0)

## 2022-03-28 ENCOUNTER — Ambulatory Visit: Payer: Self-pay | Admitting: Student in an Organized Health Care Education/Training Program

## 2022-03-28 ENCOUNTER — Ambulatory Visit: Payer: Medicare Other | Admitting: Radiation Oncology

## 2022-03-28 DIAGNOSIS — C562 Malignant neoplasm of left ovary: Secondary | ICD-10-CM

## 2022-03-28 LAB — RAD ONC ARIA SESSION SUMMARY
Course Elapsed Days: 6
Course Fractions Treated: 5
Plan Fractions Treated to Date: 5
Plan Prescribed Dose Per Fraction: 800 cGY
Plan Total Prescribed Dose: 4000 cGy
Prescribed Total Fractions for Plan - Total: 5
Reference Point Dosage Given to Date: 4000 cGy
Reference Point Session Dosage Given: 800 cGy
Session Number: 5

## 2022-04-12 NOTE — Progress Notes (Unsigned)
Radiation Oncology End of Treatment Summary    Patient Name: [name]  Patient DOB: 04-29-1950  Patient MRN: [id number]    Patient ID: [name] is a 72 y.o. female with ***.    She was treated with external beam radiation therapy as described below.    Stage   Cancer Staging   Granulosa cell tumor of ovary  Staging form: Ovary, Fallopian Tube, And Primary Peritoneal Carcinoma, AJCC 8th Edition  - Clinical stage from 03/01/2022: FIGO Stage II (cT2, cN0, cM0) - Unsigned      Oncologic History    Oncology History    No history exists.       Site Treated: ***    Treatment Dates: 03/22/2022 - 03/28/2022    Concurrent Chemotherapy: ***    Attending Radiation Oncologist: Dr. Virgilio Frees    Delivered Treatment:    Machine Plan ID  Technique [employer]  Dose / Fx (cGy)  Fractions  Total Dose  (cGy)    HH_TB 2024_2_Lt_Pelvis VMAT/SBT 6X FFF 800 5 4000     Field Design: ***    Treatment Summary: The patient underwent CT-based treatment planning and was placed on the simulator couch and immobilized using a customized immobilization device allowing for reproducible positioning. After the target volume and organs at risk were delineated on the treatment planning CT, the patient was planned using our treatment planning system. Beams were shaped with MLCs to improve conformality of target coverage.    An SBRT approach was used by employing dynamic conformal arcs VMAT or treatment delivery.    Daily image guidance (IGRT) with CBCT aligned to PTV interface with sigmoid colon was used to ensure accurate target localization. After review of the dosimetry, the plan was signed by the attending physician and the patient started treatment.    Treatment Course: The patient did well with radiation with no appreciable associated toxicities greater than or equal to grade 2. There were no major deviations from the planned treatment. Summary of treatment course as follows:    ***    Disposition: We plan to see Lenoard Aden in *** months with  *** obtained prior to that appointment. She is welcome to contact us with issues or questions at any time.    Thank you for allowing [place] to participate in the care of this patient.    The patient was seen and evaluated by Dr. Virgilio Frees; formal attestation to follow.     Clydene Fake, MD  Radiation Oncology PGY4  [facility]

## 2022-04-26 ENCOUNTER — Telehealth: Payer: Medicare Other | Attending: Radiation Oncology | Admitting: Radiation Oncology

## 2022-04-26 DIAGNOSIS — C569 Malignant neoplasm of unspecified ovary: Secondary | ICD-10-CM

## 2022-04-26 NOTE — Progress Notes (Signed)
Telephone Visit     This is an established patient visit.    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  She completed EBRT on 03/28/22.    Oncologic History  11/15/2010  - D&C, BSO (Dr. York Grice):   Right ovary: granulosa cell tumor .   Left ovary: benign.   Uterus: hyperplasia without atypia.   07/2018 -  ex-lap, resection of cecum and terminal ileum with primary reanastomosis, appendectomy, total abdominal hysterectomy, optimal tumor debulking, mobilization of the hepatic flexure and lysis of adhesions               12 cm granulosa cell tumor, present on serosal surfaces.  Had adjuvant chemotherapy (Carbo/Taxol)   03/2019 - began Letrozole  05/03/21 - Inhibin B: 43.   11/24/21 - MRI Pelvis: 1.3 cm cystic structure in left adnexa, enhancement, nonspecific  12/05/21 - CT: 2.5 cm left adnexal lesion, stable.   01/26/22 - PET/CT: 2.5 cm lesion in left adnexa with minimal FDG uptake.   01/20/22 - Inhibin B = 63 (normal is <11)     Site Treated: Left pelvis     Treatment Dates: 03/22/2022 - 03/28/2022     Concurrent Systemic Therapy: Letrozole    She continues to have decreased energy level, no significant change in appetite.  She denies any vaginal bleeding.  She denies pain or blood with urination.  Bowel movements continue to be somewhat painful but no blood with bowel movements.    Patient's problem list, allergies, and medications were reviewed and updated as appropriate. Please see the EHR for full details.    Review of Systems   Constitutional:  Positive for malaise/fatigue.   Gastrointestinal:  Negative for blood in stool, constipation and diarrhea.        Pain with BM, not new   Genitourinary:  Negative for dysuria and hematuria.        No vaginal bleeding     Pain    04/26/22 1308   PainSc:   0 - No pain     KPS: 70      Assessment Plan:  She is recuperating 1 month after completing radiation treatments.    CT  abdomen and pelvis scheduled for 06/20/2022.  Follow-up with gynecology oncology on 05/23/2022.  Follow-up here on 06/27/2022.    The plan was discussed with the patient and the patient/patient rep demonstrated understanding to the provider's satisfaction.    Consent was previously obtained from the patient to complete this telephone consult; including the potential for financial liability.    21+ minutes were spent on the phone with the patient, patient representatives, and/or other attendees.       Nithin Demeo, PA

## 2022-05-23 ENCOUNTER — Other Ambulatory Visit
Admission: RE | Admit: 2022-05-23 | Discharge: 2022-05-23 | Disposition: A | Discharge status: Home / Self Care | Payer: Medicare Other | Source: Ambulatory Visit

## 2022-05-23 ENCOUNTER — Ambulatory Visit: Payer: Medicare Other | Attending: Obstetrics and Gynecology | Admitting: Obstetrics and Gynecology

## 2022-05-23 ENCOUNTER — Other Ambulatory Visit: Payer: Self-pay

## 2022-05-23 VITALS — BP 104/60 | HR 88 | Temp 96.1°F | Resp 18 | Ht 60.0 in | Wt 154.6 lb

## 2022-05-23 DIAGNOSIS — Z8543 Personal history of malignant neoplasm of ovary: Secondary | ICD-10-CM | POA: Insufficient documentation

## 2022-05-23 DIAGNOSIS — R197 Diarrhea, unspecified: Secondary | ICD-10-CM | POA: Insufficient documentation

## 2022-05-23 LAB — CA 125: CA 125(eff 4-2010): 16 U/mL (ref 0–34)

## 2022-05-23 NOTE — Progress Notes (Signed)
CC: follow up visit      HPI:  Julie Walsh is a 72 y.o. who presents today for follow up.    Prior history: She has a history of ovarian cancer, diagnosed in February 2020. She is s/p ex-lap, resection of cecum and terminal ileum with primary reanastomosis, appendectomy, total abdominal hysterectomy, optimal tumor debulking, mobilization of the hepatic flexure and lysis of adhesions (she had a D&C, LSO originally with York Grice prior to the larger surgery). Pathology revealed granulosa cell tumor. She received adjuvant chemotherapy (Carbo/Taxol) and is currently on Letrozole. She recently moved from [place].     She reports headaches x 1 year and had an MRA in [place] that showed no evidence of intracranial aneurysm in November 2023. A follow up brain MRI without and with gadolinium was recommended.     Julie Walsh completed EBRT on 03/28/2022 to her left pelvic lesion. She continues on Letrozole.     She presents today feeling well. She denies any vaginal bleeding, discharge, abdominal bloating, change to  bladder function. She does report ongoing diarrhea with occasional constipation- this is not new for her. Reports an episode of greenish stool last night. Denies fevers chills or recent antibiotic use. Also notes intermittent RLQ pain. This has also been ongoing since before radiation. She plans to follow up with her GI given her hx of diverticulosis and colitis.    Inhibin B: 56 (noted from 05/13/2021, inhibin was 43); notes from [facility] indicate it was elevated at the time of diagnosis.   Ca125: 16 (12/19/21)     Latest Reference Range & Units 01/20/22 11:45   CA 125(eff 04-2008) 0 - 34 U/mL 16   Inhibin B <=11 pg/mL 63 (H)   (H): Data is abnormally high    PET CT 01/26/2022:  Patient with history of right ovarian granulosa cell tumor post bilateral salpingo-oophorectomy, hysterectomy.   The 2.5 cm cystic lesion in the left adnexa has minimal FDG uptake.   No definitive evidence of local recurrence or metastases  identified.   FDG avidity within the bilateral palatine tonsils, presumably reactive.       REVIEW OF SYSTEMS:  All others negative, except as noted above.     PHYSICAL EXAM:  BP 104/60 (BP Location: Left arm, Patient Position: Sitting, Cuff Size: adult)   Pulse 88   Temp 35.6 C (96.1 F) (Temporal)   Resp 18   Ht 152.4 cm (5')   Wt 70.1 kg (154 lb 9.6 oz)   SpO2 96%   BMI 30.19 kg/m   Constitutional: She appears well developed and well nourished and in NAD  CV: RRR   Resp: CTA bilaterally   Abd: Soft, slight tenderness to palpation over RLQ, no distention, no guarding or rebound tenderness   Vulva: no suspicious lesions  Vagina: no suspicious lesions  Bimanual, no palpable mass or nodularity   MSK: No edema  Lymph: No cervical or inguinal lymphadenopathy noted   Neuro: A&O x3   Skin: Warm & Dry   Psych: Normal mood and affect.    MDC Discussion 02/17/2022:  Letrozole, add Avastin  Arimidex with Avastin (or alone)  Megace  Target radiation to this left pelvic lesion. Julie Walsh opted for targeted radiation.    IMPRESSION:     72 y.o. female with a history of granulosa cell tumor (left ovary 2012 and recurrence 2020)  On Letrozole  Elevating Inhibin B  [place]iot[place] in 2003 in [place]  Colitis with diverticulosis   Hx DVT, PE on  Eliquis   Trigeminal neuralgia   PET CT 12/28: lesion to left adnexa with minimal FDG uptake   S/p EBRT to pelvic lesion   Diarrhea / constipation alternating   Ongoing RLQ pain    PLAN:     Clinically NED  Will try BRAT diet for diarrhea  Offered c diff sample though low suspicion given no recent abx. She declines today. Will call office should this worsen.   Will also call office with any worsening abdominal pain. Plans to follow up with GI given her hx of colitis and diverticulosis  Continue follow up with Rad Onc as needed   Has CT abdomen/ pelvic scheduled 06/20/22  Will have tumor markers drawn    Will RTC in 3 months with Dr. Katrinka Blazing, or sooner if needed.     Evoleht Hovatter,  NP  05/23/2022  2:43 PM

## 2022-06-06 DIAGNOSIS — I82409 Acute embolism and thrombosis of unspecified deep veins of unspecified lower extremity: Secondary | ICD-10-CM | POA: Insufficient documentation

## 2022-06-06 DIAGNOSIS — K219 Gastro-esophageal reflux disease without esophagitis: Secondary | ICD-10-CM | POA: Insufficient documentation

## 2022-06-06 DIAGNOSIS — M810 Age-related osteoporosis without current pathological fracture: Secondary | ICD-10-CM | POA: Insufficient documentation

## 2022-06-06 DIAGNOSIS — K5730 Diverticulosis of large intestine without perforation or abscess without bleeding: Secondary | ICD-10-CM | POA: Insufficient documentation

## 2022-06-06 DIAGNOSIS — E785 Hyperlipidemia, unspecified: Secondary | ICD-10-CM | POA: Insufficient documentation

## 2022-06-06 DIAGNOSIS — G8929 Other chronic pain: Secondary | ICD-10-CM | POA: Insufficient documentation

## 2022-06-06 DIAGNOSIS — M545 Low back pain, unspecified: Secondary | ICD-10-CM

## 2022-06-06 DIAGNOSIS — C439 Malignant melanoma of skin, unspecified: Secondary | ICD-10-CM | POA: Insufficient documentation

## 2022-06-06 DIAGNOSIS — G521 Disorders of glossopharyngeal nerve: Secondary | ICD-10-CM | POA: Insufficient documentation

## 2022-06-06 HISTORY — DX: Low back pain, unspecified: M54.50

## 2022-06-06 HISTORY — DX: Acute embolism and thrombosis of unspecified deep veins of unspecified lower extremity: I82.409

## 2022-06-06 HISTORY — DX: Other chronic pain: G89.29

## 2022-06-06 LAB — UNMAPPED LAB RESULTS
Basophil # (HT): 0.1 10 3/uL (ref 0.0–0.2)
Basophil % (HT): 1 % (ref 0–2)
Eosinophil # (HT): 0.2 10 3/uL (ref 0.0–0.5)
Eosinophil % (HT): 4 % (ref 0–7)
Hematocrit (HT): 39 % (ref 34–47)
Hemoglobin (HGB) (HT): 13.1 g/dL (ref 11.5–16.0)
Lymphocyte # (HT): 1.6 10 3/uL (ref 0.9–3.8)
Lymphocyte % (HT): 31 % (ref 17–44)
MCHC (HT): 33.9 g/dL (ref 32.0–36.0)
MCV (HT): 92.1 fL (ref 81.0–99.0)
Mean Corpuscular Hemoglobin (MCH) (HT): 31.2 pg (ref 26.0–34.0)
Monocyte # (HT): 0.5 10 3/uL (ref 0.2–1.0)
Monocyte % (HT): 10 % (ref 4–12)
Neutrophil # (HT): 2.8 10 3/uL (ref 1.5–7.7)
Platelets (HT): 220 10 3/uL (ref 150–450)
RBC (HT): 4.2 10 6/uL (ref 3.80–5.20)
RDW (HT): 13.5 % (ref 11.5–15.0)
Seg Neut % (HT): 54 % (ref 40–75)
WBC (HT): 5.2 10 3/uL (ref 4.0–10.8)

## 2022-06-08 ENCOUNTER — Ambulatory Visit
Admission: RE | Admit: 2022-06-08 | Discharge: 2022-06-08 | Disposition: A | Discharge status: Home / Self Care | Payer: Medicare Other | Source: Ambulatory Visit | Attending: Radiation Oncology | Admitting: Radiation Oncology

## 2022-06-08 ENCOUNTER — Other Ambulatory Visit: Payer: Self-pay

## 2022-06-08 DIAGNOSIS — C569 Malignant neoplasm of unspecified ovary: Secondary | ICD-10-CM

## 2022-06-08 DIAGNOSIS — C561 Malignant neoplasm of right ovary: Secondary | ICD-10-CM | POA: Insufficient documentation

## 2022-06-08 LAB — POCT CREATININE
Creatinine, POCT: 1.4 mg/dL — ABNORMAL HIGH (ref 0.51–0.95)
eGFR BY CREAT: 40 *

## 2022-06-08 MED ORDER — IOHEXOL 350 MG/ML (OMNIPAQUE) IV SOLN *I*
1.0000 mL | Freq: Once | INTRAVENOUS | Status: AC
Start: 2022-06-08 — End: 2022-06-08
  Administered 2022-06-08: 100 mL via INTRAVENOUS

## 2022-06-08 MED ORDER — BARIUM SULFATE (REDI-CAT) 2 % PO SUSP *I*
900.0000 mL | Freq: Once | ORAL | Status: AC
Start: 2022-06-08 — End: 2022-06-08
  Administered 2022-06-08: 900 mL via ORAL

## 2022-06-20 ENCOUNTER — Other Ambulatory Visit: Payer: Medicare Other

## 2022-06-22 ENCOUNTER — Other Ambulatory Visit: Payer: Self-pay

## 2022-06-22 ENCOUNTER — Ambulatory Visit: Payer: Medicare Other | Attending: Radiation Oncology | Admitting: Radiation Oncology

## 2022-06-22 VITALS — BP 126/79 | HR 84 | Temp 97.7°F | Wt 150.0 lb

## 2022-06-22 DIAGNOSIS — C569 Malignant neoplasm of unspecified ovary: Secondary | ICD-10-CM | POA: Insufficient documentation

## 2022-06-22 NOTE — Telephone Encounter (Signed)
-----   Message from Mellody Memos, PA-C sent at 06/21/2022  7:55 PM EDT -----  Labs overall stable, kidney function a little worse, avoid nephrotoxic drugs like NSAIDs

## 2022-06-22 NOTE — Progress Notes (Addendum)
Radiation Oncology Follow Up Note 06/22/2022    Patient ID: Julie Walsh is a 72 y.o. female with a diagnosis of recurrent granulosa cell tumor left pelvis.  She completed EBRT on 03/28/22. She returns to our department for a scheduled follow up visit.     Diagnosis (including stage):    Cancer Staging   Granulosa cell tumor of ovary  Staging form: Ovary, Fallopian Tube, And Primary Peritoneal Carcinoma, AJCC 8th Edition  - Clinical stage from 03/01/2022: FIGO Stage II (cT2, cN0, cM0) - Unsigned      Oncologic History:   Oncology History    No history exists.     11/15/2010  - D&C, BSO (Dr. York Grice):   Right ovary: granulosa cell tumor .   Left ovary: benign.   Uterus: hyperplasia without atypia.   07/2018 -  ex-lap, resection of cecum and terminal ileum with primary reanastomosis, appendectomy, total abdominal hysterectomy, optimal tumor debulking, mobilization of the hepatic flexure and lysis of adhesions               12 cm granulosa cell tumor, present on serosal surfaces.  Had adjuvant chemotherapy (Carbo/Taxol)   03/2019 - began Letrozole  05/03/21 - Inhibin B: 43.   11/24/21 - MRI Pelvis: 1.3 cm cystic structure in left adnexa, enhancement, nonspecific  12/05/21 - CT: 2.5 cm left adnexal lesion, stable.   01/26/22 - PET/CT: 2.5 cm lesion in left adnexa with minimal FDG uptake.   01/20/22 - Inhibin B = 63 (normal is <11)  03/28/22 - SBRT to left iliac lesion  4/24 - Inhibin decreased to 47 and left iliac lesion decreased in size after radiation    Current Therapy: Letrozole     Interval History:   She tolerated radiation okay and doesn't have any new issues from radiation or any recent changes. She didn't have a major increase in already present right sided abdominal discomfort which is intermittent. She also has been fatigued for some time but doesn't think this has changed since starting radiation. No issues with nausea or diarrhea after radiation. She also has continued trigeminal neuralgia issues which  are being worked up    Current Medications:  Current Outpatient Medications   Medication Sig    famotidine 20 mg tablet Take 1 tablet (20 mg total) by mouth nightly.    ondansetron (ZOFRAN) 4 mg tablet Take 1 tablet (4 mg total) by mouth every 8 hours as needed.    topiramate (TOPAMAX) 25 mg tablet Take by mouth    tretinoin (RETIN-A) 0.05 % cream SMARTSIG:Topical Every Night    GABAPENTIN 100 MG capsule SMARTSIG:By Mouth    acetaminophen 650 mg CR tablet Take by mouth    alendronate (FOSAMAX) 70 mg tablet Take by mouth    apixaban (ELIQUIS) 5 mg tablet Take 1 tablet (5 mg total) by mouth every 12 hours.    cyclobenzaprine (FLEXERIL) 5 mg tablet Take by mouth    hydrocortisone (ANUSOL-HC) 2.5 % rectal cream     letrozole (FEMARA) 2.5 mg tablet Take by mouth    Multivitamin tablet Take 1 tablet by mouth daily.    pantoprazole (PROTONIX) 40 mg EC tablet     ALPRAZolam (NIRAVAM) 0.25 MG dissolvable tablet Take 1 tablet (0.25 mg total) by mouth nightly as needed for Anxiety.    mesalamine (LIALDA) 1.2 GM EC tablet Take by mouth daily (with breakfast)  Swallow whole. Do not cut or chew.    nystatin (MYCOSTATIN) 100000 UNIT/GM cream Apply topically  2 times daily  to the following areas:    gabapentin (NEURONTIN) 300 MG capsule Take 1 capsule (300 mg total) by mouth 3 times daily.    traZODone (DESYREL) 100 MG tablet Take 1 tablet (100 mg total) by mouth nightly.    ranitidine (ZANTAC) 300 MG tablet Take 1 tablet (300 mg total) by mouth daily.    Cholecalciferol (VITAMIN D3) 1000 UNIT tablet Take 1 tablet (1,000 units total) by mouth daily.       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    Created by Conversion     OP (osteoporosis)     Ovarian cancer 03/2018    Pre-syncope     Pulmonary embolism     Tobacco abuse     Trigeminal neuralgia     Corrected with surgery    Uterine cancer        Social History     Socioeconomic History    Marital status: Married   Tobacco Use    Smoking status: Former     Packs/day: 0.25     Years: 40.00     Additional pack years: 0.00     Total pack years: 10.00     Types: Cigarettes     Quit date: 01/30/2005     Years since quitting: 17.4   Substance and Sexual Activity    Alcohol use: Not Currently     Alcohol/week: 3.0 standard drinks of alcohol     Types: 3 Glasses of wine per week    Drug use: Never       Cancer-related family history includes Cancer in her maternal aunt; Cancer (age of onset: 76) in her niece; Cancer (age of onset: 35) in her paternal uncle; Cancer (age of onset: 82) in an other family member; Cancer (age of onset: 23) in her brother; Cancer (age of onset: 37) in her sister. There is no history of Esophageal cancer or Breast cancer.    Review of Systems:  Negative except as otherwise mentioned above in the Interval History.     Physical Examination  BP 126/79 (BP Location: Left arm, Patient Position: Sitting, Cuff Size: adult)   Pulse 84   Temp 36.5 C (97.7 F)   Wt 68 kg (150 lb)   SpO2 94%   BMI 29.29 kg/m   Pain    06/22/22 1100   PainSc:   4   PainLoc: Abdomen       Performance Status: Score-80- Normal activity with effort, some signs or symptoms of disease.    General: well appearing, appearing stated age, pleasant, no acute distress noted  Skin: Not diaphoretic.  HEENT: EOMI, anicteric sclera, no conjunctivitis noted  Neuro: alert and oriented, visual fields grossly intact, fluent  speech, no dysarthria, no tremor  Lungs: non-labored breathing, able to take  deep breath without coughing  Abdomen: no visible abdominal distention  MSK: moves all extremities spontaneously and with ease  Psych: cooperative, engaged, with linear thought process, appropriate affect    Laboratory Data Reviewed:   Reviewed. No significant abnormalities noted.        Lab results: 01/20/22  1145   Sodium 141   Potassium 4.2   Chloride 104   CO2 27   UN 16   Creatinine 1.11*   Glucose 111*   Calcium 9.5           Lab results: 01/20/22  1145   WBC 5.3   Hemoglobin 13.5   Hematocrit 41   RBC 4.2   Platelets 224       No results for input(s): "PTI", "INR" in the last 8760 hours.        Lab results: 01/20/22  1145   Total Protein 6.8   Albumin 4.4   ALT 29   AST 26   Alk Phos 48   Bilirubin,Total 0.3          Diagnostic Data Reviewed:    CT abdomen and pelvis with contrast    Result Date: 06/08/2022  06/08/2022 10:46 AM CT ABDOMEN AND PELVIS WITH CONTRAST CLINICAL INFORMATION: Ovarian cancer, assess treatment response, Ovarian CA, s/p radiation. Evaluate response to treatment., C56.9-Malignant neoplasm of unspecified ovary. COMPARISON: PET/CT dated 01/26/2022 PROCEDURE:  Contiguous images were obtained through the abdomen and pelvis with intravenous contrast. Automated exposure control, adjustment of the mA and/or kV according to patient size, and/or iterative reconstruction techniques were utilized for radiation dose optimization.  Amount and type of contrast that was injected and/or discarded is recorded in the electronic medical record. FINDINGS: Chest Base: There is mild bibasilar linear atelectasis. Liver/Biliary Tract: There are low-density lesions noted within the liver. The largest located left lobe measures 15 mm diameter (2-24) and measures fluid density with a possible septation. This may've been very faintly seen on the prior PET/CT. This likely represents a cyst. There are subcentimeter hypoattenuating lesions noted within the right hepatic lobe too small to characterize by size criteria.  Status post cholecystectomy. No evidence of intra or extrahepatic bili ductal dilatation. Pancreas: Unremarkable. Spleen: Unremarkable. Adrenals: Unremarkable. Kidneys and Collecting Systems: The kidneys enhance symmetrically bilaterally. No nephrolithiasis noted. There are low-density hypoattenuating subcentimeter renal lesions too small to characterize by size criteria. Lymph Nodes: There is no definite abdominal or pelvic lymphadenopathy by CT size criteria. Vessels: There are calcific atherotic changes of the inferior abdominal aorta. No abdominal aortic aneurysm. The proximal visceral vessels enhance normally. GI Tract/Mesentery and Peritoneal Cavity: There is mild colonic diverticulosis noted. No definite bowel dilatation to suggest mechanical obstruction. A large bowel surgical anastomosis is noted within the right abdomen from prior cecal/terminal ileal resection. No evidence of abdominal or pelvic fluid collections. No pneumoperitoneum. There is a 20 x 19 mm cystic nodule located within the left anterior abdomen (2-89). This is relatively stable in size compared to the prior this is measured 20 x 17 mm in diameter. This was not PET avid on the prior PET/CT from 01/26/2022. Visualized Reproductive Organs: The uterus is surgically absent. There is a left adnexal cyst noted measuring 13 mm diameter (2-130). This is decreased in size compared to the prior PET/CT where this had measured 2.5 cm in diameter. Bladder: Unremarkable. Soft Tissues/Musculoskeletal: No acute abnormality. A dystrophic soft tissue calcification is noted within the  posterior subcutaneous soft tissues of the left pelvis which may be injection related. There are stable degenerative changes of the lumbar spine. No acute osseous lesion is visualized.    1. This patient has granulosa cell tumor involving the right ovary. There is a fluid density structure located within the left anterior abdomen which could represent an entity such as enteric  duplication cyst, small mesenteric cyst or cystic metastases. This did not demonstrate hypermetabolic activity on the prior PET/CT scan. 2. No evidence of significant abdominal or pelvic lymphadenopathy by CT size criteria. 3. The previously noted left adnexal cyst has decreased in size compared to prior imaging. END OF IMPRESSION       [facility] submits this DICOM format image data and final report to the [facility], an independent secure electronic health information exchange, on a reciprocally searchable basis (with patient authorization) for a minimum of 12 months after exam date.      Assessment:  Julie Walsh is a 72 y.o. female with history of recurrent granulosa cell tumor left pelvis.  She completed EBRT on 03/28/22.    After review of recent imaging, She is clinically and radiographically without evidence of disease recurrence/progression. Treated lesion  seems to have responded to radiation well with shrinkage and decrease in inhibin B on recent testing. We will continue to follow  this lesion on imaging as well as follow along with inhibin testing per gyn onc.    There was some concern for the left anterior abdominal fluid density which is essentially unchanged from recent PET scan and was not hypermetabolic on PET scan. We will also continue to monitor this lesion. We discussed with the patient the apparent good response to radiation and we can deliver further radiotherapy to other lesions should they appear    Plan:  - Return to clinic in 3 months for follow up with CT abdomen prior to this appointment.   - Continue follow up with gyn onc on 08/17/22  - She was instructed to contact us with any questions or concerns in the interim.     The patient was also seen and evaluated by Dr. Luther Redo; formal attestation to follow.     Roxan Diesel MD  Radiation Oncology PGY 5    I saw and evaluated the patient. I agree with the resident's/fellow's findings and plan of care as documented. Details of my  evaluation are as follows:     Julie Walsh was seen today along with her husband for a follow-up visit regarding her recurrent granulosa cell tumor of the ovary. She had initial surgery in 2012 and recurrence in 2020. She underwent resection of a 12 cm tumor, with resection of cecum and ileum with primary reanastomosis in 07/2018 in [place]. She underwent adjuvant Carbo/Taxol, then began Letrozole in 03/2019.   PET/CT scan showed a 2.5 cm region in the left adnexa, measuring 2.5 cm in left adnexa.   She underwent SBRT radiation to this solitary metastasis in 03/2022.     She is feeling overall fairly well. She continues to have right lower quadrant abdominal pain on and off and loose or watery bowel movements, which has gone on since her surgery in 2020. Over the past year, she has noted some nausea and anorexia, and has lost some weight recently (10 pounds over the past 6 months). She doesn't note any changes in this since her radiation treatment.     CT scan on 06/08/22 shows reduction of size of the left adnexal  cyst (from 2.5 cm to 1.3 cm). There is a separate area in the left anterior abdomen, a cystic nodule which is unchanged and which was not FDG avid.   Inhibin B levels have decreased from 63 to 47.     She has had an excellent response to radiation.   No side effects form treatment. She has chronic bowel symptoms which began after her surgery in 2020. She is working with her gastroetnterologist regarding this.   She will follow-up with Dr. Katrinka Blazing in 07/2022, and we will see her again in Oct 2024.     [name], MD

## 2022-06-22 NOTE — Addendum Note (Signed)
Addended by: Londell Moh on: 06/22/2022 01:23 PM     Modules accepted: Orders

## 2022-06-22 NOTE — Telephone Encounter (Signed)
Pt understands and verbalizes plan of care.

## 2022-06-27 ENCOUNTER — Ambulatory Visit: Payer: Medicare Other | Admitting: Radiation Oncology

## 2022-08-11 NOTE — Progress Notes (Addendum)
CC: follow up visit      HPI:  Julie Walsh is a 72 y.o. who presents today for follow up.    Prior history: She has a history of ovarian cancer, diagnosed in February 2020. She is s/p ex-lap, resection of cecum and terminal ileum with primary reanastomosis, appendectomy, total abdominal hysterectomy, optimal tumor debulking, mobilization of the hepatic flexure and lysis of adhesions (she had a D&C, LSO originally with Julie Walsh prior to the larger surgery). Pathology revealed granulosa cell tumor. She received adjuvant chemotherapy (Carbo/Taxol) and is currently on Letrozole. She recently moved from [place].     Julie Walsh completed EBRT on 03/28/2022 to her left pelvic lesion. She continues on Letrozole. (Disussion at [facility] meeting 02/17/2022).     She presents today feeling well. Continues with diarrhea. She does not take imodium, as this makes her constipated. Has some intermittent abdominal pain.     CT abdomen and pelvis ordered 5/23 scheduled 09/25/2022.      Inhibin B   Latest Ref Rng <=11 pg/mL   01/20/2022 63 (H)    05/23/2022 47 (H)    **most recent inhibin is pending    REVIEW OF SYSTEMS:  All others negative, except as noted above.     PHYSICAL EXAM:  BP 132/70 (BP Location: Left arm, Patient Position: Sitting, Cuff Size: adult)   Pulse 81   Temp 35.7 C (96.3 F) (Tympanic)   Resp 20   Ht 152.4 cm (5')   Wt 67.6 kg (149 lb)   SpO2 97%   BMI 29.10 kg/m   Constitutional: She appears well developed and well nourished and in NAD  CV: RRR   Resp: CTA bilaterally   Abd: Soft, no distention, no guarding or rebound tenderness   Vulva: no suspicious lesions  Vagina: no suspicious lesions  Bimanual, no palpable mass or nodularity   MSK: No edema  Lymph: No cervical or inguinal lymphadenopathy noted   Neuro: A&O x3   Skin: Warm & Dry   Psych: Normal mood and affect.    MDC Discussion 02/17/2022:  Letrozole, add Avastin  Arimidex with Avastin (or alone)  Megace  Target radiation to this left pelvic lesion. Julie Walsh  opted for targeted radiation.    IMPRESSION:     73 y.o. female with a history of granulosa cell tumor (left ovary 2012 and recurrence 2020)  On Letrozole  S/p EBRT to pelvic lesion   - [place]iot[place] in 2003 in [place]  - Colitis with diverticulosis   - Hx DVT, PE on Eliquis   - Trigeminal neuralgia   - Diarrhea    PLAN:     Completion of radiation.   Plan for repeat CT in August as ordered.   Continue follow up with Rad Onc as needed   Continue Letrozole   Will have tumor markers drawn; orders have been placed.   RTC in 3 months or sooner if needed.     Julie Gossett Kathaleen Grinder, DO  08/17/2022  1:34 PM    Most recent Inhibin value is 50. We will plan for the CT as ordered to be completed 08/2022.     Julie Neat, DO  08/21/2022  12:44 PM

## 2022-08-15 ENCOUNTER — Telehealth: Payer: Self-pay

## 2022-08-15 NOTE — Telephone Encounter (Signed)
Call to patient. She will have CA-125 and Inhibin b drawn today at [facility] lab. Orders faxed to [phone].

## 2022-08-16 ENCOUNTER — Encounter: Payer: Self-pay | Admitting: Gastroenterology

## 2022-08-17 ENCOUNTER — Encounter: Payer: Self-pay | Admitting: Obstetrics & Gynecology

## 2022-08-17 ENCOUNTER — Other Ambulatory Visit: Payer: Self-pay

## 2022-08-17 ENCOUNTER — Ambulatory Visit: Payer: Medicare Other | Attending: Obstetrics & Gynecology | Admitting: Obstetrics & Gynecology

## 2022-08-17 VITALS — BP 132/70 | HR 81 | Temp 96.3°F | Resp 20 | Ht 60.0 in | Wt 149.0 lb

## 2022-08-17 DIAGNOSIS — Z8543 Personal history of malignant neoplasm of ovary: Secondary | ICD-10-CM | POA: Insufficient documentation

## 2022-08-17 LAB — CA 125: CA 125(eff 4-2010): 15.4 U/ML

## 2022-08-17 LAB — INHIBIN B

## 2022-09-12 LAB — UNMAPPED LAB RESULTS
Basophil # (HT): 0.1 10 3/uL (ref 0.0–0.2)
Basophil % (HT): 1 % (ref 0–2)
Eosinophil # (HT): 0.3 10 3/uL (ref 0.0–0.5)
Eosinophil % (HT): 4 % (ref 0–7)
Hematocrit (HT): 44 % (ref 34–47)
Hemoglobin (HGB) (HT): 14.3 g/dL (ref 11.5–16.0)
Lymphocyte # (HT): 1.8 10 3/uL (ref 0.9–3.8)
Lymphocyte % (HT): 27 % (ref 17–44)
MCHC (HT): 32.4 g/dL (ref 32.0–36.0)
MCV (HT): 95.7 fL (ref 81.0–99.0)
Mean Corpuscular Hemoglobin (MCH) (HT): 31 pg (ref 26.0–34.0)
Monocyte # (HT): 0.6 10 3/uL (ref 0.2–1.0)
Monocyte % (HT): 9 % (ref 4–12)
Neutrophil # (HT): 4 10 3/uL (ref 1.5–7.7)
Platelets (HT): 254 10 3/uL (ref 150–450)
RBC (HT): 4.62 10 6/uL (ref 3.80–5.20)
RDW (HT): 12.6 % (ref 11.5–15.0)
Seg Neut % (HT): 59 % (ref 40–75)
WBC (HT): 6.7 10 3/uL (ref 4.0–10.8)

## 2022-09-22 ENCOUNTER — Other Ambulatory Visit
Admission: RE | Admit: 2022-09-22 | Discharge: 2022-09-22 | Disposition: A | Discharge status: Home / Self Care | Payer: Medicare Other | Source: Ambulatory Visit | Attending: Obstetrics & Gynecology | Admitting: Obstetrics & Gynecology

## 2022-09-22 DIAGNOSIS — Z8543 Personal history of malignant neoplasm of ovary: Secondary | ICD-10-CM | POA: Insufficient documentation

## 2022-09-22 LAB — CA 125: CA 125(eff 4-2010): 14 U/mL (ref 0–34)

## 2022-09-22 NOTE — Telephone Encounter (Signed)
Last Visit: 03/15/22   Upcoming Visit: Visit date not found       Front: Return in about 2 months (around 05/14/2022), or if symptoms worsen or fail to improve.      [x]  The patient is currently using this medication at this dose.   *The requested medication is on the current medication list at the requested dose/frequency. Did not verify that patient is "using" this medication.      Lab Results   Component Value Date    HBA1C 5.6 06/06/2021    LDLC 81 06/17/2022    TSH 1.31 06/17/2022    GLU 97 09/12/2022    CA 10.0 09/12/2022    NA 139 09/12/2022    K 4.2 09/12/2022    CL 104 09/12/2022    BUN 16 09/12/2022    CREAT 1.3 (H) 09/12/2022    AST 29 09/12/2022       BP Readings from Last 1 Encounters:   03/27/22 117/58

## 2022-09-25 ENCOUNTER — Other Ambulatory Visit: Payer: Self-pay | Admitting: Student in an Organized Health Care Education/Training Program

## 2022-09-25 ENCOUNTER — Ambulatory Visit
Admission: RE | Admit: 2022-09-25 | Discharge: 2022-09-25 | Disposition: A | Discharge status: Home / Self Care | Payer: Medicare Other | Source: Ambulatory Visit | Attending: Radiation Oncology | Admitting: Radiation Oncology

## 2022-09-25 ENCOUNTER — Other Ambulatory Visit: Payer: Self-pay

## 2022-09-25 DIAGNOSIS — C569 Malignant neoplasm of unspecified ovary: Secondary | ICD-10-CM

## 2022-09-25 DIAGNOSIS — K5730 Diverticulosis of large intestine without perforation or abscess without bleeding: Secondary | ICD-10-CM | POA: Insufficient documentation

## 2022-09-25 MED ORDER — BARIUM SULFATE (REDI-CAT) 2 % PO SUSP *I*
900.0000 mL | Freq: Once | ORAL | Status: AC
Start: 2022-09-25 — End: 2022-09-25
  Administered 2022-09-25: 900 mL via ORAL

## 2022-09-25 MED ORDER — IOHEXOL 350 MG/ML (OMNIPAQUE) IV SOLN *I*
1.0000 mL | Freq: Once | INTRAVENOUS | Status: AC
Start: 2022-09-25 — End: 2022-09-25
  Administered 2022-09-25: 100 mL via INTRAVENOUS

## 2022-09-28 LAB — INHIBIN B: Inhibin B: 54 pg/mL — ABNORMAL HIGH (ref <=11)

## 2022-10-06 ENCOUNTER — Ambulatory Visit: Payer: Medicare Other | Attending: Radiation Oncology | Admitting: Radiation Oncology

## 2022-10-06 ENCOUNTER — Encounter: Payer: Self-pay | Admitting: Radiation Oncology

## 2022-10-06 VITALS — BP 149/81 | HR 75 | Temp 97.9°F | Wt 150.4 lb

## 2022-10-06 DIAGNOSIS — D3910 Neoplasm of uncertain behavior of unspecified ovary: Secondary | ICD-10-CM | POA: Insufficient documentation

## 2022-10-06 NOTE — Progress Notes (Signed)
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 She had initial surgery in 2012 and recurrence in 2020. She underwent resection of a 12 cm tumor, with resection of cecum and ileum with primary reanastomosis in 07/2018 in [place]. She underwent adjuvant Carbo/Taxol, then began Letrozole in 03/2019.   PET/CT scan showed a 2.5 cm region in the left adnexa, measuring 2.5 cm in left adnexa.   She underwent SBRT radiation to this solitary metastasis in 03/2022 (8 Gy x 5)   CT scan on 06/08/22 shows reduction of size of the left adnexal cyst (from 2.5 cm to 1.3 cm). Inhibin B level decreased from 63 to 47.      Interval History:   Julie Walsh is feeling fairly well. She has had diarrhea for a while after her radiation treatment, but this is essentially gone at this point. She does still have cramping pain with a bowel movement, not before or after. She does strain for bowel movements generally. She occasionally has had blood in the stool.     No urinary symptoms. No pelvic pain, vaginal discharge, vaginal or rectal bleeding.      She has abdominal discomfort in the lower central abdomen.     On complete review of systems, she denies any other areas of pain, fatigue, poor appetite, nausea, vomiting, diarrhea, constipation, weight loss, fevers, sweats, headaches, cough, shortness of breath, or mood changes.     Current Outpatient Medications   Medication    famotidine 20 mg tablet    tretinoin (RETIN-A) 0.05 % cream    GABAPENTIN 100 MG capsule    acetaminophen 650 mg CR tablet    apixaban (ELIQUIS) 5 mg tablet    cyclobenzaprine (FLEXERIL) 5 mg tablet    hydrocortisone (ANUSOL-HC) 2.5 % rectal cream    letrozole (FEMARA) 2.5 mg tablet    Multivitamin tablet    pantoprazole (PROTONIX) 40 mg EC tablet    ALPRAZolam (NIRAVAM) 0.25 MG dissolvable tablet    mesalamine (LIALDA) 1.2 GM EC tablet    nystatin  (MYCOSTATIN) 100000 UNIT/GM cream    gabapentin (NEURONTIN) 300 MG capsule    traZODone (DESYREL) 100 MG tablet    ranitidine (ZANTAC) 300 MG tablet    Cholecalciferol (VITAMIN D3) 1000 UNIT tablet     No current facility-administered medications for this visit.       Physical Exam:   Well developed, well nourished woman in no acute distress.  Very pleasant and cooperative.  BP 149/81 (BP Location: Right arm, Patient Position: Sitting, Cuff Size: adult)   Pulse 75   Temp 36.6 C (97.9 F)   Wt 68.2 kg (150 lb 6.4 oz)   SpO2 96%   BMI 29.37 kg/m . KPS 90%. There is no palpable supraclavicular or cervical lymphadenopathy.  Heart is in regular rate and rhythm with no murmurs, rubs, or gallops. Lungs are clear to auscultation bilaterally.     Abdomen is soft with normal bowel sounds.   Tenderness to palpation in the suprapubic region.     Imaging Studies:   09/25/22 - CT abdomen/pelvis: Enlarging 29 mm cystic lesion posterior to the left abdominal wall (was previously 24 mm).  An 8 mm cystic lesion in the left adnexal region is decreased from 13 mm series 2 image 134 (THIS was the radiated area).  A 16 mm soft tissue nodule medial to the left external  iliac vessels is stable series 2 image 126.  There is a stable 18 mm soft tissue attenuation lesion between the 2 left-sided small bowel loops. Other findings as above.    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 She has had an excellent response in the area.   Most recent scans show a slowly enlarging area in the anterior lower abdomen, which appears to be causing discomfort for her.   She continues on Letrozole.     We discussed management options for her. Radiation has worked well for her previous cystic mass, but she had a long bout of diarrhea after  her radiation. The current lesion is even closer to bowel, particularly small bowel. I would not favor doing radiation therapy at this time. If we did I would likely go to a lower dose and expect her to have bowel symptoms again.     I recommended that she follow-up with Dr. Estill Bakes whom she has an appointment next month, to discuss different options (e.g. different hormonal therapy or observation)    Plan:   Follow-up with Dr. Mayford Knife in 6 months.   Julie Walsh will follow in our office in 6 months' time.      [name] MD

## 2022-10-09 ENCOUNTER — Telehealth: Payer: Self-pay

## 2022-10-09 ENCOUNTER — Telehealth: Payer: Self-pay | Admitting: Obstetrics & Gynecology

## 2022-10-09 NOTE — Telephone Encounter (Signed)
Spoke with patient and rescheduled patient to Dr Michaelle Copas schedule per her concern of a new tumor.

## 2022-10-12 NOTE — Progress Notes (Signed)
CC: Follow up visit      HPI:  Julie Walsh is a 72 y.o. who presents today for follow up.    Prior history: She has a history of ovarian cancer, diagnosed in February 2020. She is s/p ex-lap, resection of cecum and terminal ileum with primary reanastomosis, appendectomy, total abdominal hysterectomy, optimal tumor debulking, mobilization of the hepatic flexure and lysis of adhesions (she had a D&C, LSO originally with Julie Walsh prior to the larger surgery). Pathology revealed granulosa cell tumor. She received adjuvant chemotherapy (Carbo/Taxol) and is currently on Letrozole. She recently moved from [place].     Julie Walsh completed EBRT on 03/28/2022 to her left pelvic lesion. She continues on Letrozole. (Disussion at [facility] meeting 02/17/2022).     She presents today feeling overall well. Continues to struggle with diarrhea after radiation. She was seen by Dr Luther Redo on 9/6 and he did not favor doing more radiation therapy at this time, as the lesion is very close to the bowel, and she is already having great difficulty with diarrhea. She also says that the abdominal wall mass is causing pain, which she first noticed when her dog was on her lap and pressed on the area.  She recently had a large bout of diarrhea, which was bloody. She is unsure if there was any bleeding coming from the vagina.     CT A/P 09/25/2022:  There is an enlarging 29 mm cystic lesion posterior to the left abdominal wall series 2 image 90 and on coronal series 601 image 27, this previously measured up to 24 mm.     Enlarging 29 mm cystic lesion posterior to the left abdominal wall.   An 8 mm cystic lesion in the left adnexal region is decreased from 13 mm series 2 image 134.   A 16 mm soft tissue nodule medial to the left external iliac vessels is stable series 2 image 126.   There is a stable 18 mm soft tissue attenuation lesion between the 2 left-sided small bowel loops. Other findings as above.   Improved appearance of sigmoid diverticulosis  with less colonic wall thickening.      Inhibin B   Latest Ref Rng <=11 pg/mL   01/20/2022 63 (H)    05/23/2022 47 (H)    08/16/2022 Pending (E)   09/22/2022 54 (H)       REVIEW OF SYSTEMS:  All others negative, except as noted above.     PHYSICAL EXAM:  BP 138/80 (BP Location: Left arm, Patient Position: Sitting, Cuff Size: adult)   Pulse 90   Temp 36.3 C (97.3 F) (Temporal)   Resp 20   Ht 152.4 cm (5')   Wt 68 kg (150 lb)   SpO2 97%   BMI 29.29 kg/m   Constitutional: She appears well developed and well nourished and in NAD  CV: RRR   Resp: CTA bilaterally   Abd: Soft, no distention, no guarding or rebound tenderness   Vulva: no suspicious lesions  Vagina: no suspicious lesions  Bimanual, small pelvic nodule appreciated, corresponding with unchanged known pelvic nodule on imaging  MSK: No edema  Lymph: No cervical or inguinal lymphadenopathy noted   Neuro: A&O x3   Skin: Warm & Dry   Psych: Normal mood and affect.    MDC Discussion 02/17/2022:  Letrozole, add Avastin  Arimidex with Avastin (or alone)  Megace  Target radiation to this left pelvic lesion. Julie Walsh opted for targeted radiation.    IMPRESSION:  72 y.o. female with a history of granulosa cell tumor (left ovary 2012 and recurrence 2020)  On Letrozole  S/p EBRT to pelvic lesion   Small interval growth of abdominal mass (29mm, increased from 24mm)  - [place] in 2003 in [place]  - Colitis with diverticulosis   - Hx DVT, PE on Eliquis   - Trigeminal neuralgia   - Diarrhea    PLAN:     -Discussed the imaging findings, which showed largely stable disease. Very small increase in size of one nodule, but otherwise no other growth. Recommended close follow up, due to proximity of the mass to the small bowel, which is also in a previously radiated field. Explained if surgery were performed, she is at high risk for needing bowel surgery and possible ostomy, at this time risk outweighs the benefit.    -Continue Letrozole    RTC in 3 months or sooner if  needed.     Tilman Neat, DO  10/13/2022  2:21 PM

## 2022-10-13 ENCOUNTER — Other Ambulatory Visit: Payer: Self-pay

## 2022-10-13 ENCOUNTER — Ambulatory Visit: Payer: Medicare Other | Attending: Obstetrics & Gynecology | Admitting: Obstetrics & Gynecology

## 2022-10-13 VITALS — BP 138/80 | HR 90 | Temp 97.3°F | Resp 20 | Ht 60.0 in | Wt 150.0 lb

## 2022-10-13 DIAGNOSIS — Z8543 Personal history of malignant neoplasm of ovary: Secondary | ICD-10-CM | POA: Insufficient documentation

## 2022-10-17 ENCOUNTER — Ambulatory Visit: Payer: Medicare Other | Admitting: Oncology

## 2022-10-18 NOTE — Telephone Encounter (Signed)
Has new provider. Sent out release

## 2022-11-22 ENCOUNTER — Ambulatory Visit: Payer: Medicare Other | Admitting: Oncology

## 2022-11-22 ENCOUNTER — Ambulatory Visit: Payer: Medicare Other | Admitting: Radiation Oncology

## 2022-11-28 ENCOUNTER — Ambulatory Visit: Payer: Medicare Other | Admitting: Radiation Oncology

## 2022-11-29 ENCOUNTER — Emergency Department: Payer: Medicare Other

## 2022-11-29 ENCOUNTER — Other Ambulatory Visit: Payer: Self-pay

## 2022-11-29 ENCOUNTER — Emergency Department: Payer: Medicare Other | Admitting: Radiology

## 2022-11-29 ENCOUNTER — Inpatient Hospital Stay
Admission: EM | Admit: 2022-11-29 | Discharge: 2022-11-30 | DRG: 065 | Disposition: A | Discharge status: Home / Self Care | Payer: Medicare Other | Source: Ambulatory Visit | Attending: Neurology | Admitting: Neurology

## 2022-11-29 ENCOUNTER — Inpatient Hospital Stay: Payer: Medicare Other

## 2022-11-29 DIAGNOSIS — R42 Dizziness and giddiness: Principal | ICD-10-CM

## 2022-11-29 DIAGNOSIS — K219 Gastro-esophageal reflux disease without esophagitis: Secondary | ICD-10-CM | POA: Diagnosis present

## 2022-11-29 DIAGNOSIS — R2689 Other abnormalities of gait and mobility: Secondary | ICD-10-CM

## 2022-11-29 DIAGNOSIS — Z85820 Personal history of malignant melanoma of skin: Secondary | ICD-10-CM

## 2022-11-29 DIAGNOSIS — J449 Chronic obstructive pulmonary disease, unspecified: Secondary | ICD-10-CM | POA: Diagnosis present

## 2022-11-29 DIAGNOSIS — M797 Fibromyalgia: Secondary | ICD-10-CM | POA: Diagnosis present

## 2022-11-29 DIAGNOSIS — K5730 Diverticulosis of large intestine without perforation or abscess without bleeding: Secondary | ICD-10-CM

## 2022-11-29 DIAGNOSIS — R29818 Other symptoms and signs involving the nervous system: Secondary | ICD-10-CM

## 2022-11-29 DIAGNOSIS — I6789 Other cerebrovascular disease: Secondary | ICD-10-CM | POA: Diagnosis present

## 2022-11-29 DIAGNOSIS — I6389 Other cerebral infarction: Principal | ICD-10-CM | POA: Diagnosis present

## 2022-11-29 DIAGNOSIS — F32A Depression, unspecified: Secondary | ICD-10-CM | POA: Diagnosis present

## 2022-11-29 DIAGNOSIS — K5010 Crohn's disease of large intestine without complications: Secondary | ICD-10-CM | POA: Insufficient documentation

## 2022-11-29 DIAGNOSIS — Z7901 Long term (current) use of anticoagulants: Secondary | ICD-10-CM

## 2022-11-29 DIAGNOSIS — I10 Essential (primary) hypertension: Secondary | ICD-10-CM | POA: Diagnosis present

## 2022-11-29 DIAGNOSIS — G4700 Insomnia, unspecified: Secondary | ICD-10-CM | POA: Diagnosis present

## 2022-11-29 DIAGNOSIS — I671 Cerebral aneurysm, nonruptured: Secondary | ICD-10-CM

## 2022-11-29 DIAGNOSIS — R270 Ataxia, unspecified: Secondary | ICD-10-CM | POA: Diagnosis present

## 2022-11-29 DIAGNOSIS — R29702 NIHSS score 2: Secondary | ICD-10-CM | POA: Diagnosis present

## 2022-11-29 DIAGNOSIS — Z85819 Personal history of malignant neoplasm of unspecified site of lip, oral cavity, and pharynx: Secondary | ICD-10-CM

## 2022-11-29 DIAGNOSIS — C7880 Secondary malignant neoplasm of unspecified digestive organ: Secondary | ICD-10-CM | POA: Diagnosis present

## 2022-11-29 DIAGNOSIS — Z87891 Personal history of nicotine dependence: Secondary | ICD-10-CM

## 2022-11-29 DIAGNOSIS — C569 Malignant neoplasm of unspecified ovary: Secondary | ICD-10-CM | POA: Diagnosis present

## 2022-11-29 DIAGNOSIS — R9431 Abnormal electrocardiogram [ECG] [EKG]: Secondary | ICD-10-CM

## 2022-11-29 DIAGNOSIS — R200 Anesthesia of skin: Secondary | ICD-10-CM

## 2022-11-29 DIAGNOSIS — R531 Weakness: Secondary | ICD-10-CM

## 2022-11-29 DIAGNOSIS — I639 Cerebral infarction, unspecified: Secondary | ICD-10-CM

## 2022-11-29 DIAGNOSIS — Z789 Other specified health status: Secondary | ICD-10-CM

## 2022-11-29 DIAGNOSIS — R260 Ataxic gait: Secondary | ICD-10-CM

## 2022-11-29 DIAGNOSIS — Z86711 Personal history of pulmonary embolism: Secondary | ICD-10-CM

## 2022-11-29 DIAGNOSIS — I726 Aneurysm of vertebral artery: Secondary | ICD-10-CM | POA: Diagnosis present

## 2022-11-29 DIAGNOSIS — E785 Hyperlipidemia, unspecified: Secondary | ICD-10-CM | POA: Diagnosis present

## 2022-11-29 DIAGNOSIS — H538 Other visual disturbances: Secondary | ICD-10-CM | POA: Diagnosis present

## 2022-11-29 DIAGNOSIS — Z86718 Personal history of other venous thrombosis and embolism: Secondary | ICD-10-CM

## 2022-11-29 DIAGNOSIS — G500 Trigeminal neuralgia: Secondary | ICD-10-CM

## 2022-11-29 HISTORY — DX: Diverticulosis of large intestine without perforation or abscess without bleeding: K57.30

## 2022-11-29 HISTORY — DX: Trigeminal neuralgia: G50.0

## 2022-11-29 LAB — BASIC METABOLIC PANEL
Anion Gap: 13 (ref 7–16)
CO2: 25 mmol/L (ref 20–28)
Calcium: 9.7 mg/dL (ref 8.6–10.2)
Chloride: 103 mmol/L (ref 96–108)
Creatinine: 1.27 mg/dL — ABNORMAL HIGH (ref 0.51–0.95)
Glucose: 117 mg/dL — ABNORMAL HIGH (ref 60–99)
Lab: 14 mg/dL (ref 6–20)
Potassium: 4.2 mmol/L (ref 3.3–5.1)
Sodium: 141 mmol/L (ref 133–145)
eGFR BY CREAT: 44 * — AB

## 2022-11-29 LAB — URINALYSIS WITH MICROSCOPIC
Bacteria,UA: NONE SEEN
Blood,UA: NEGATIVE
Glucose,UA: NEGATIVE
Hyaline Casts,UA: NONE SEEN /[LPF] (ref 0–5)
Ketones, UA: NEGATIVE
Nitrite,UA: NEGATIVE
Protein,UA: NEGATIVE
Specific Gravity,UA: 1.043 — ABNORMAL HIGH (ref 1.002–1.030)
pH,UA: 8 (ref 5.0–8.0)

## 2022-11-29 LAB — LIPID PANEL
Chol/HDL Ratio: 2.6
Cholesterol: 205 mg/dL — AB
HDL: 78 mg/dL — ABNORMAL HIGH (ref 40–60)
LDL Calculated: 107 mg/dL
Non HDL Cholesterol: 127 mg/dL
Triglycerides: 100 mg/dL

## 2022-11-29 LAB — PROTIME-INR
INR: 1.2 — ABNORMAL HIGH (ref 0.9–1.1)
Protime: 13.3 s — ABNORMAL HIGH (ref 10.0–12.9)

## 2022-11-29 LAB — RUQ PANEL (ED ONLY)
ALT: 19 U/L (ref 0–35)
AST: 25 U/L (ref 0–35)
Albumin: 4.2 g/dL (ref 3.5–5.2)
Alk Phos: 61 U/L (ref 35–105)
Amylase: 48 U/L (ref 28–100)
Bili,Indirect: 0.3 mg/dL (ref 0.1–1.0)
Bilirubin,Direct: 0.2 mg/dL (ref 0.0–0.3)
Bilirubin,Total: 0.5 mg/dL (ref 0.0–1.2)
Lipase: 53 U/L (ref 13–60)
Total Protein: 7.1 g/dL (ref 6.3–7.7)

## 2022-11-29 LAB — CBC AND DIFFERENTIAL
Baso # K/uL: 0.1 10*3/uL (ref 0.0–0.2)
Eos # K/uL: 0.2 10*3/uL (ref 0.0–0.5)
Hematocrit: 42 % (ref 34–49)
Hemoglobin: 14 g/dL (ref 11.2–16.0)
IMM Granulocytes #: 0 10*3/uL (ref 0.0–0.0)
IMM Granulocytes: 0.4 %
Lymph # K/uL: 1.8 10*3/uL (ref 1.0–5.0)
MCV: 92 fL (ref 75–100)
Mono # K/uL: 0.5 10*3/uL (ref 0.1–1.0)
Neut # K/uL: 3 10*3/uL (ref 1.5–6.5)
Nucl RBC # K/uL: 0 10*3/uL (ref 0.0–0.0)
Nucl RBC %: 0 /100{WBCs} (ref 0.0–0.2)
Platelets: 211 10*3/uL (ref 150–450)
RBC: 4.6 MIL/uL (ref 4.0–5.5)
RDW: 12.6 % (ref 0.0–15.0)
Seg Neut %: 53.7 %
WBC: 5.6 10*3/uL (ref 3.5–11.0)

## 2022-11-29 LAB — HOLD GRAY

## 2022-11-29 LAB — TYPE AND SCREEN
ABO RH Blood Type: B NEG
Antibody Screen: NEGATIVE

## 2022-11-29 LAB — APTT: aPTT: 29.2 s (ref 25.8–37.9)

## 2022-11-29 LAB — HOLD GREEN WITH GEL

## 2022-11-29 MED ORDER — TRAZODONE HCL 100 MG PO TABS *I*
100.0000 mg | ORAL_TABLET | Freq: Every evening | ORAL | Status: DC
Start: 2022-11-29 — End: 2022-11-30
  Administered 2022-11-29: 100 mg via ORAL
  Filled 2022-11-29: qty 2

## 2022-11-29 MED ORDER — IOHEXOL 350 MG/ML (OMNIPAQUE) IV SOLN 500ML BOTTLE *I*
1.0000 mL | Freq: Once | INTRAVENOUS | Status: AC
Start: 2022-11-29 — End: 2022-11-29
  Administered 2022-11-29: 70 mL via INTRAVENOUS

## 2022-11-29 MED ORDER — SODIUM CHLORIDE 0.9 % FLUSH FOR PUMPS *I*
0.0000 mL/h | INTRAVENOUS | Status: DC | PRN
Start: 2022-11-29 — End: 2023-01-28

## 2022-11-29 MED ORDER — SENNOSIDES 8.6 MG PO TABS *I*
1.0000 | ORAL_TABLET | Freq: Every evening | ORAL | Status: DC
Start: 2022-11-29 — End: 2022-11-30
  Filled 2022-11-29: qty 1

## 2022-11-29 MED ORDER — LETROZOLE 2.5 MG PO TABS *I*
2.5000 mg | ORAL_TABLET | Freq: Every day | ORAL | Status: DC
Start: 2022-11-29 — End: 2022-11-30
  Administered 2022-11-30: 2.5 mg via ORAL
  Filled 2022-11-29 (×2): qty 1

## 2022-11-29 MED ORDER — PANTOPRAZOLE SODIUM 40 MG PO TBEC *I*
40.0000 mg | DELAYED_RELEASE_TABLET | Freq: Every morning | ORAL | Status: DC
Start: 2022-11-30 — End: 2022-11-30
  Administered 2022-11-30: 40 mg via ORAL
  Filled 2022-11-29: qty 1

## 2022-11-29 MED ORDER — MELATONIN 3 MG PO TABS *I*
3.0000 mg | ORAL_TABLET | Freq: Every day | ORAL | Status: DC
Start: 2022-11-29 — End: 2022-11-30
  Administered 2022-11-29: 3 mg via ORAL
  Filled 2022-11-29: qty 1

## 2022-11-29 MED ORDER — ATORVASTATIN CALCIUM 40 MG PO TABS *I*
40.0000 mg | ORAL_TABLET | Freq: Every day | ORAL | Status: DC
Start: 2022-11-29 — End: 2023-01-28
  Administered 2022-11-29: 40 mg via ORAL
  Filled 2022-11-29: qty 1

## 2022-11-29 MED ORDER — ENOXAPARIN SODIUM 40 MG/0.4ML IJ SOSY *I*
40.0000 mg | PREFILLED_SYRINGE | Freq: Every day | INTRAMUSCULAR | Status: DC
Start: 2022-11-29 — End: 2022-11-30
  Administered 2022-11-29: 40 mg via SUBCUTANEOUS
  Filled 2022-11-29: qty 0.4

## 2022-11-29 MED ORDER — GABAPENTIN 300 MG PO CAPSULE *I*
300.0000 mg | ORAL_CAPSULE | Freq: Every evening | ORAL | Status: DC
Start: 2022-11-29 — End: 2022-11-30
  Administered 2022-11-29: 300 mg via ORAL
  Filled 2022-11-29: qty 1

## 2022-11-29 MED ORDER — GABAPENTIN 100 MG PO CAPSULE *I*
200.0000 mg | ORAL_CAPSULE | Freq: Every day | ORAL | Status: DC
Start: 2022-11-30 — End: 2023-01-28
  Administered 2022-11-30: 200 mg via ORAL
  Filled 2022-11-29: qty 2

## 2022-11-29 MED ORDER — ASPIRIN 325 MG PO TBEC *I*
325.0000 mg | DELAYED_RELEASE_TABLET | Freq: Every day | ORAL | Status: DC
Start: 2022-11-29 — End: 2022-11-30
  Administered 2022-11-29 – 2022-11-30 (×2): 325 mg via ORAL
  Filled 2022-11-29 (×2): qty 1

## 2022-11-29 MED ORDER — BISACODYL 10 MG RE SUPP *I*
10.0000 mg | Freq: Every day | RECTAL | Status: DC | PRN
Start: 2022-11-29 — End: 2022-11-30

## 2022-11-29 MED ORDER — POLYETHYLENE GLYCOL 3350 PO PACK 17 GM *I*
17.0000 g | PACK | Freq: Every day | ORAL | Status: DC
Start: 2022-11-29 — End: 2022-11-30
  Filled 2022-11-29: qty 17

## 2022-11-29 MED ORDER — DEXTROSE 5 % FLUSH FOR PUMPS *I*
0.0000 mL/h | INTRAVENOUS | Status: DC | PRN
Start: 2022-11-29 — End: 2022-11-30

## 2022-11-29 MED ORDER — LABETALOL HCL 20 MG/4 ML IV SOLN WRAPPED *I*
10.0000 mg | INTRAVENOUS | Status: DC | PRN
Start: 2022-11-29 — End: 2022-11-30

## 2022-11-29 MED ORDER — ACETAMINOPHEN 325 MG PO TABS *I*
650.0000 mg | ORAL_TABLET | Freq: Four times a day (QID) | ORAL | Status: DC | PRN
Start: 2022-11-29 — End: 2022-11-30

## 2022-11-29 NOTE — Progress Notes (Signed)
Acute Stroke Navigator Response Note:  Last Known Well:  11/28/2022 11:00 PM   Discovery of Symptoms: 11/29/2022 10:30 AM  Stroke Alert Page Time: 11/29/2022 12:59 PM  ASN Response Time: 11/29/2022 1305      [name] A Hodde is an 72 y.o. female with PMH significant for extensive cancer history, DVT/PE on Eliquis and HLD presenting as a stroke alert called from the ED for significant dizziness and generalized weakness/unwell feeling when waking this morning.   Per later report from her friend Clydie Braun at bedside, she states Clayborne Dana has had mulitple episodes of suddenly feeling unwell/going pale and weak over the last few days. Previous episodes have resolved on their own but symptoms persisted this morning prompting medical attention.     Assessment:  Upon ASN arrival, initial assessment significant for dizziness, slightly decreased sensation in the right face, arm and leg and right beating nystagmus.   NIHSS of 2.     Acute Stroke Treatment:  IV Thrombolytic? No, on Eliquis and LKW last night.     Imaging/Test Completed:  Patients transported on travel monitor to CT/CTA.   Labs: Sent  Orthostatic BPs done: SBP 170s lying, 150s sitting and 140s standing. See doc flow for details.     Lines and Devices:  IV Access: 18 gauge in R AC    Dysphagia Screening Performed?: Yes, passed.     Patient/Family Education  Concern for potential stroke causing dizziness and decreased sensation. Stroke symptoms discussed with patient as well as risk factors such as history of DVT/PE. Discussed that despite symptoms, she is not a candidate for thrombolytic therapy due to taking Eliquis and last known well before bed last night.     Anticipatory Guidance  Likely admit to Neuro Red team for stroke work up.     Belongings:  Belongings at bedside: clothing, purse with smart phone    No further indication for Acute Stroke Navigator at this time.     Report given to [place], RN    Tressie Stalker, RN  11/29/2022 at 2:03 PM

## 2022-11-29 NOTE — Bed Hold Note (Signed)
Bed: AC-24R  Expected date: 11/29/22  Expected time: 4:52 PM  Means of arrival:   Comments:  CCB

## 2022-11-29 NOTE — ED Notes (Signed)
11/29/22 1234   Expected Patient   Date of notification 11/29/22   Time Notified 1234   Notified by Office/PCP   ED Service Adult Call-in   Pt Info note/Reason for sending Per [facility] from Dr Nedra Hai Office, patient awoke with feeling of dizziness, like she was going to pass out. Went to bed feeling normal, now has pain between shoulders and weak in leg. Friend is goin to drive patient to ED per caller.   Patient coming from Home   Expected Call-In Information   Requested Evaluation By Adult ED   Notify Adult ED   Report called to Adult ED, call in note

## 2022-11-29 NOTE — Progress Notes (Signed)
72 yr old woman hx ovarian cancer, dvt/pe on eliquis presents for dizziness visual changes since 1030am this morning. Last seen normal last night.     Verlin Grills, MD  11/29/22 [phone]

## 2022-11-29 NOTE — Progress Notes (Signed)
Report Given    Diagnostic Imaging Nurse Handoff    Study: MRI  Neuro Status:Alert and Oriented x3, can follow commands and is consentable  Telemetry:No, Patient is not currently on any telemetry or monitoring.  IV Access:  Peripheral IV 11/29/22 1300 18 G Right Antecubital (Active)   Phlebitis Scale Grade 0 11/29/22 1330   Infiltration Scale Grade 0 11/29/22 1330   Line Status Flushed 11/29/22 1330   Dressing Type Transparent 11/29/22 1330   Dressing Status Clean 11/29/22 1330     Continuous Infusions:No  Arterial line/transducer present:No.  Additional lines and drains: None  Medication Patches/Insulin Pumps:No  Precautions:None (Universal)  Hospital gown: Yes  Transport:Bed 2 person   Hover Requested:Yes  Respiratory:Room Air  Sleep Apnea:No  Claustrophobic: No  Pain Concerns:No  Sedation/Pain/Medication Plan :NA  Able to Lay Flat for the Duration of the Scan:Yes  Code status: Full Code    Alla Feeling, RN Received Handoff Report from Byrd Hesselbach - RN for MRI 8:52 PM       Procedure Summary

## 2022-11-29 NOTE — ED Triage Notes (Signed)
Went to bed at midnight normal. Woke up at 1030 with dizziness and unsteady gait, generalized weakness and vision changes. On Eliquis.   MD Nicholos Johns called stroke alert in triage. Direct to [facility]B.     Prehospital medications given: No

## 2022-11-29 NOTE — Comprehensive Assessment (Signed)
Adult Social Work Initial Assessment    SW met with the pt and husband at bedside to complete SW comprehensive assessment below. They live together in a ranch style home. Pt is independent with ADL's. Pt does use a walker for longer distances. Pt husband assist with the laundry and does some of the cooking as well.   Perrin Smack, LMSW  Emergency Department Social Worker  [phone]    Demographics:  Demographics  Religious Beliefs: [facility] of Residence: [place]  Marital status: Married  Ethnicity/Race: Caucasian  Primary Language: English  Insurance Information: Advertising copywriter of?: No one    Risk Factors:  Risk Factors  Risk Factors: Age related issues, Adjustment to Dx/Injury/Illness    Advance Directive:  Health Care Directives Screen (Also required for Hospice Patients)  *Has patient (or family) completed any of the following? (select all that apply): None completed    Personal Contacts/Support System:  Contact Information  Spokesperson Name: Hebah Duhart  Relationship to Patient : Spouse  Phone Number(s): [phone]     Living Situation:  Living Situation  Lives With: Spouse  Can they assist patient after discharge?: Yes  Primary Care Taker of?: No one    Home Geography:  Home Geography  Type of Home: Ranch Home  # Of Steps In Home: 0  # Steps to Enter Home: 1  Bedroom: First floor  Bathroom: First floor - full  Utilitites Working: Yes    Psychosocial:  Palliative Care Assessment  Person assessed: Family, Patient  Coping Status: Appropriate to Stressor    Baseline ADL functioning:  Baseline ADL functioning  Transfers: Independent  Ambulation: Independent  Assistive Device: none, Walker  Bathing/Grooming: Independent  Meal Prep: Independent (split with husband)  Able to feed self?: Yes  Household maintenance/chores: With assistance (husband dos the laundry in the basement)  Able to drive?: Yes    Dialysis:  Dialysis  Does Patient have Dialysis?: No    Income Information:  Income  Chief Financial Officer: Retired  Income Situation: Government social research officer Information: Medicare  Prescription Coverage: and has  Served in [place] military: No  Is patient OPWDD connected? : No  Is the patient presumed eligible for OPWDD services?: No    Home Care Services:  Home Care Services  Do you currently have home care services?: No  Current Home Equipment: Cane (straight), Grab bars in shower/tub, Walker (rolling)     Home Oxygen:  SW Home oxygen  Do you have home oxygen?: No     Inpatient consult to Social Work  Consult performed by: Perrin Smack, LMSW  Consult ordered by: Arnetha Massy, DO  Reason for consult: SW assessment          Perrin Smack, LMSW   9:51 PM on 11/29/2022
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
## 2022-11-29 NOTE — Progress Notes (Signed)
SW responded to stroke alert. Providers at bedside at this time. SW contacted pts spouse/emergency contact Mike Gip [phone]) and left a vm. No further SW needs at this time.    Celesta Aver, LMSW  ED Social Worker  [phone] or [phone]

## 2022-11-29 NOTE — Provider Consult (Signed)
Department of Neurosurgery  Stroke Consultation Note           Reason for Consult:  Ischemic stroke On-Call Attending:   Cyndie Chime        History of Present Illness (HPI)      Julie Walsh is a 72 y.o. female with history of Depression, Fibromyalgia, trigeminal neuralgia (prior microvascular decompression on right), Known meningoma near [place], Adenocarcinoma of Oral cavity, Basal cell carcinoma, Ovarian cancer (stage 4-recently spread to intestines), Uterine Cancer, PE, DVT, COPD, GERD, Leukopenia, presenting as an acute ischemic stroke with dizziness. She reports waking up today feeling dizzy and concerned she was going to pass out. She further reports that it was difficult for her to ambulate and she correlated it to "Feeling Drunk". She needed to hang on to things in order to make it from her bed to couch. She called her friend who came over and noted she was not acting right. She called her doctor who advised she go to the ED. She was driven to ED by her friend and seen in triage. Stroke alert then called.     The patient currently takes apixaban twice daily.    Pre-stroke modified rankin scale:  0 - No symptoms    Stroke Time Documentation     Last Known Well Time:  Midnight  Symptom Discovery Time:  1030  Neurosurgery Page Time:  1306  Patient Seen:  1315    Additional History     Patient's past medical, surgical, social, and family history was reviewed.  Patient's allergies were reviewed.  Please see eRecord for full details.    No current facility-administered medications on file prior to encounter.     Current Outpatient Medications on File Prior to Encounter   Medication Sig Dispense Refill    famotidine 20 mg tablet Take 1 tablet (20 mg total) by mouth nightly.      tretinoin (RETIN-A) 0.05 % cream SMARTSIG:Topical Every Night      GABAPENTIN 100 MG capsule SMARTSIG:By Mouth      acetaminophen 650 mg CR tablet Take by mouth      apixaban (ELIQUIS) 5 mg tablet Take 1 tablet (5 mg total)  by mouth every 12 hours.      cyclobenzaprine (FLEXERIL) 5 mg tablet Take by mouth      hydrocortisone (ANUSOL-HC) 2.5 % rectal cream       letrozole (FEMARA) 2.5 mg tablet Take by mouth      Multivitamin tablet Take 1 tablet by mouth daily.      pantoprazole (PROTONIX) 40 mg EC tablet       ALPRAZolam (NIRAVAM) 0.25 MG dissolvable tablet Take 1 tablet (0.25 mg total) by mouth nightly as needed for Anxiety.      mesalamine (LIALDA) 1.2 GM EC tablet Take by mouth daily (with breakfast)  Swallow whole. Do not cut or chew.      nystatin (MYCOSTATIN) 100000 UNIT/GM cream Apply topically 2 times daily  to the following areas:      gabapentin (NEURONTIN) 300 MG capsule Take 1 capsule (300 mg total) by mouth 3 times daily.      traZODone (DESYREL) 100 MG tablet Take 1 tablet (100 mg total) by mouth nightly.      ranitidine (ZANTAC) 300 MG tablet Take 1 tablet (300 mg total) by mouth daily.      Cholecalciferol (VITAMIN D3) 1000 UNIT tablet Take 1 tablet (1,000 units total) by mouth daily.  Physical Examination     General Physical Examination  General appearance:  Alert and appears stated age, anxious  Head:  Normocephalic, atraumatic. +dizziness with downward gaze  Lungs:  Non-labored respirations bilaterally  Heart:  Regular rate and rhythm  Pulses:  2+ and symmetric    Neurological Examination  Mental status:  Awake, verbal, following commands    NIH Stroke Scale     NIHSS Performed:  Date: 11/29/22  Time: 1410  NIH Stroke Scale:  Level of Consciousness (1a): 0 - Alert/Normal  LOC Questions (1b): 0 - Answers both questions correctly  LOC Commands (1c): 0 - Performs both tasks correctly  Best Gaze (2): 0 - Normal  Visual (3): 0 - No visual loss  Facial Palsy (4): 0 - Normal symmetrical movements  Motor Arm, Left (5a): 0 - Normal (extends arm 90 degrees for 10 seconds without drift)  Motor Arm, Right (5b): 0 - Normal (extends arm 90 degrees for 10 seconds without drift)  Motor Leg, Left (6a): 0 - Normal holds leg in  30 degree position for full 5 secs without drift  Motor Leg, Right (6b): 0 - Normal holds leg in 30 degree position for full 5 secs without drift  Limb Ataxia (7): 2 - Present in two limbs  Sensory (8): 0 - Normal, no sensory loss  Best Language (9): 0 - No aphasia,  Normal  Dysarthria (10): 0 - Normal Articulation  Extinction & Inattention (11): 0 - Normal/no neglect/absent  Total: 2    Lab Results     Recent Labs     11/29/22  1313   WBC 5.6   Hematocrit 42   Platelets 211   Sodium 141   Potassium 4.2   UN 14   Creatinine 1.27*   Glucose 117*   Protime 13.3*   INR 1.2*   aPTT 29.2       Imaging Studies     Noncontrast CT head: No acute findings or early ischemic changes. Known Meningioma    CT angio head and neck:  No LVO or significant stenosis. Posterior circulation is small but open.     ASPECTS:  Area Abnormal hypodensity?   caudate No   insular ribbon No   internal capsule No   lentiform nucleus No   M1 - anterior MCA cortex No   M2 - MCA cortex lateral to the insular ribbon No   M3 - posterior MCA cortex No   M4 - anterior MCA superior territory No   M5 - lateral MCA superior territory No   M6 - posterior MCA superior territory No       TOTAL: (10 - #yes) 10          Assessment     72 y.o. female with a history of Depression, Fibromyalgia, trigeminal neuralgia (prior microvascular decompression on right), Known meningoma near [place], Adenocarcinoma of Oral cavity, Basal cell carcinoma, Ovarian cancer (stage 4-recently spread to intestines), Uterine Cancer, PE, DVT, COPD, GERD, Leukopenia, presenting as an acute stroke alert with NIHSS of 2 for BLE ataxia.  Initial CT imaging demonstrates No acute findings or early ischemic changes. Known Meningioma.  CTA with No LVO or significant stenosis. Posterior circulation is small but open. On Eliquis 5 mg BID.  Systemic thrombolytic therapy was not given.      EVT Decision Making     This case was discussed with Neurovascular Chief Resident/Fellow on  11/29/22 at  1415.    EVT pursued at this  hospital?: No        Imaging Reason(s): No evidence of large vessel occlusion (LVO)    Plan     -CTA negative for LVO or significant stenosis    -Her symptoms are concerning for posterior circulation stroke. Although would also rule out other metabolic causes.     -Although Ovarian Cancer rarely metastasizes to the brain- could consider obtaining MRI brain w/wo contrast for stroke and mets.    -Will defer ongoing work up to primary teams.     -No surgical intervention warranted. Please recall for any question/concerns.     Author:  Buelah Manis, NP on 11/29/2022 as of 2:16 PM

## 2022-11-29 NOTE — ED Notes (Signed)
Notified of suspected stroke by: Other: Verlin Grills, MD    Time of notification: [phone]     Transporting agency/means of arrival/time of EMS call: ambulatory     Symptoms: dizziness, visual changes    Time of symptom onset: woke at 1030 today    Last known well: midnight     Stroke alert called @: 1305    Text page @:1306    Author Victorino Dike Ivianna Notch as of 11/29/2022 at 1:07 PM

## 2022-11-29 NOTE — Provider Consult (Signed)
Neurology Stroke Consult/Admission Note     Referring Provider/Service:  ED     Reason for Consult:  Stroke     Stroke Documentation     Last Known Well (LKW):  11/28/2022 11:00 PM   Discovery of Symptoms (DOS):  11/29/2022 10:30 AM   Stroke Team Activated (paged):  11/29/2022 12:59 PM  Stroke Team Arrived:  11/29/2022 12:59 PM    History of Present Illness (HPI)     Julie Walsh is a 72 y.o. female with stage IV ovarian cancer, right Luschka foraminal meningioma, DVT/PE, HLD, HTN, melanoma of the lip, tobacco use disorder, and trigeminal neuralgia who presented 11/29/2022 with dizziness, blurred vision, and generalized weakness.    Julie Walsh reports she went to bed last night in her usual state of health. When she woke up this morning and tried to get up to walk, she felt quite imbalanced and fell back down into bed. She then got up again and felt dizzy and lightheadedness where she felt that she may pass out but also that the room was spinning. She had to furniture-surf to get to the living room as she felt drunk while walking. Her husband is out of town so she called her friend over to help her who also noted that she appeared unsteady while walking. They called her PCP who advised her to go to the ED.    The patient is currently on apixaban 5 mg twice daily. She has been taking this for a few years for DVT/PE. She has also been diagnosed with ovarian cancer with local spread to the colon and underwent chemotherapy and radiation earlier this year. She is currently on letrozole.    Pre-stroke modified rankin scale: 0 - No symptoms.    Additional History     Patient's past medical, surgical, social, and family history was reviewed.  Patient's allergies were reviewed.  Please see eRecord for full details.    No current facility-administered medications on file prior to encounter.     Current Outpatient Medications on File Prior to Encounter   Medication Sig Dispense Refill    famotidine 20 mg tablet Take 1 tablet (20  mg total) by mouth nightly.      tretinoin (RETIN-A) 0.05 % cream SMARTSIG:Topical Every Night      GABAPENTIN 100 MG capsule Take 2 capsules (200 mg total) by mouth daily. Takes 200 mg in the AM and 300 mg in the PM      acetaminophen 650 mg CR tablet Take by mouth      apixaban (ELIQUIS) 5 mg tablet Take 1 tablet (5 mg total) by mouth every 12 hours.      cyclobenzaprine (FLEXERIL) 5 mg tablet Take by mouth      hydrocortisone (ANUSOL-HC) 2.5 % rectal cream       letrozole (FEMARA) 2.5 mg tablet Take by mouth      Multivitamin tablet Take 1 tablet by mouth daily.      pantoprazole (PROTONIX) 40 mg EC tablet       ALPRAZolam (NIRAVAM) 0.25 MG dissolvable tablet Take 1 tablet (0.25 mg total) by mouth nightly as needed for Anxiety.      nystatin (MYCOSTATIN) 100000 UNIT/GM cream Apply topically 2 times daily  to the following areas:      gabapentin (NEURONTIN) 300 MG capsule Take 1 capsule (300 mg total) by mouth nightly. Takes 200 mg in the AM and 300 mg in the PM      traZODone (DESYREL) 100 MG  tablet Take 1 tablet (100 mg total) by mouth nightly.      Cholecalciferol (VITAMIN D3) 1000 UNIT tablet Take 1 tablet (1,000 units total) by mouth daily.      clotrimazole-betamethasone (LOTRISONE) cream Apply topically 2 times daily. For back      ondansetron (ZOFRAN) 4 mg tablet Take 1 tablet (4 mg total) by mouth every 8 hours as needed.      triamcinolone (KENALOG) 0.1 % cream Apply topically daily.       Physical Examination     Temp:  [36 C (96.8 F)] 36 C (96.8 F)  Heart Rate:  [63-72] 71  Resp:  [13-23] 15  BP: (136-177)/(66-88) 150/77    General:  Mild acute distress  Cardiac: Regular rate, no m/r/g  Pulmonary: Normal WOB    Neurological Examination     Mental Status:  Awake and alert; oriented to age and month; fluent; naming, repetition, and comprehension intact; no evidence of neglect or apraxia; normal affect  Cranial Nerves:  II (R/L):  Pupils constrict from 3/3 to 2/2 to light; visual fields were full to  confrontation  III/IV/VI:  Versions were intact with unsustained right beating nystagmus on right gaze; no gaze preference; no ptosis  V:  Facial sensation was reduced to pinprick on the right  VII:  Facial expression was symmetric  VIII:  Hearing was intact to voice  IX/X:  Palate elevated symmetrically; no evidence of dysarthria or dysphonia  XI:  Shoulder shrug appeared symmetric  XII:  Tongue protruded midline  Motor:  Muscle bulk, tone, and strength is normal and symmetric throughout; pronator drift was absent; no evidence of abnormal movements  Sensory:  Intact light touch but impaired pinprick on the right; no evidence of tactile extinction to DSS  Coordination:  Finger to nose was not obviously ataxic but had tremor bilaterally in both arms while heel to shin was ataxic on the right  Reflexes (R/L):  Deferred  Gait:  Truncal ataxia upon sitting and standing up, wide-based unsteady gait, unable to stand steadily with feet together.    NIH Stroke Scale     NIHSS Performed:  Date: 11/29/22  Time: 1400  NIH Stroke Scale:  Interval: At Admission (Initial)  Level of Consciousness (1a): 0 - Alert/Normal  LOC Questions (1b): 0 - Answers both questions correctly  LOC Commands (1c): 0 - Performs both tasks correctly  Best Gaze (2): 0 - Normal  Visual (3): 0 - No visual loss  Facial Palsy (4): 0 - Normal symmetrical movements  Motor Arm, Left (5a): 0 - Normal (extends arm 90 degrees for 10 seconds without drift)  Motor Arm, Right (5b): 0 - Normal (extends arm 90 degrees for 10 seconds without drift)  Motor Leg, Left (6a): 0 - Normal holds leg in 30 degree position for full 5 secs without drift  Motor Leg, Right (6b): 0 - Normal holds leg in 30 degree position for full 5 secs without drift  Limb Ataxia (7): 1 - Present in one limb  Sensory (8): 1 - Mild-to-moderate decrease in sensation due to stroke  Best Language (9): 0 - No aphasia,  Normal  Dysarthria (10): 0 - Normal Articulation  Extinction & Inattention (11): 0 -  Normal/no neglect/absent  Total: 2    Labs/Imaging     I personally reviewed the patient's following laboratory data and radiology studies.    Recent Labs   Lab 11/29/22  1313   WBC 5.6   RBC 4.6  Hemoglobin 14.0   Hematocrit 42   Platelets 211     Recent Labs   Lab 11/29/22  1313   Sodium 141   Potassium 4.2   Chloride 103   CO2 25   UN 14   Creatinine 1.27*   Glucose 117*     Recent Labs   Lab 11/29/22  1313   INR 1.2*   No results for input(s): "LDL", "LDLC", "FLLDL", "HA1C" in the last 168 hours.    Brain and vessel imaging:  CT head:  no early ischemic change, no hemorrhage, and no midline shift.    CTA head:  no large vessel stenosis and no large vessel occlusion  CTA neck:  no cervical large vessel stenosis or occlusion     IV Thrombolytic Decision Making     I discussed acute treatment decisions with attending/fellow Dr. Marrion Coy, MD who personally reviewed the CT head scan on 11/29/2022 at 2:06 PM.    Was IV thrombolytic administered?: No                              Assessment     72 y.o. female with stage IV ovarian cancer, right Luschka foraminal meningioma, DVT/PE, HLD, HTN, melanoma of the lip, tobacco use disorder, and trigeminal neuralgia who presented 11/29/2022 with dizziness, blurred vision, and generalized weakness.    Patient's neurological exam is notable for right face, arm, and leg hypesthesia to pinprick, right-sided ataxia, and ataxic gait with NIHSS of 2.  CT head showed no acute abnormalities.  CTA head and neck showed non-dominant posterior circulation with strong collaterals from the anterior circulation, but patent vasculature.    The patient's clinical presentation is concerning for a brainstem infarct likely involving the pons or pontocerebellar connections. 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 Suspect etiology is  possibly small vessel disease vs branch atheromatous disease involving pontine perforators given unremarkable large vessels.    The patient was not administered IV thrombolytic therapy at this hospital.  The patient did not undergo endovascular therapy.  The patient will be admitted to the neurology floor.     Dysphagia Screen performed? 11/29/2022 at 2:11 PM  Yes, Pass      Plan     - Admit to neurology, Dr. Morrell Riddle, attending  - Vital signs and neuro checks q4h  - Allow for permissive hypertension, goal SBP < 180, PRN labetalol  - Labs:  Lipid panel, HbA1c  - Imaging:  MRI head without contrast  - Cardiac studies:  Echocardiogram without bubble study, telemetry x48 hours  - Antithrombotic:  Aspirin 325 mg daily until MRI is completed, hold home Eliquis until then  - Statin:  Atorvastatin 40 mg daily    Home Meds:  Trigeminal neuralgia: continue GBP 200/300 mg  Ovarian cancer: continue letrozole 2.5 mg daily  GERD: continue pantoprazole 40 mg daily  Insomnia: continue trazodone 100 mg daily    N:  Diet regular  DVT Prophylaxis:  Lovenox 40 mg SQ daily  Bowel Regimen:  Senna, Miralax, and Bisacodyl PR PRN  Med rec: COMPLETED  PT/OT/SLP:  Ordered  Code Status:  Full Code    - If new neurological symptoms occur, obtain STAT CT head without contrast and page stroke neurology resident on-call    Patient seen and examined with stroke attending/fellow, Dr.  Marrion Coy, MD    Author:  Arnetha Massy, DO 11/29/2022 as of 4:33 PM

## 2022-11-29 NOTE — ED Notes (Signed)
Report Given To  Byrd Hesselbach RN       Descriptive Sentence / Reason for Admission   Patient states started with dizziness, difficulty ambulating  ~ 1030       Active Issues / Relevant Events   A/O X4/ lives with husband/ ambulatory / uses walker for long distance only  Full code  DVT- on [facility]        To Do List  Monitor VS/ Neuro's Q 4 hours  PT/OT   MRI- screening completed       Anticipatory Guidance / Discharge Planning  Admit

## 2022-11-30 ENCOUNTER — Other Ambulatory Visit: Payer: Self-pay

## 2022-11-30 LAB — CBC AND DIFFERENTIAL
Baso # K/uL: 0.1 10*3/uL (ref 0.0–0.2)
Eos # K/uL: 0.3 10*3/uL (ref 0.0–0.5)
Hematocrit: 39 % (ref 34–49)
Hemoglobin: 13 g/dL (ref 11.2–16.0)
IMM Granulocytes #: 0 10*3/uL (ref 0.0–0.0)
IMM Granulocytes: 0.4 %
Lymph # K/uL: 2 10*3/uL (ref 1.0–5.0)
MCV: 93 fL (ref 75–100)
Mono # K/uL: 0.5 10*3/uL (ref 0.1–1.0)
Neut # K/uL: 2.5 10*3/uL (ref 1.5–6.5)
Nucl RBC # K/uL: 0 10*3/uL (ref 0.0–0.0)
Nucl RBC %: 0 /100{WBCs} (ref 0.0–0.2)
Platelets: 206 10*3/uL (ref 150–450)
RBC: 4.2 MIL/uL (ref 4.0–5.5)
RDW: 12.7 % (ref 0.0–15.0)
Seg Neut %: 46.9 %
WBC: 5.4 10*3/uL (ref 3.5–11.0)

## 2022-11-30 LAB — BASIC METABOLIC PANEL
Anion Gap: 10 (ref 7–16)
CO2: 25 mmol/L (ref 20–28)
Calcium: 9.2 mg/dL (ref 8.6–10.2)
Chloride: 105 mmol/L (ref 96–108)
Creatinine: 1.24 mg/dL — ABNORMAL HIGH (ref 0.51–0.95)
Glucose: 110 mg/dL — ABNORMAL HIGH (ref 60–99)
Lab: 14 mg/dL (ref 6–20)
Potassium: 3.6 mmol/L (ref 3.3–5.1)
Sodium: 140 mmol/L (ref 133–145)
eGFR BY CREAT: 46 * — AB

## 2022-11-30 LAB — HEMOGLOBIN A1C: Hemoglobin A1C: 5.6 %

## 2022-11-30 LAB — AEROBIC CULTURE: Aerobic Culture: 0

## 2022-11-30 MED ORDER — GENERIC DME *A*
0 refills | Status: AC
Start: 2022-11-30 — End: ?

## 2022-11-30 MED ORDER — APIXABAN 5 MG PO TABS *I*
5.0000 mg | ORAL_TABLET | Freq: Two times a day (BID) | ORAL | Status: DC
Start: 2022-11-30 — End: 2022-11-30
  Administered 2022-11-30: 5 mg via ORAL
  Filled 2022-11-30: qty 1

## 2022-11-30 MED ORDER — ATORVASTATIN CALCIUM 40 MG PO TABS *I*
40.0000 mg | ORAL_TABLET | Freq: Every day | ORAL | 0 refills | Status: AC
Start: 2022-11-30 — End: 2023-11-30
  Filled 2022-11-30: qty 90, 90d supply, fill #0

## 2022-11-30 MED ORDER — APIXABAN 5 MG PO TABS *I*
5.0000 mg | ORAL_TABLET | Freq: Two times a day (BID) | ORAL | Status: DC
Start: 2022-11-30 — End: 2022-11-30
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
## 2022-11-30 NOTE — Plan of Care (Signed)
Problem: Safety  Goal: Patient will remain free of falls  Outcome: Maintaining     Problem: Pain/Comfort  Goal: Patient's pain or discomfort is manageable  Outcome: Maintaining     Problem: Mobility  Goal: Patient's functional status is maintained or improved  Outcome: Maintaining

## 2022-11-30 NOTE — Continuity of Care (Signed)
I received a call informing me that Dr. Nedra Hai had left the practice and that a call needed to be made to her new practice.    I call [phone] and informed that receptionist of the need to set up an appointment.  She stated that she had not opening so I suggested she call the patient  when she does.    I alerted the patient and her husband to disregard the appointment on the printed AVS and that the office will call.        They verbalized understanding.    Ronnell Freshwater MSN  Acute Care Coordinator  [phone]

## 2022-11-30 NOTE — Progress Notes (Signed)
Per CRN Latanya Presser, it is okay for this writer to administer Letrozole. Confirmed with covering provider DR Verlin Fester who agrees for writer to administer Letrozole. Chief Strategy Officer made aware

## 2022-11-30 NOTE — Discharge Instructions (Addendum)
Brief Summary of Your Hospitalization  You came to the hospital with complaints of dizziness, blurred vision and general weakness.  You had a stroke work-up that did not show a stroke, but given your ongoing symptoms and the fact that you are slowly improving, we are still concerned you had a small stroke that may not be visible on MRI.      We checked your cholesterol level.  Your bad cholesterol, the LDL cholesterol, was 107.  We want this to be less than 70 and you were started on atorvastatin to help lower your cholesterol.  We also checked your hemoglobin A1c, a blood test that is elevated if you have diabetes.  This was 5.6, and we want this to be less than 7.0.     You were seen and evaluated by our therapy services who felt that you were safe to go home with a rolling walker and shower chair, with outpatient physical therapy.    During our imaging, we found an outpouching of one of your blood vessels, also called an aneurysm.  We contacted our neurosurgery colleagues, who said there was nothing to do in the hospital for this problem.  This finding was unrelated to your current symptoms. We have provided a neurosurgery referral to follow up in clinic for this concern.     Your diagnosis is: MRI negative stroke     Your medication changes include:  Start atorvastatin 40 mg (1 tablet) daily    Recommended diet:  Regular diet    Recommended activity:  activity as tolerated    Recommended further testing:  None    Your instructions:  Take all of your medications as prescribed.  Please sign up for MyChart if you have not already done so.  See instructions below.  This is a great way to communicate with your [facility] health care team.   If you have any questions/concerns or worsening of your stroke symptoms within the first 24 hours after discharge, please notify the discharge attending Dr. Soyla Dryer at phone-number: [phone]  If you have general medical questions/concerns 24 hours or more after discharge, please  follow up with your primary care provider.  Follow up with your primary care provider within a week of discharge.  Take your discharge paperwork with you to your appointment.   Schedule an appointment with Neurosurgery to follow up regarding aneurysm that was found on imaging.  Follow up in the stroke clinic.  This appointment has been made for you and is listed below.  The stroke clinic is located in [facility], the address is [address]    -Call 911 with any new stroke symptoms which include sudden onset weakness or numbness on one side of your body, facial droop, slurred speech, inability to speak or speech that doesn't make sense, loss of vision, double vision.    Education:  Your stroke risk factors are: high blood pressure, high cholesterol, DVT/PE, smoking, ovarian cancer. You were given education on your risk factors for stroke, prescribed medications, and follow-up prior to your discharge. Please ask your nurse or doctor for a stroke education booklet if you have not yet received one.
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
## 2022-11-30 NOTE — Interdisciplinary Rounds (Signed)
Interdisciplinary Discharge Communication Note         Targeted Discharge Disposition : Home    Rounding was performed on: IDR Date: 11/30/22 at: IDR Time: 0830    Interdisciplinary Rounding Participants: APP, SW, Care Coordinator, Charge RN, Engineer, drilling, OT, Resident    Admit Date/Time:  11/29/2022  1:06 PM     Principal Problem:  vertigo  The patient's problem list and interdisciplinary care plan was reviewed.    EDD:11/30/2022        Discharge Planning    Targeted Disposition   Targeted Discharge Disposition : Home    Home Care  Home Care, Choiced?: N/A           Medical Equipment Needs  Current Home Equipment: Cane (straight), Grab bars in shower/tub, Environmental consultant (rollator)  Respiratory Home Equipment : Not Applicable   Medical Supplies Available: Not Applicable (family will obtain  shower chair from Water quality scientist)  Equipment/Supplies Ordered: None          Delivery of D/C DME/medical supplies confirmed, if applicable: Not Applicable    Discharge Planning Comments: discharge planning includes home  with MD follow up    Communication  Is communication necessary with patient spokesperson? : No  Why communication is not necessary: Patient has capacity     Who is communicating with patient spokesperson?: Care Coordinator    Current PCP:  Jetty Duhamel, MD    Scheduled Future Appointments:  Follow-up Appointments: Follow-up appointment with PCP made     Future Appointments      Wednesday December 20, 2022 11:30 AM  (Arrive by 11:15 AM)  FOLLOW UP VISIT with Liliane Bade, MD  [facility] [phone]--[phone]              Transportation  Transportation Setup:  (family)       SW Financial/Voucher Assistance for Transportation: N/A    Prescriptions   Prescriptions sent to outpatient pharmacy: Pending         Ronnell Freshwater, RN  1:57 PM

## 2022-11-30 NOTE — Discharge Summary (Signed)
Name: Julie Walsh MRN: [id number] DOB: 1950/06/25     Admit Date: 11/29/2022       Patient was accepted for discharge to home    Hospitalization Summary     Julie Walsh is a 72 y.o. female with history of stage IV ovarian cancer, right Luschka foraminal meningioma, DVT/PE, HLD, HTN, melanoma of the lip, tobacco use disorder, and trigeminal neuralgia who presented 11/29/2022 with dizziness, blurred vision, and generalized weakness.     Initial neurologic exam showed a NIHSS of 2 for sensory and ataxia. She did not receive an IV thrombolytic. She did not undergo embolectomy.    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 No LVO. No significant stenosis of the anterior circulation.  MRI brain: mild chronic microvascular disease  TTE: deferred  Telemetry: NSR  LDL: 107 mg/dL  A5W: 5.6 %    Hospital course was otherwise unremarkable. The patient was discharged on apixaban 5 mg twice daily (on eliquis prior to admission) and atorvastatin 40 mg daily (on nothing prior to admission) for secondary stroke prevention. Neurosurgery was consulted regarding the L V2 aneurysm, and no acute surgical/diagnostic intervention was indicated. Physical, occupational, and speech therapy recommended home therapy.  The patient was tolerating a regular diet with thin liquids. She was discharged to home.    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 Oriented to person, place, and time. Fluent speech with no dysarthria.  Reading, repetition, and comprehension intact.  No evidence of an expressive or receptive aphasia. Affect  appropriate.  Cranial Nerves:       II: Reads NIHSS cards, discs sharp, pupils 3/3 to 2/2, fields intact to confrontation.     III/IV/VI: The HINTS exam was notable for right-beating, unidirectional nystagmus. There was a slight correction in the nystagmus with head movement. Skew deviation testing was negative.    V: Facial sensation symmetric to light touch    VII: Facial expression symmetric    VIII: Hearing intact to voice    IX/X: Palate elevates symmetrically    XI: Shoulder shrug symmetric    XII: Tongue midline  Motor: Bulk, tone, and strength were normal throughout. Pronator drift was absent. There were no abnormal movements.  Sensory: Sensation to light touch, temperature, vibration, pinprick, and proprioception were intact.    Coordination: Finger to nose were intact but had tremor bilaterally in both arms.   Reflexes: deferred  Gait: truncal ataxia upon standing up    Modified Rankin Score at discharge: 1 - No significant disability despite symptoms; able to carry out all usual activities    Signed: Julian Hy, MD  On: 12/01/2022  at: 5:53 PM
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
## 2022-12-04 ENCOUNTER — Telehealth: Payer: Self-pay

## 2022-12-04 NOTE — Telephone Encounter (Signed)
Spoke with Clayborne Dana regarding post discharge follow up for recent stroke admission to [facility]. The following was discussed:  Do you have any questions about your discharge instructions?  No    Confirm contact information for [facility] enrollment? Yes    Were you able to fill all your prescriptions?  Yes    Do you have questions about your medications?  No    Do you know your follow-up appointments?  Yes    Do you have a plan to get to your follow-up appointments?  Yes    If home services ordered, have you had a visit?  N/A    If equipment ordered, did it arrive?  N/A    Do you know who to call with worsening symptoms?  Yes. Stroke Clinic number provided [phone]). Education provided on stroke signs and symptoms and the importance of activating EMS.    It is important to [place] that your transition from hospital to home is successful.  Do you have additional questions I can assist you with?  No    The following issues were identified and addressed:  None

## 2022-12-08 LAB — EKG 12-LEAD
P: 57 deg
PR: 156 ms
QRS: -6 deg
QRSD: 79 ms
QT: 441 ms
QTc: 477 ms
Rate: 70 {beats}/min
T: -5 deg

## 2022-12-20 ENCOUNTER — Other Ambulatory Visit: Payer: Self-pay

## 2022-12-20 ENCOUNTER — Ambulatory Visit: Payer: Medicare Other | Attending: Neurology | Admitting: Neurology

## 2022-12-20 VITALS — BP 132/71 | HR 71 | Temp 97.4°F | Wt 150.0 lb

## 2022-12-20 DIAGNOSIS — H532 Diplopia: Secondary | ICD-10-CM | POA: Insufficient documentation

## 2022-12-20 DIAGNOSIS — I6930 Unspecified sequelae of cerebral infarction: Secondary | ICD-10-CM | POA: Insufficient documentation

## 2022-12-20 NOTE — Progress Notes (Signed)
[facility]  Clinic Note    Julie Walsh  [id number]  May 17, 1950    Patient seen in stroke clinic follow-up for her recent admission for dizziness and ataxia concerning for possible posterior circulation stroke.  She was accompanied by her husband and daughter-in-law.    Symptom onset and hospital course were reviewed.  She presented to [facility] late last month with the acute onset of dizziness and gait ataxia.  She states that she had difficulty supporting herself when she tried to get out of bed and had difficulty walking to her kitchen.  She endorsed sensations of feeling like she might blackout but also described intense motion sensitivity, which did not result in vertigo per se but more an intense sensation of nausea.  She was found to have some nystagmus on evaluation in the emergency department.  MRI brain without contrast was negative for any signs of acute ischemic stroke, though her symptoms were felt to possibly represent a small MRI negative brainstem stroke given the acute onset.  She was continued on her Eliquis and was started on atorvastatin 40 mg daily.    Since then she has been fairly stable from a neurologic perspective.  There have been no new episodes of acute focal neurologic dysfunction to suggest recurrent stroke or TIA.  She feels that some of her symptoms, specifically her sensation of balance dysfunction, has improved though does remain far from baseline.  More worrisome to her is that she is beginning to notice more specific problems on her right side.  She feels that her right leg has not been improving and may be slightly weaker.  She has also been noticing some binocular horizontal double vision, particularly when looking to her right.  Her other major concern is sharp, shooting pain with a neuropathic quality in the back of her head radiating up of the back of her scalp, similar in nature to prior symptoms of trigeminal neuralgia (albeit in a much different  location).    In terms of her current risk factor control, she continues on her Eliquis without any significant bruising or bleeding issues.  She also reports good compliance with her Lipitor.  She states that home blood pressures are generally in the 110-120 range, not on any form of antihypertensive therapy.  She has a remote history of smoking, having quit about 15 years ago.    Problem list, medications, allergies, and social hx were reviewed and updated in the EMR.     Vitals:    12/20/22 1123   BP: 132/71   Pulse: 71   Temp: 36.3 C (97.4 F)   Weight: 68 kg (150 lb)       Physical Examination:  General appearance: Well appearing woman in NAD.  Mental status: Awake, alert, normal attention.  Language and speech both normal.  Memory not formally tested but appears grossly good.  Affect normal.  No signs of neglect.  Cranial nerves: Eyes are conjugate at primary position.  Leftward gaze appears normal.  When looking to the right, she appears to have some incomplete abduction of the right eye, and has some intermittent horizontal double vision when looking in that direction.  This persist when she looks up to the right or down to the right.  VF full.  Facial strength and sensation both normal.  Hearing appears good.  Tongue midline.  Motor: Full power in all four limbs.  No pronator drift or abnormal movements.  Sensory: Mildly decreased to light touch  in the right arm.  Otherwise intact to light touch throughout.  Romberg test markedly positive, even when eyes open.  Cerebellar: Mild finger-to-nose ataxia, more notable on the right.  Reflexes: 1+ in the arms and at the knees bilaterally.  Gait: Wide-based.  Short stride length.  Possible tendency to list to her right.    Recent brain and vascular imaging were personally reviewed.    Impression:  At this point, the exact nature of this patient's symptoms is unclear.  The acute onset of her symptoms from several weeks ago certainly could support stroke, and some  of her symptoms, such as her balance dysfunction, do seem to be subjectively improving.  However, I am concerned about the reported new onset and progressive worsening of her binocular diplopia, which was not reported at the time of her hospitalization.  This new and worsening symptoms is not follow the typical pattern of stroke.  We also reviewed that the presumptive diagnosis of stroke was unable to be confirmed during the hospital, since her MRI did not show any findings of ischemia.  Furthermore, her stroke was attributed to small vessel occlusive disease though she does not carry typical risk factors for this other than a remote history of smoking.  This opens up the possibility of alternative explanations, including some form of CNS metastatic disease, especially since her MRI from several weeks ago was a noncontrasted study.    - Continue Eliquis for secondary stroke prevention.  - Blood pressure is under fair control at today's visit and her home readings are in a much more acceptable range.  Would continue to closely monitor and consider instituting an antihypertensive regimen as needed to maintain a goal BP ideally close to 120/80.  - Continue lipid-lowering therapy with high potency statin.  - Healthy lifestyle habits (diet, regular exercise) were reviewed.  - Repeat MRI brain, with and without contrast and with thin cuts through the brainstem and posterior fossa, was ordered at today's visit.  - Referral to physical therapy was also placed at today's visit.  - Follow up here TBD based on timing and results of MRI.    Total day of the encounter time 55 minutes, including review of records prior to the visit, direct patient care, and medical documentation.    [name], MD

## 2023-01-04 ENCOUNTER — Other Ambulatory Visit: Payer: Self-pay

## 2023-01-04 ENCOUNTER — Ambulatory Visit
Admission: RE | Admit: 2023-01-04 | Discharge: 2023-01-04 | Disposition: A | Discharge status: Home / Self Care | Payer: Medicare Other | Source: Ambulatory Visit | Attending: Neurology | Admitting: Neurology

## 2023-01-04 DIAGNOSIS — H532 Diplopia: Secondary | ICD-10-CM | POA: Insufficient documentation

## 2023-01-04 DIAGNOSIS — R270 Ataxia, unspecified: Secondary | ICD-10-CM | POA: Insufficient documentation

## 2023-01-04 DIAGNOSIS — R531 Weakness: Secondary | ICD-10-CM | POA: Insufficient documentation

## 2023-01-04 MED ORDER — GADOPICLENOL 0.5 MMOL/ML (VUEWAY) IV SOLN *I*
7.0000 mL | Freq: Once | INTRAVENOUS | Status: AC
Start: 2023-01-04 — End: 2023-01-04
  Administered 2023-01-04: 7 mL via INTRAVENOUS
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
## 2023-01-10 ENCOUNTER — Telehealth: Payer: Self-pay

## 2023-01-10 ENCOUNTER — Other Ambulatory Visit: Payer: Self-pay

## 2023-01-10 NOTE — Telephone Encounter (Signed)
SWP, let her know Dr. Katrinka Blazing will not be in the office tomorrow and that she will be seeing an APP instead. Patient was hesitant, I offered to reschedule her to see Dr. Katrinka Blazing another day and she said this appointment tomorrow will be fine.

## 2023-01-11 ENCOUNTER — Other Ambulatory Visit: Payer: Self-pay

## 2023-01-11 ENCOUNTER — Ambulatory Visit: Payer: Medicare Other | Attending: Obstetrics and Gynecology | Admitting: Obstetrics and Gynecology

## 2023-01-11 ENCOUNTER — Other Ambulatory Visit
Admission: RE | Admit: 2023-01-11 | Discharge: 2023-01-11 | Disposition: A | Discharge status: Home / Self Care | Payer: Medicare Other | Source: Ambulatory Visit

## 2023-01-11 ENCOUNTER — Encounter: Payer: Self-pay | Admitting: Obstetrics and Gynecology

## 2023-01-11 VITALS — BP 100/70 | HR 90 | Temp 96.4°F | Resp 20 | Ht 60.0 in | Wt 147.0 lb

## 2023-01-11 DIAGNOSIS — C569 Malignant neoplasm of unspecified ovary: Secondary | ICD-10-CM | POA: Insufficient documentation

## 2023-01-11 DIAGNOSIS — D3910 Neoplasm of uncertain behavior of unspecified ovary: Secondary | ICD-10-CM | POA: Insufficient documentation

## 2023-01-11 DIAGNOSIS — Z8543 Personal history of malignant neoplasm of ovary: Secondary | ICD-10-CM | POA: Insufficient documentation

## 2023-01-11 LAB — MULTIPLE ORDERING DOCS

## 2023-01-11 LAB — CA 125: CA 125(eff 4-2010): 16 U/mL (ref 0–34)

## 2023-01-11 NOTE — Addendum Note (Signed)
Addended by: Jaimya Feliciano, [place] on: 01/11/2023 02:59 PM     Modules accepted: Level of Service

## 2023-01-14 LAB — INHIBIN B: Inhibin B: 62 pg/mL — ABNORMAL HIGH (ref <=11)

## 2023-01-15 ENCOUNTER — Encounter: Payer: Self-pay | Admitting: Obstetrics & Gynecology
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
## 2023-01-16 ENCOUNTER — Other Ambulatory Visit: Payer: Self-pay

## 2023-01-16 ENCOUNTER — Ambulatory Visit
Payer: Medicare Other | Attending: Physical Medicine and Rehabilitation | Admitting: Physical Medicine and Rehabilitation

## 2023-01-16 VITALS — BP 136/61 | HR 76 | Temp 96.8°F | Wt 149.0 lb

## 2023-01-16 DIAGNOSIS — I7774 Dissection of vertebral artery: Secondary | ICD-10-CM | POA: Insufficient documentation

## 2023-02-28 DIAGNOSIS — Z7901 Long term (current) use of anticoagulants: Secondary | ICD-10-CM | POA: Insufficient documentation

## 2023-02-28 DIAGNOSIS — H903 Sensorineural hearing loss, bilateral: Secondary | ICD-10-CM | POA: Insufficient documentation

## 2023-02-28 DIAGNOSIS — F5104 Psychophysiologic insomnia: Secondary | ICD-10-CM | POA: Insufficient documentation

## 2023-03-04 ENCOUNTER — Other Ambulatory Visit: Payer: Self-pay | Admitting: Gastroenterology

## 2023-03-16 ENCOUNTER — Telehealth: Payer: Self-pay
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
## 2023-03-16 NOTE — Telephone Encounter (Signed)
PT REQUEST APPT DATE CHANGE DUE TO VACATION

## 2023-03-21 ENCOUNTER — Other Ambulatory Visit: Payer: Self-pay

## 2023-03-22 ENCOUNTER — Other Ambulatory Visit: Payer: Self-pay

## 2023-03-22 ENCOUNTER — Ambulatory Visit: Payer: Medicare Other | Attending: Obstetrics & Gynecology | Admitting: Obstetrics & Gynecology

## 2023-03-22 VITALS — BP 108/70 | HR 85 | Temp 97.1°F | Resp 20 | Ht 60.0 in | Wt 150.0 lb

## 2023-03-22 DIAGNOSIS — D3910 Neoplasm of uncertain behavior of unspecified ovary: Secondary | ICD-10-CM | POA: Insufficient documentation

## 2023-03-23 ENCOUNTER — Telehealth: Payer: Self-pay

## 2023-03-23 NOTE — Telephone Encounter (Signed)
 Pt will not be home from [facility] until 05/03/2023     Pt is alright with writer rescheduling to a date after 4/3 and sending a Clinical cytogeneticist message with the new date and time

## 2023-03-23 NOTE — Telephone Encounter (Signed)
 Notification: spoke with patient      Image type & Location: CT @ [facility]    Date & Time: 05/01/2023 @ 1 pm      Prep: NPO 2      Follow up Date & Provider: 06/28/23 Katrinka Blazing 1:30 pm (or sooner pending results)

## 2023-04-09 ENCOUNTER — Ambulatory Visit: Payer: Medicare Other | Admitting: Radiation Oncology

## 2023-05-01 ENCOUNTER — Other Ambulatory Visit: Payer: Medicare Other

## 2023-05-03 ENCOUNTER — Other Ambulatory Visit
Admission: RE | Admit: 2023-05-03 | Discharge: 2023-05-03 | Disposition: A | Discharge status: Home / Self Care | Source: Ambulatory Visit

## 2023-05-03 ENCOUNTER — Other Ambulatory Visit: Payer: Self-pay | Admitting: Gastroenterology

## 2023-05-03 DIAGNOSIS — R69 Illness, unspecified: Secondary | ICD-10-CM | POA: Insufficient documentation

## 2023-05-03 DIAGNOSIS — Z8543 Personal history of malignant neoplasm of ovary: Secondary | ICD-10-CM | POA: Insufficient documentation

## 2023-05-03 LAB — CBC AND DIFFERENTIAL
Baso # K/uL: 0.1 10*3/uL (ref 0.0–0.2)
Eos # K/uL: 0.2 10*3/uL (ref 0.0–0.5)
Hematocrit: 42 % (ref 34–49)
Hemoglobin: 13.3 g/dL (ref 11.2–16.0)
IMM Granulocytes #: 0 10*3/uL (ref 0.0–0.0)
IMM Granulocytes: 0.1 %
Lymph # K/uL: 1.7 10*3/uL (ref 1.0–5.0)
MCV: 96 fL (ref 75–100)
Mono # K/uL: 0.5 10*3/uL (ref 0.1–1.0)
Neut # K/uL: 4.3 10*3/uL (ref 1.5–6.5)
Nucl RBC # K/uL: 0 10*3/uL (ref 0.0–0.0)
Nucl RBC %: 0 /100{WBCs} (ref 0.0–0.2)
Platelets: 213 10*3/uL (ref 150–450)
RBC: 4.4 MIL/uL (ref 4.0–5.5)
RDW: 13.2 % (ref 0.0–15.0)
Seg Neut %: 63.8 %
WBC: 6.8 10*3/uL (ref 3.5–11.0)

## 2023-05-03 LAB — CA 125: CA 125(eff 4-2010): 16 U/mL (ref 0–34)

## 2023-05-03 LAB — COMPREHENSIVE METABOLIC PANEL
ALT: 22 U/L (ref 0–35)
AST: 24 U/L (ref 0–35)
Albumin: 3.9 g/dL (ref 3.5–5.2)
Alk Phos: 77 U/L (ref 35–105)
Anion Gap: 13 (ref 7–16)
Bilirubin,Total: 0.5 mg/dL (ref 0.0–1.2)
CO2: 24 mmol/L (ref 20–28)
Calcium: 9.5 mg/dL (ref 8.6–10.2)
Chloride: 105 mmol/L (ref 96–108)
Creatinine: 1.21 mg/dL — ABNORMAL HIGH (ref 0.51–0.95)
Glucose: 125 mg/dL — ABNORMAL HIGH (ref 60–99)
Lab: 14 mg/dL (ref 6–20)
Potassium: 4.2 mmol/L (ref 3.3–5.1)
Sodium: 142 mmol/L (ref 133–145)
Total Protein: 6.7 g/dL (ref 6.3–7.7)
eGFR BY CREAT: 47 * — AB

## 2023-05-03 LAB — UNMAPPED LAB RESULTS
Hematocrit (HT): 40 % (ref 34–47)
Hemoglobin (HGB) (HT): 13.2 g/dL (ref 11.5–16.0)
MCHC (HT): 32.8 g/dL (ref 32.0–36.0)
MCV (HT): 94.6 fL (ref 81.0–99.0)
Mean Corpuscular Hemoglobin (MCH) (HT): 31 pg (ref 26.0–34.0)
Platelets (HT): 208 10 3/uL (ref 150–450)
RBC (HT): 4.26 10 6/uL (ref 3.80–5.20)
RDW (HT): 12.7 % (ref 11.5–15.0)
WBC (HT): 6.5 10 3/uL (ref 4.0–10.8)

## 2023-05-03 LAB — FOLATE: Folate: >14 ng/mL (ref >=4.6)

## 2023-05-03 LAB — MAGNESIUM: Magnesium: 2.1 mg/dL (ref 1.6–2.5)

## 2023-05-03 LAB — VITAMIN B12: Vitamin B12: 524 pg/mL (ref 232–1245)

## 2023-05-04 LAB — HEMOGLOBIN A1C: Hemoglobin A1C: 5.7 % — ABNORMAL HIGH

## 2023-05-07 ENCOUNTER — Other Ambulatory Visit: Payer: Self-pay

## 2023-05-07 ENCOUNTER — Ambulatory Visit
Admission: RE | Admit: 2023-05-07 | Discharge: 2023-05-07 | Disposition: A | Discharge status: Home / Self Care | Payer: Medicare Other | Source: Ambulatory Visit | Attending: Obstetrics & Gynecology | Admitting: Obstetrics & Gynecology

## 2023-05-07 DIAGNOSIS — K769 Liver disease, unspecified: Secondary | ICD-10-CM | POA: Insufficient documentation

## 2023-05-07 DIAGNOSIS — R918 Other nonspecific abnormal finding of lung field: Secondary | ICD-10-CM | POA: Insufficient documentation

## 2023-05-07 DIAGNOSIS — J9811 Atelectasis: Secondary | ICD-10-CM | POA: Insufficient documentation

## 2023-05-07 DIAGNOSIS — D3910 Neoplasm of uncertain behavior of unspecified ovary: Secondary | ICD-10-CM

## 2023-05-07 MED ORDER — BARIUM SULFATE (REDI-CAT) 2 % PO SUSP *I*
900.0000 mL | Freq: Once | ORAL | Status: AC
Start: 2023-05-07 — End: 2023-05-07
  Administered 2023-05-07: 900 mL via ORAL

## 2023-05-07 MED ORDER — IOHEXOL 350 MG/ML (OMNIPAQUE) IV SOLN *I*
50.0000 mL | Freq: Once | INTRAVENOUS | Status: AC
Start: 2023-05-07 — End: 2023-05-07
  Administered 2023-05-07: 100 mL via INTRAVENOUS

## 2023-05-08 ENCOUNTER — Ambulatory Visit: Payer: Self-pay | Admitting: Obstetrics & Gynecology

## 2023-05-08 ENCOUNTER — Telehealth: Payer: Self-pay | Admitting: Obstetrics & Gynecology

## 2023-05-08 ENCOUNTER — Ambulatory Visit: Payer: Medicare Other | Admitting: Radiation Oncology

## 2023-05-08 ENCOUNTER — Encounter: Payer: Self-pay | Admitting: Obstetrics & Gynecology

## 2023-05-08 ENCOUNTER — Encounter: Payer: Self-pay | Admitting: Radiation Oncology

## 2023-05-08 LAB — INHIBIN B: Inhibin B: 76 pg/mL — ABNORMAL HIGH (ref <=11)

## 2023-05-08 NOTE — Telephone Encounter (Signed)
 Returned call to [place], let her know that Dr. Katrinka Blazing could see her on 05/25/2023 at 8:30 am.   Julie Walsh is all set.

## 2023-05-08 NOTE — Telephone Encounter (Signed)
 Pt is calling stating she had appt with Dr Luther Redo and they suggested she reach out to see if her appt on 5/29 can be moved up sooner, due to her CT results. Verified call back number

## 2023-05-09 ENCOUNTER — Other Ambulatory Visit: Payer: Self-pay

## 2023-05-09 DIAGNOSIS — D3910 Neoplasm of uncertain behavior of unspecified ovary: Secondary | ICD-10-CM

## 2023-05-12 ENCOUNTER — Other Ambulatory Visit: Payer: Self-pay | Admitting: Gastroenterology

## 2023-05-12 DIAGNOSIS — N289 Disorder of kidney and ureter, unspecified: Secondary | ICD-10-CM | POA: Insufficient documentation

## 2023-05-14 ENCOUNTER — Other Ambulatory Visit
Admission: RE | Admit: 2023-05-14 | Discharge: 2023-05-14 | Disposition: A | Discharge status: Home / Self Care | Source: Ambulatory Visit

## 2023-05-14 ENCOUNTER — Encounter: Payer: Self-pay | Admitting: Colon and Rectal Surgery

## 2023-05-14 DIAGNOSIS — N289 Disorder of kidney and ureter, unspecified: Secondary | ICD-10-CM | POA: Insufficient documentation

## 2023-05-14 LAB — URINALYSIS REFLEX TO CULTURE
Blood,UA: NEGATIVE
Glucose,UA: NEGATIVE
Ketones, UA: NEGATIVE
Leuk Esterase,UA: NEGATIVE
Nitrite,UA: NEGATIVE
Protein,UA: NEGATIVE
Specific Gravity,UA: 1.016 (ref 1.002–1.030)
pH,UA: 6.5 (ref 5.0–8.0)

## 2023-05-14 NOTE — H&P (Signed)
 [facility]  Julie Walsh, M.D.     Julie Walsh, M.D.     Julie Walsh, M.D.       Julie Walsh, M.D.     Julie Walsh, M.D.       Julie Walsh, M.D.     Julie Walsh, M.D.     Julie Walsh, M.D.           Julie KellyACCT#: [id number]  DOB: 1952/10/02Sex: female  Date of Visit: 04/14/25Age: 72 years           PCP: Jetty Duhamel, MD  CARE TEAM: Ashlee L. Smith DO  Jetty Duhamel MD     COLORECTAL SURGERY CONSULT    CC:  abdominal mass  HPI: [name] is a 73 yo F with abdominal mass.  She has history of ovarian cancer.  She has new mass - it causes some pain with eating.  Last colonoscopy was within a few years.    PMH:  Health Maintenance:  Colonoscopy - (2023)  Patient States Done. Done by Dr. Ubaldo Glassing   Medical Problems:  Cervical Cancer, Melanoma, Ovarian Cancer  Surgical Hx:  Oophorectomy, Ileostomy, Exploratory Laparotomy  Reviewed and updated.  FH:  Paternal Uncle 1:  Melanoma - (age 22 Years).  Maternal Aunt 1:  Stomach Cancer.  Reviewed and updated.  SH:  Personal Habits:  Tobacco Use: Patient is a former smoker - Quit 2010.Alcohol: Denies alcohol use.Drug Use: Denies Drug Use.  Reviewed and updated.Date: 05/14/2023  Was the patient queried about smoking behavior? Yes  No  Does the patient currently smoke?     Tobacco Use: Patient is a former smoker - Quit 2010.E-Cigarette Use: Negative For Vaping.      ROS:  Const: Denies constitutional symptoms.  Eyes: Denies symptoms other than stated above.  CV: Denies symptoms other than stated above.  Resp: Denies symptoms other than stated above.  GI: Denies symptoms other than stated above.  GU: Denies urinary symptoms other than stated above.  Musculo: Denies symptoms other than stated above.  Neuro: Denies symptoms other than stated above.  Psych: Denies symptoms other than stated above.  Hema/Lymph: Denies hematologic symptoms other than stated above.    Meds Prior to Visit:  [employer] 8HR Arthritis Pain Relief  650 mg  1 x  daily  Eliquis  5 mg   Famotidine  20 mg  take 1 tablet by mouth everyday at bedtime  Gabapentin  300 mg  take 1 capsule by mouth everyday at bedtime  Gabapentin  100 mg   Letrozole  2.5 mg   Pantoprazole Sodium  40 mg   Trazodone HCL  100 mg  take 1 tablet by mouth at bedtime for insomnia  Triamcinolone Acetonide  0.1 %      Allergies:  NKDA, No Allergy To Latex, No Food Allergies    BP: 122/80 Pulse: 78 T: 98.9 Resp: 16 Ht: 60" 5'0" Wt: 150lb Wt kg: 68.040 BMI: 29.3 Pain Level: 0    Exam was done with a nurse and/or acceptable chaperone present.  Exam:  Const: Appears healthy and well developed. No signs of acute distress present. Speech is clear and appropriate.  Eyes: Conjunctivae clear. Sclerae are anicteric.  ENMT: External ears WNL. External nose WNL.  Neck: No asymmetry. Trachea midline.  Resp: Respirations are regular. Inspection of chest reveals airway is patent and airway is clear. Lungs are clear bilaterally.  CV: Rate is regular. Rhythm is regular.  Abdomen: The abdomen is  nondistended. Abdominal scar: midline laparotomy scar and Pfannenstiel. Bowel sounds are normoactive. Normal to percussion. Abdomen is nontender. No abdominal masses palpable. Abdominal wall is soft. No palpable hernias. No palpable hepatosplenomegaly. No colostomy. No ileostomy.  Musculo: Walks with a normal gait.  Neuro: Alert and oriented x3. Affect is normal. No focal deficits appreciated.        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  We will likely have to remove some bowel.  Other areas maybe removed as well.    This could be  approached laparoscopically, but due to your prior open procedure most likely this will need to be open procedure.    Risks include but not limited to bleeding, infection, injury to other structures, hernia, need for more procedures, stoma, bowel resection.    Benefit to help remove a probable tumor/mass from the abdomen    Alternative of no procedure.    Bowel prep with oral antibiotics prescribed.    We will be coordinating for a date with Dr. Katrinka Blazing from gynecology oncology.      Plan Other: Hx             Med New        :  D3                       Seen by:        Julie Heckler, MD  Direct Patient Contact Time: I spent more than 30 minutes evaluating the patient's medical situation as it relates to colorectal surgery. This includes more than 50% face to face with patient (and/or family, proxy) and/or coordination of care comprised of review of imaging, laboratory, pathology, PCP/specialist reports, hospitalizations and generation of documents which can be used to communicate by our electronic health record with other providers.
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
## 2023-05-24 ENCOUNTER — Other Ambulatory Visit: Payer: Self-pay

## 2023-05-25 ENCOUNTER — Encounter: Payer: Self-pay | Admitting: Gastroenterology

## 2023-05-25 ENCOUNTER — Ambulatory Visit: Attending: Obstetrics & Gynecology | Admitting: Obstetrics & Gynecology

## 2023-05-25 VITALS — BP 132/80 | HR 83 | Temp 97.7°F | Resp 20 | Ht 60.0 in | Wt 156.0 lb

## 2023-05-25 DIAGNOSIS — D3910 Neoplasm of uncertain behavior of unspecified ovary: Secondary | ICD-10-CM | POA: Insufficient documentation

## 2023-05-25 DIAGNOSIS — Z01818 Encounter for other preprocedural examination: Secondary | ICD-10-CM | POA: Insufficient documentation

## 2023-05-25 DIAGNOSIS — Z712 Person consulting for explanation of examination or test findings: Secondary | ICD-10-CM | POA: Insufficient documentation

## 2023-05-25 NOTE — Invasive Procedure Plan of Care (Signed)
 CONSENT FOR MEDICAL  OR SURGICAL PROCEDURE                            Patient Name: Julie Walsh  [facility]                                                              DOB: 1950/07/19         Please read this form or have someone read it to you.   It's important to understand all parts of this form. If something isn't clear, ask us  to explain.   When you sign it, that means you understand the form and give us  permission to do this surgery or procedure.     I agree for Arma Lamp, DO along with any assistants* they may choose, to treat the following condition(s): Recurrent granulosa cell tumor, pelvic mass   By doing this surgery or procedure on me: Removal of mass, female organs for assessment of cancer   This is also known as: Exam under anesthesia, exploratory laparotomy, removal of pelvic mass, cancer debulking surgery, possible bowel surgery, additional surgery as indicated   Laterality: Not applicable     *if you'd like a list of the assistants, please ask. We can give that to you.    1. The care provider has explained my condition to me. They have told me how the procedure can help me. They have told me about other ways of treating my condition. I understand the care provider cannot guarantee the result of the procedure. If I don't have this procedure, my other choices are: Chemotherapy    2. The care provider has told me the risks (problems that can happen) of the procedure. I understand there may be unwanted results. The risks that are related to this procedure include: Injury to bowel, bladder, blood vessels, ureters, nerves, death. Post-op risks include but are not limited to blood clot formation (blood vessels/lungs), heart attack, stroke, infection, fistula formation, and chronic pain. Need for additional surgery.      3. I understand that during the procedure, my care provider may find a condition that we didn't know about before the treatment started. Therefore, I agree that my care  provider can perform any other treatment which they think is necessary and available.    4. I give permission to the hospital and/or its departments to examine and keep tissue, blood, body parts, fluids or materials removed from my body during the procedure(s) to aid in diagnosis and treatment, after which they may be used for scientific research or teaching by appropriate persons. If these materials are used for science or teaching, my identity will be protected. I will no longer own or have any rights to these materials regardless of how they may be used.    5. My care provider might want a representative from a medical device company to be there during my procedure. I understand that person works for:          The ways they might help my care provider during my procedure include:            6. Here are my decisions about receiving blood, blood products, or tissues. I understand my decisions  cover the time before, during and after my procedure, my treatment, and my time in the hospital. After my procedure, if my condition changes a lot, my care provider will talk with me again about receiving blood or blood products. At that time, my care provider might need me to review and sign another consent form, about getting or refusing blood.    I understand that the blood is from the community blood supply. Volunteers donated the blood, the volunteers were screened for health problems. The blood was examined with very sensitive and accurate tests to look for hepatitis, HIV/AIDS, and other diseases. Before I receive blood, it is tested again to make sure it is the correct type.    My chances of getting a sickness from blood products are small. But no transfusion is 100% safe. I understand that my care provider feels the good I will receive from the blood is greater than the chances of something going wrong. My care provider has answered my questions about blood products.      My decision  about blood or  blood products    Yes, I agree to receive blood or blood products if my care provider thinks they're needed.        My decision   about tissue  Implants     Not applicable.          I understand this  form.    My care provider  or his/her  assistants have  explained:   What I am having done and why I need it.  What other choices I can make instead of having this done.  The benefits and possible risks (problems) to me of having this done.  The benefits and possible risks (problems) to me of receiving transplants, blood, or blood products.  There is no guarantee of the results.  The care provider may not stay with me the entire time that I am in the operating or procedure room.  My provider has explained how this may affect my procedure. My provider has answered my  questions about this.         I give my  permission for  this surgery or  procedure.            _________________________________________                                     My signature  (or parent or other person authorized to sign for you, if you are unable to sign for yourself or if you are under 51 years old)             _____________     Date      (MM/DD/YYYY)           _______    Time      Electronic Signatures will display at the bottom of the consent form.    Care provider's statement: I have discussed the planned procedure, including the possibility for transfusion of blood  products or receipt of tissue as necessary; expected benefits; the possible complications and risks; and possible alternatives  and their benefits and risks with the patients or the patient's surrogate. In my opinion, the patient or the patient's surrogate  understands the proposed procedure, its risks, benefits and alternatives.              Electronically signed by: Arma Lamp, DO  05/25/2023         Date        8:46 AM        Time   Your doctor or someone your doctor has appointed has told you that you may need blood or a blood product  transfusion, which has been collected from volunteers, as part of your treatment as a patient.    The reasons you might need blood or blood products include, but are not limited to:   Significant loss of your own blood  Your body may not be getting enough oxygen to its tissues   Treatment of bleeding disorders caused by low platelet counts or platelets that do not work right (platelets are part of a cell that helps to form clots and keeps you from bleeding too much).  You may not have enough of other substances that help your blood clot or stop you from bleeding more  The risks of getting a transfusion of blood or blood products include, but are not limited to:   Damage to the lungs  Difficulty breathing due to fluid in the lungs  The product may contain bacteria or rarely a virus (which includes HIV and Hepatitis)  Blood from the community blood supply has been collected from volunteer donors who have been screened for health risk. The blood has been tested for major blood transmitted disease, but no transfusion is 100% safe. The blood is tested with very sensitive and accurate tests to screen for hepatitis, AIDS, and other disease, which makes the risks very small.  You may have side effects from the transfusion (rash, fever, chills) or an allergic reaction  The transfusion increases your risks of getting infection or cancer coming back  The transfusion can increase the time you have to stay in the hospital  The transfusion can potentially cause death if the wrong blood is given or your body rejects the blood  Before blood is transfused, it is tested again to make sure it is the correct type  There are other options than getting blood or blood products from other people and they include:   Drugs which can decrease bleeding  Drugs which can cause your body to make more blood (used in elective procedures with advance notice)  Autologous (your own blood) donation (needs pre-arrangement)  No transfusion  If you  exercise your right to refuse to be transfused with blood or blood products; these things listed below, among others, could happen to you:  Your body may not get enough oxygen and suffer damage  You may have a higher chance of bleeding  You may limit other options for your condition  You may die from losing too much blood

## 2023-05-25 NOTE — Progress Notes (Signed)
 Patient educated on exam under anesthesia, exploratory laparotomy, removal of pelvic mass, cancer debulking surgery, possible bowel surgery, additional surgery as indicated with Dr. Danley Dusky - combined case with Dr. Quenten Brunette. Given instructions to [facility]. Aware our team will be calling with date of surgery. Patient instructed to stop all anti-inflammatory medications, vitamins, herbal teas and supplements. Medications reviewed with patient. Patient instructed to hold apixaban  (ELIQUIS ) 5 mg daily for 48 hrs prior to surgery. Pre-operative labs to be completed within 40 days of surgery - orders placed for CBC, BMP, aPTT, Protime-INR, T&S. Pre-op imaging - CT C/A/P completed 05/07/2023.  Staff from [facility] will call patient the evening prior to surgery with arrival time. Patient aware to arrange for transportation to and from surgery, as well has a responsible adult to stay with patient for 24 hours following surgery. NPO status reviewed - nothing to eat after midnight prior to surgery, can have clear liquids up until 2 hours prior to surgery. Pre-operative skin prep reviewed with patient. Instructed to bring insurance card, photo identification, and glasses/glasses case, hearing aids as well as any other medical devices requested by surgeon to hospital day of surgery and leave all other valuables at home if not needed. Advised to refrain from wearing jewelry, make-up, hair pins, body lotions or scents day of surgery. Body piercing's must be removed. Patient permitted one visitor at a time. **Subject to change pending [facility] visitor policy.    Patient instructed to call office post-op with any uncontrolled pain, signs/symptoms of infection.

## 2023-05-29 ENCOUNTER — Telehealth: Payer: Self-pay | Admitting: Obstetrics & Gynecology

## 2023-05-29 NOTE — Telephone Encounter (Signed)
 Spoke with Julie Walsh. The surgery is scheduled for 5/6 but the OR is waiting to hear from Dr. Mickel nurse per email from [place] with OR on time for Dr. Candia. They will not put through until they hear from [facility] office. Writer informed Julie Walsh of this and advised that they are waiting to hear from his office. He said he would handle it and was appreciative of the call back.

## 2023-05-29 NOTE — Telephone Encounter (Signed)
 Constantino Demark returning call to [facility] regarding pt. Stated he tried calling the direct number he has but it stated the line was no longer in service and to call the main number.  Requesting call back, number verified. [phone]

## 2023-05-31 ENCOUNTER — Telehealth: Payer: Self-pay | Admitting: Obstetrics & Gynecology

## 2023-05-31 NOTE — Telephone Encounter (Signed)
 Julie Walsh is calling to schedule pt's 5/6 surgery at 7:30 am   Phone #[phone]

## 2023-05-31 NOTE — Telephone Encounter (Signed)
 Called Julie Walsh back and advised that the OR placed the case at 730 on 5/6.

## 2023-06-01 ENCOUNTER — Other Ambulatory Visit: Payer: Self-pay

## 2023-06-04 ENCOUNTER — Telehealth: Payer: Self-pay

## 2023-06-04 ENCOUNTER — Other Ambulatory Visit
Admission: RE | Admit: 2023-06-04 | Discharge: 2023-06-04 | Disposition: A | Discharge status: Home / Self Care | Source: Ambulatory Visit

## 2023-06-04 ENCOUNTER — Other Ambulatory Visit
Admission: RE | Admit: 2023-06-04 | Discharge: 2023-06-04 | Disposition: A | Discharge status: Home / Self Care | Source: Ambulatory Visit | Attending: Obstetrics & Gynecology | Admitting: Obstetrics & Gynecology

## 2023-06-04 DIAGNOSIS — Z01818 Encounter for other preprocedural examination: Secondary | ICD-10-CM | POA: Insufficient documentation

## 2023-06-04 DIAGNOSIS — D3910 Neoplasm of uncertain behavior of unspecified ovary: Secondary | ICD-10-CM | POA: Insufficient documentation

## 2023-06-04 DIAGNOSIS — Z712 Person consulting for explanation of examination or test findings: Secondary | ICD-10-CM | POA: Insufficient documentation

## 2023-06-04 DIAGNOSIS — R69 Illness, unspecified: Secondary | ICD-10-CM | POA: Insufficient documentation

## 2023-06-04 LAB — BASIC METABOLIC PANEL
Anion Gap: 14 (ref 7–16)
CO2: 24 mmol/L (ref 20–28)
Calcium: 9.2 mg/dL (ref 8.6–10.2)
Chloride: 103 mmol/L (ref 96–108)
Creatinine: 1.17 mg/dL — ABNORMAL HIGH (ref 0.51–0.95)
Glucose: 96 mg/dL (ref 60–99)
Lab: 16 mg/dL (ref 6–20)
Potassium: 4.2 mmol/L (ref 3.3–5.1)
Sodium: 141 mmol/L (ref 133–145)
eGFR BY CREAT: 49 * — AB

## 2023-06-04 LAB — APTT: aPTT: 29.4 s (ref 25.8–37.9)

## 2023-06-04 LAB — PROTIME-INR
INR: 1 (ref 0.9–1.1)
Protime: 10.8 s (ref 10.0–12.9)

## 2023-06-04 LAB — CBC
Hematocrit: 41 % (ref 34–49)
Hemoglobin: 12.8 g/dL (ref 11.2–16.0)
MCV: 97 fL (ref 75–100)
Platelets: 217 10*3/uL (ref 150–450)
RBC: 4.2 MIL/uL (ref 4.0–5.5)
RDW: 13.2 % (ref 0.0–15.0)
WBC: 5.9 10*3/uL (ref 3.5–11.0)

## 2023-06-04 NOTE — Telephone Encounter (Signed)
 Date of procedure: 5.6.25  Type of stay: Surgery Admit  Provider: [name]  NPI: [id number]  Tax ID: [id number]    Location: [facility]  NPI: [id number]  Tax ID: [id number]    CPT (with description): Exploratory Laparotomy resection of pelvic mass 50999    Possible:     ICD-10 codes (with description): Pelvic Mass R19.00    Insurance: Medicare AB  Insurance ID#:  [id number]

## 2023-06-05 ENCOUNTER — Encounter
Admission: RE | Disposition: A | Discharge status: Home / Self Care | Payer: Self-pay | Source: Ambulatory Visit | Attending: Obstetrics & Gynecology

## 2023-06-05 ENCOUNTER — Inpatient Hospital Stay: Admitting: Student in an Organized Health Care Education/Training Program

## 2023-06-05 ENCOUNTER — Other Ambulatory Visit: Payer: Self-pay

## 2023-06-05 ENCOUNTER — Encounter: Payer: Self-pay | Admitting: Obstetrics & Gynecology

## 2023-06-05 ENCOUNTER — Inpatient Hospital Stay
Admission: RE | Admit: 2023-06-05 | Discharge: 2023-06-08 | DRG: 330 | Disposition: A | Discharge status: Home / Self Care | Payer: Self-pay | Source: Ambulatory Visit | Attending: Obstetrics & Gynecology | Admitting: Obstetrics & Gynecology

## 2023-06-05 DIAGNOSIS — D3910 Neoplasm of uncertain behavior of unspecified ovary: Secondary | ICD-10-CM

## 2023-06-05 DIAGNOSIS — C786 Secondary malignant neoplasm of retroperitoneum and peritoneum: Secondary | ICD-10-CM | POA: Diagnosis present

## 2023-06-05 DIAGNOSIS — K219 Gastro-esophageal reflux disease without esophagitis: Secondary | ICD-10-CM | POA: Diagnosis present

## 2023-06-05 DIAGNOSIS — Z8673 Personal history of transient ischemic attack (TIA), and cerebral infarction without residual deficits: Secondary | ICD-10-CM

## 2023-06-05 DIAGNOSIS — F419 Anxiety disorder, unspecified: Secondary | ICD-10-CM | POA: Diagnosis present

## 2023-06-05 DIAGNOSIS — Z8543 Personal history of malignant neoplasm of ovary: Secondary | ICD-10-CM

## 2023-06-05 DIAGNOSIS — J449 Chronic obstructive pulmonary disease, unspecified: Secondary | ICD-10-CM | POA: Diagnosis present

## 2023-06-05 DIAGNOSIS — N189 Chronic kidney disease, unspecified: Secondary | ICD-10-CM | POA: Diagnosis present

## 2023-06-05 DIAGNOSIS — R1901 Right upper quadrant abdominal swelling, mass and lump: Secondary | ICD-10-CM | POA: Diagnosis present

## 2023-06-05 DIAGNOSIS — E785 Hyperlipidemia, unspecified: Secondary | ICD-10-CM | POA: Diagnosis present

## 2023-06-05 DIAGNOSIS — M5430 Sciatica, unspecified side: Secondary | ICD-10-CM | POA: Diagnosis present

## 2023-06-05 DIAGNOSIS — C569 Malignant neoplasm of unspecified ovary: Secondary | ICD-10-CM

## 2023-06-05 DIAGNOSIS — C563 Malignant neoplasm of bilateral ovaries: Secondary | ICD-10-CM

## 2023-06-05 DIAGNOSIS — C784 Secondary malignant neoplasm of small intestine: Principal | ICD-10-CM | POA: Diagnosis present

## 2023-06-05 DIAGNOSIS — C7960 Secondary malignant neoplasm of unspecified ovary: Secondary | ICD-10-CM | POA: Diagnosis present

## 2023-06-05 DIAGNOSIS — F32A Depression, unspecified: Secondary | ICD-10-CM | POA: Diagnosis present

## 2023-06-05 DIAGNOSIS — Z86711 Personal history of pulmonary embolism: Secondary | ICD-10-CM

## 2023-06-05 DIAGNOSIS — R1900 Intra-abdominal and pelvic swelling, mass and lump, unspecified site: Principal | ICD-10-CM

## 2023-06-05 DIAGNOSIS — Z86718 Personal history of other venous thrombosis and embolism: Secondary | ICD-10-CM

## 2023-06-05 DIAGNOSIS — G500 Trigeminal neuralgia: Secondary | ICD-10-CM | POA: Diagnosis present

## 2023-06-05 DIAGNOSIS — Z87891 Personal history of nicotine dependence: Secondary | ICD-10-CM

## 2023-06-05 DIAGNOSIS — G8918 Other acute postprocedural pain: Secondary | ICD-10-CM

## 2023-06-05 HISTORY — PX: PR ENTRC RESCJ SMALL INTESTINE 1 RESCJ & ANAST: 44120

## 2023-06-05 HISTORY — PX: PR EXPLORATORY LAPAROTOMY CELIOTOMY W/WO BIOPSY SPX: 49000

## 2023-06-05 HISTORY — PX: PR OMNTC EPIPLOECTOMY RESCJ OMENTUM SPX: 49255

## 2023-06-05 LAB — CBC AND DIFFERENTIAL
Baso # K/uL: 0 10*3/uL (ref 0.0–0.2)
Eos # K/uL: 0 10*3/uL (ref 0.0–0.5)
Hematocrit: 39 % (ref 34–49)
Hemoglobin: 12.4 g/dL (ref 11.2–16.0)
IMM Granulocytes #: 0 10*3/uL (ref 0.0–0.0)
IMM Granulocytes: 0.2 %
Lymph # K/uL: 1 10*3/uL (ref 1.0–5.0)
MCV: 94 fL (ref 75–100)
Mono # K/uL: 0.8 10*3/uL (ref 0.1–1.0)
Neut # K/uL: 7.8 10*3/uL — ABNORMAL HIGH (ref 1.5–6.5)
Nucl RBC # K/uL: 0 10*3/uL (ref 0.0–0.0)
Nucl RBC %: 0 /100{WBCs} (ref 0.0–0.2)
Platelets: 204 10*3/uL (ref 150–450)
RBC: 4.1 MIL/uL (ref 4.0–5.5)
RDW: 13.2 % (ref 0.0–15.0)
Seg Neut %: 81.3 %
WBC: 9.6 10*3/uL (ref 3.5–11.0)

## 2023-06-05 LAB — TYPE AND SCREEN
ABO RH Blood Type: B NEG
Antibody Screen: NEGATIVE

## 2023-06-05 LAB — BASIC METABOLIC PANEL
Anion Gap: 10 (ref 7–16)
CO2: 23 mmol/L (ref 20–28)
Calcium: 9 mg/dL (ref 8.6–10.2)
Chloride: 104 mmol/L (ref 96–108)
Creatinine: 0.94 mg/dL (ref 0.51–0.95)
Glucose: 159 mg/dL — ABNORMAL HIGH (ref 60–99)
Lab: 13 mg/dL (ref 6–20)
Potassium: 4.1 mmol/L (ref 3.3–5.1)
Sodium: 137 mmol/L (ref 133–145)
eGFR BY CREAT: 63 *

## 2023-06-05 LAB — MAGNESIUM: Magnesium: 1.6 mg/dL (ref 1.6–2.5)

## 2023-06-05 LAB — POCT GLUCOSE: Glucose POCT: 133 mg/dL — ABNORMAL HIGH (ref 60–99)

## 2023-06-05 SURGERY — LAPAROTOMY, EXPLORATORY
Anesthesia: General | Site: Pelvis | Wound class: Clean Contaminated

## 2023-06-05 MED ORDER — FAMOTIDINE 20 MG PO TABS *I*
20.0000 mg | ORAL_TABLET | Freq: Every evening | ORAL | Status: DC
Start: 2023-06-06 — End: 2023-06-08
  Administered 2023-06-06 – 2023-06-07 (×2): 20 mg via ORAL
  Filled 2023-06-05 (×2): qty 1

## 2023-06-05 MED ORDER — DEXTROSE 5 % FLUSH FOR PUMPS *I*
0.0000 mL/h | INTRAVENOUS | Status: DC | PRN
Start: 2023-06-05 — End: 2023-06-05

## 2023-06-05 MED ORDER — ATORVASTATIN CALCIUM 40 MG PO TABS *I*
40.0000 mg | ORAL_TABLET | Freq: Every day | ORAL | Status: DC
Start: 2023-06-06 — End: 2023-06-08
  Administered 2023-06-06 – 2023-06-08 (×3): 40 mg via ORAL
  Filled 2023-06-05 (×3): qty 1

## 2023-06-05 MED ORDER — GABAPENTIN 100 MG PO CAPSULE *I*
200.0000 mg | ORAL_CAPSULE | Freq: Every day | ORAL | Status: DC
Start: 2023-06-06 — End: 2023-06-08
  Administered 2023-06-06 – 2023-06-08 (×3): 200 mg via ORAL
  Filled 2023-06-05 (×3): qty 2

## 2023-06-05 MED ORDER — ACETAMINOPHEN 325 MG PO TABS *I*
650.0000 mg | ORAL_TABLET | Freq: Four times a day (QID) | ORAL | Status: DC
Start: 2023-06-05 — End: 2023-06-08
  Administered 2023-06-05 – 2023-06-08 (×12): 650 mg via ORAL
  Filled 2023-06-05 (×12): qty 2

## 2023-06-05 MED ORDER — LACTATED RINGERS IV SOLN *I*
125.0000 mL/h | INTRAVENOUS | Status: DC
Start: 2023-06-05 — End: 2023-06-05
  Administered 2023-06-05: 11:00:00 125 mL/h via INTRAVENOUS

## 2023-06-05 MED ORDER — HYDROMORPHONE HCL PF 1 MG/ML IJ SOLN *WRAPPED*
INTRAMUSCULAR | Status: AC
Start: 2023-06-05 — End: 2023-06-05
  Filled 2023-06-05: qty 1

## 2023-06-05 MED ORDER — PHENYLEPHRINE 100 MCG/ML IN NS 10 ML *WRAPPED*
INTRAMUSCULAR | Status: DC | PRN
Start: 2023-06-05 — End: 2023-06-05
  Administered 2023-06-05 (×5): 100 ug via INTRAVENOUS

## 2023-06-05 MED ORDER — FENTANYL CITRATE 50 MCG/ML IJ SOLN *WRAPPED*
INTRAMUSCULAR | Status: AC
Start: 2023-06-05 — End: 2023-06-05
  Filled 2023-06-05: qty 2

## 2023-06-05 MED ORDER — METRONIDAZOLE IN NACL 5 MG/ML IV SOLUTION *WRAPPED*
500.0000 mg | Freq: Once | INTRAVENOUS | Status: AC
Start: 2023-06-05 — End: 2023-06-05
  Administered 2023-06-05: 08:00:00 500 mg via INTRAVENOUS
  Filled 2023-06-05: qty 100

## 2023-06-05 MED ORDER — FENTANYL CITRATE 50 MCG/ML IJ SOLN *WRAPPED*
INTRAMUSCULAR | Status: DC | PRN
Start: 2023-06-05 — End: 2023-06-05
  Administered 2023-06-05 (×2): 50 ug via INTRAVENOUS

## 2023-06-05 MED ORDER — OXYCODONE HCL 5 MG PO TABS *I*
5.0000 mg | ORAL_TABLET | ORAL | Status: DC | PRN
Start: 2023-06-05 — End: 2023-06-08
  Administered 2023-06-05 – 2023-06-08 (×6): 5 mg via ORAL
  Filled 2023-06-05 (×6): qty 1

## 2023-06-05 MED ORDER — LACTATED RINGERS IV SOLN *I*
100.0000 mL/h | INTRAVENOUS | Status: AC
Start: 2023-06-05 — End: 2023-06-05
  Administered 2023-06-05 (×2): 125 mL/h via INTRAVENOUS
  Administered 2023-06-05: 18:00:00 100 mL/h via INTRAVENOUS
  Administered 2023-06-05: 12:00:00 125 mL/h via INTRAVENOUS

## 2023-06-05 MED ORDER — GABAPENTIN 300 MG PO CAPSULE *I*
300.0000 mg | ORAL_CAPSULE | Freq: Every evening | ORAL | Status: DC
Start: 2023-06-05 — End: 2023-06-08
  Administered 2023-06-05 – 2023-06-07 (×3): 300 mg via ORAL
  Filled 2023-06-05 (×3): qty 1

## 2023-06-05 MED ORDER — SUGAMMADEX SODIUM 100 MG/1ML IV SOLN *WRAPPED*
INTRAVENOUS | Status: DC | PRN
Start: 2023-06-05 — End: 2023-06-05
  Administered 2023-06-05: 11:00:00 200 mg via INTRAVENOUS

## 2023-06-05 MED ORDER — PROPOFOL 10 MG/ML IV EMUL (INTERMITTENT DOSING) WRAPPED *I*
INTRAVENOUS | Status: DC | PRN
Start: 2023-06-05 — End: 2023-06-05
  Administered 2023-06-05: 08:00:00 100 mg via INTRAVENOUS

## 2023-06-05 MED ORDER — SENNOSIDES 8.6 MG PO TABS *I*
2.0000 | ORAL_TABLET | Freq: Every evening | ORAL | Status: DC
Start: 2023-06-05 — End: 2023-08-04
  Administered 2023-06-05 – 2023-06-07 (×3): 2 via ORAL
  Filled 2023-06-05 (×3): qty 2

## 2023-06-05 MED ORDER — ONDANSETRON HCL 2 MG/ML IV SOLN *I*
4.0000 mg | INTRAMUSCULAR | Status: DC | PRN
Start: 2023-06-05 — End: 2023-06-08

## 2023-06-05 MED ORDER — HALOPERIDOL LACTATE 5 MG/ML IJ SOLN *I*
0.5000 mg | INTRAMUSCULAR | Status: DC | PRN
Start: 2023-06-05 — End: 2023-06-08

## 2023-06-05 MED ORDER — POLYETHYLENE GLYCOL 3350 PO PACK 17 GM *I*
17.0000 g | PACK | Freq: Every day | ORAL | Status: DC
Start: 2023-06-05 — End: 2023-08-04
  Administered 2023-06-05 – 2023-06-08 (×4): 17 g via ORAL
  Filled 2023-06-05 (×4): qty 17

## 2023-06-05 MED ORDER — ROCURONIUM BROMIDE 10 MG/ML IV SOLN *WRAPPED*
Status: DC | PRN
Start: 2023-06-05 — End: 2023-06-05
  Administered 2023-06-05: 08:00:00 40 mg via INTRAVENOUS
  Administered 2023-06-05 (×2): 20 mg via INTRAVENOUS

## 2023-06-05 MED ORDER — GLYCERIN (ADULT) 2 GM RE SUPP *I*
1.0000 | Freq: Once | RECTAL | Status: AC
Start: 2023-06-06 — End: 2023-06-06
  Administered 2023-06-06: 1 via RECTAL
  Filled 2023-06-05 (×2): qty 1

## 2023-06-05 MED ORDER — LIDOCAINE HCL (PF) 1 % IJ SOLN *I*
0.1000 mL | INTRAMUSCULAR | Status: DC | PRN
Start: 2023-06-05 — End: 2023-06-05
  Administered 2023-06-05: 07:00:00 0.1 mL via SUBCUTANEOUS
  Filled 2023-06-05: qty 2

## 2023-06-05 MED ORDER — CEFAZOLIN 2000 MG IN STERILE WATER 20ML SYRINGE *I*
2000.0000 mg | PREFILLED_SYRINGE | Freq: Once | INTRAVENOUS | Status: AC
Start: 2023-06-05 — End: 2023-06-05
  Administered 2023-06-05: 08:00:00 2000 mg via INTRAVENOUS
  Filled 2023-06-05: qty 20

## 2023-06-05 MED ORDER — SODIUM CHLORIDE 0.9 % FLUSH FOR PUMPS *I*
0.0000 mL/h | INTRAVENOUS | Status: DC | PRN
Start: 2023-06-05 — End: 2023-06-05

## 2023-06-05 MED ORDER — ACETAMINOPHEN 500 MG PO TABS *I*
1000.0000 mg | ORAL_TABLET | Freq: Once | ORAL | Status: DC
Start: 2023-06-05 — End: 2023-06-05

## 2023-06-05 MED ORDER — LACTATED RINGERS IV SOLN *I*
20.0000 mL/h | INTRAVENOUS | Status: DC
Start: 2023-06-05 — End: 2023-06-05
  Administered 2023-06-05: 07:00:00 20 mL/h via INTRAVENOUS

## 2023-06-05 MED ORDER — SODIUM CHLORIDE 0.9 % IV SOLN WRAPPED *I*
20.0000 mL/h | Status: DC
Start: 2023-06-05 — End: 2023-06-05

## 2023-06-05 MED ORDER — ONDANSETRON HCL 2 MG/ML IV SOLN *I*
INTRAMUSCULAR | Status: DC | PRN
Start: 2023-06-05 — End: 2023-06-05
  Administered 2023-06-05: 10:00:00 4 mg via INTRAVENOUS

## 2023-06-05 MED ORDER — HYDROMORPHONE HCL PF 1 MG/ML IJ SOLN *WRAPPED*
INTRAMUSCULAR | Status: DC | PRN
Start: 2023-06-05 — End: 2023-06-05
  Administered 2023-06-05 (×2): .25 mg via INTRAVENOUS
  Administered 2023-06-05: 09:00:00 .5 mg via INTRAVENOUS

## 2023-06-05 MED ORDER — HALOPERIDOL LACTATE 5 MG/ML IJ SOLN *I*
0.5000 mg | Freq: Once | INTRAMUSCULAR | Status: DC | PRN
Start: 2023-06-05 — End: 2023-06-05

## 2023-06-05 MED ORDER — HYDROMORPHONE HCL PF 1 MG/ML IJ SOLN *WRAPPED*
0.5000 mg | INTRAMUSCULAR | Status: DC | PRN
Start: 2023-06-05 — End: 2023-06-05
  Administered 2023-06-05: 12:00:00 0.5 mg via INTRAVENOUS
  Filled 2023-06-05: qty 0.5

## 2023-06-05 MED ORDER — LIDOCAINE HCL 2 % IJ SOLN *I*
INTRAMUSCULAR | Status: DC | PRN
Start: 2023-06-05 — End: 2023-06-05
  Administered 2023-06-05: 08:00:00 60 mg via INTRAVENOUS

## 2023-06-05 MED ORDER — DEXAMETHASONE SODIUM PHOSPHATE 4 MG/ML INJ SOLN *WRAPPED*
INTRAMUSCULAR | Status: DC | PRN
Start: 2023-06-05 — End: 2023-06-05
  Administered 2023-06-05: 08:00:00 4 mg via INTRAVENOUS

## 2023-06-05 MED ORDER — PROCHLORPERAZINE EDISYLATE 5 MG/ML IJ SOLN WRAPPED *I*
5.0000 mg | Freq: Once | INTRAMUSCULAR | Status: DC | PRN
Start: 2023-06-05 — End: 2023-06-05

## 2023-06-05 MED ORDER — HEPARIN SODIUM 5000 UNIT/ML SQ *I*
5000.0000 [IU] | Freq: Once | SUBCUTANEOUS | Status: AC
Start: 2023-06-05 — End: 2023-06-05
  Administered 2023-06-05: 07:00:00 5000 [IU] via SUBCUTANEOUS
  Filled 2023-06-05: qty 1

## 2023-06-05 MED ORDER — BUPIVACAINE-EPINEPHRINE 0.25 % IJ SOLUTION *WRAPPED*
INTRAMUSCULAR | Status: DC | PRN
Start: 2023-06-05 — End: 2023-06-05
  Administered 2023-06-05 (×2): 30 mL via PERINEURAL

## 2023-06-05 MED ORDER — TRAZODONE HCL 50 MG PO TABS *I*
100.0000 mg | ORAL_TABLET | Freq: Every evening | ORAL | Status: DC
Start: 2023-06-05 — End: 2023-08-04
  Administered 2023-06-05 – 2023-06-07 (×3): 100 mg via ORAL
  Filled 2023-06-05 (×3): qty 2

## 2023-06-05 SURGICAL SUPPLY — 37 items
BLADE SURG CARBON STEEL #10 STER (Supply) ×2 IMPLANT
COVER TABLE 60X90 REG DUTY (Drape) ×2 IMPLANT
ELECTRODE ADULT POLYHESIVE PAT RETURN (Supply) ×2 IMPLANT
ELECTRODE EXT BLADE SS 6.5IN (Supply) ×2 IMPLANT
GLOVE BIOGEL PI MICRO IND UNDER SZ 6.5 LF (Glove) ×2 IMPLANT
GLOVE BIOGEL PI MICRO IND UNDER SZ 7.0 LF (Glove) ×6 IMPLANT
GLOVE BIOGEL PI MICRO IND UNDER SZ 7.5 LF (Glove) ×2 IMPLANT
GLOVE BIOGEL PI MICRO SZ 6 (Glove) ×2 IMPLANT
GLOVE SURG BIOGEL PI ULTRATOUCH SZ 6.0 (Glove) ×4 IMPLANT
GLOVE SURG BIOGEL PI ULTRATOUCH SZ 7.0 (Glove) ×8 IMPLANT
GOWN SIRUS FABRIC REINFORCED SET IN XL (Gown) ×4 IMPLANT
INSTRUMENT LIGASURE IMPACT OPEN TISSUE (Other) ×2 IMPLANT
PACK CUSTOM COLORECTAL CLOSING (Pack) ×2 IMPLANT
PACK CUSTOM GENERAL DAVINCI PROCEDURE (Pack) ×2 IMPLANT
PAD MEDIPORE 9CM X 25CM (Dressing) ×2 IMPLANT
PAD SANITARY W/ ADHESIVE (Supply) ×2 IMPLANT
PENCIL SMOKE EVAC COATED PB (Supply) ×2 IMPLANT
RELOAD ECHELON GST BLUE (Supply) ×6 IMPLANT
SEPRAFILM BIORESORB MEMBRANE 5INX6IN (Dressing) ×8 IMPLANT
SOL WATER IRRIG STERILE 1000ML BTL (Solution) ×2 IMPLANT
SPONGE GAUZE POST-OP 3X4IN LF STER (Dressing) ×4 IMPLANT
SPONGE LAPAROTOMY W18XL18IN 7IN LOOP WHITE COTTON 4 PLY RADIOPAQUE PREWASHED DELINTED (Sponge) ×8 IMPLANT
SPONGE SURGICEL ABS 4 X 8IN (Supply) ×2 IMPLANT
STAPLER LINEAR TX 60X3.5MM (Supply) ×2 IMPLANT
STAPLER MEDICAL SHAFT L280MM JAW L60MM COMPACT ECHELON 3000 (Supply) ×2 IMPLANT
SUCTION YANKAUER BULB TIP WITH ON/OFF CONTROL STERILE (Supply) ×2 IMPLANT
SUTR PDS II MONO 1 TP-1 96IN VIOLET (Suture) ×2 IMPLANT
SUTR VICRYL ANITIB 3-0 SH 27 VIOLET (Suture) ×6 IMPLANT
SUTR VICRYL ANTIB 3-0 SH 18 UNDY (Suture) ×4 IMPLANT
SUTR VICRYL CTD 0 54 IN VIOLET (Suture) ×2 IMPLANT
SUTR VICRYL CTD 0 CT-1 VIOLET (Suture) ×12 IMPLANT
SUTR VICRYL CTD 0 SUTUPAK VIOLET (Suture) ×2 IMPLANT
SUTURE QUILL SZ 2 L2IN ABSRB VLT L48MM 1/2 CIR DA TAPR CUT NDL POLYDIOXANONE MFIL (Suture) ×2 IMPLANT
SUTURE STRATAFIX MONOCRYL PLUS 3-0 L30X30CM ABSORBABLE UNDYED PS-1 TRICLOSAN POLIGLECAPRONE 25 SPIRAL (Suture) ×2 IMPLANT
SYRINGE IRRIG 60ML BULB SOFT PLIABLE ONE HANDED USE W/TYVEK LID (Syringe) ×2 IMPLANT
TOWEL SURGICAL W17XL27IN GREEN DELUXE NONFENESTRATED RADPQ PREWASHED DELINTED DISP (Towel) ×2 IMPLANT
TRAY URINARY CATHETERIZATION 14FR SILICONE FOLEY CATH W/DRAINAGE BAG STATLOCK STABILIZATION DEVICE DISP SURESTEP (Tray) ×2 IMPLANT

## 2023-06-05 NOTE — Progress Notes (Addendum)
 [facility]  Ronal Dene Duster, M.D.   Huey French, M.D.   Steven Ognibene, M.D. Dick Kurk, M.D.     Will Holly, M.D.   Redell Councilman, M.D.  Belvie Eck, MD   Beverley Africa, MD        Colorectal Surgery Progress Note      Subjective / Interval Events:   Seen after ex lap and SBR  Feeling well since surgery, minimal pain  No n/v.   Feeling tired and has not had anything to drink yet  Has not yet ambulated    Objective:   Patient Vitals for the past 8 hrs:   BP Temp Temp src Pulse Resp SpO2   06/05/23 1349 135/75 35.8 C (96.4 F) Infrared 75 14 97 %   06/05/23 1300 114/61 -- -- 83 (!) 9 97 %   06/05/23 1245 105/53 -- -- 79 11 95 %   06/05/23 1230 94/58 -- -- 81 12 95 %   06/05/23 1215 95/54 -- -- 79 12 94 %   06/05/23 1200 97/55 -- -- 77 13 95 %   06/05/23 1145 107/56 36.4 C (97.5 F) TEMPORAL 79 13 94 %   06/05/23 1130 117/57 -- -- 83 14 95 %   06/05/23 1115 109/60 -- -- 82 14 94 %   06/05/23 1105 -- -- -- 83 14 94 %   06/05/23 1100 109/55 -- -- 83 15 90 %   06/05/23 1050 113/56 -- -- 88 17 94 %   06/05/23 1047 127/58 36.3 C (97.3 F) TEMPORAL 91 18 93 %     Physical Exam:   Gen: No acute distress  CV: RRR  Lungs: No dyspnea  Abd: Moderately firm, non-distended, moderately tender to palpation with midline incision dressing clean and dry    (see end of note  for additional objective data)    Assessment / Plan:   Ms. Kienitz is a 73 y.o. 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 S/p EBRT to pelvic lesion now s/p ex lap with LOA and removal of small bowel implants with SBR 06/05/23 with Dr. [name] and Dr. Councilman. Doing well post-operatively.     -- CLD, advance to transitional diet on POD1 if doing well  -- Await return of bowel function  -- Remainder of care per GYN team    Signed:   Jon Flatten MD, PGY2 General Surgery      [facility]  [address], [address]rd floor  [place], [place] [address]     [phone]   Fax:[phone]      Temp (24hrs), Avg:36.3 C (97.3 F), Min:35.8 C (96.4 F), Max:36.6 C (97.9 F)    Patient Vitals for the past 24 hrs:   BP Temp Temp src Pulse Resp SpO2 Height Weight   06/05/23 1349 135/75 35.8 C (96.4 F) Infrared 75 14 97 % -- --   06/05/23 1300 114/61 -- -- 83 (!) 9 97 % -- --   06/05/23 1245 105/53 -- -- 79 11 95 % -- --   06/05/23 1230 94/58 -- -- 81 12 95 % -- --   06/05/23 1215 95/54 -- -- 79 12 94 % -- --   06/05/23 1200 97/55 -- -- 77 13 95 % -- --   06/05/23 1145 107/56 36.4 C (97.5 F) TEMPORAL 79 13 94 % -- --  06/05/23 1130 117/57 -- -- 83 14 95 % -- --   06/05/23 1115 109/60 -- -- 82 14 94 % -- --   06/05/23 1105 -- -- -- 83 14 94 % -- --   06/05/23 1100 109/55 -- -- 83 15 90 % -- --   06/05/23 1050 113/56 -- -- 88 17 94 % -- --   06/05/23 1047 127/58 36.3 C (97.3 F) TEMPORAL 91 18 93 % -- --   06/05/23 0653 120/67 36.6 C (97.9 F) Infrared 79 16 98 % 1.524 m (5') 68.7 kg (151 lb 6.4 oz)     I/O last 3 completed shifts:  05/05 1500 - 05/06 1459  In: 2868.3 (41.8 mL/kg) [I.V.:2748.3 (1.7 mL/kg/hr); IV Piggyback:120]  Out: 275 (4 mL/kg) [Urine:200 (0.1 mL/kg/hr); Blood:75]  Net: 2593.3  Weight: 68.7 kg   No intake/output data recorded.    Medications:  Current Facility-Administered Medications   Medication Dose Route Frequency    [START ON 06/06/2023] atorvastatin  (LIPITOR) tablet 40 mg  40 mg Oral Daily    [START ON 06/06/2023] famotidine  (PEPCID ) tablet 20 mg  20 mg Oral Nightly    [START ON 06/06/2023] gabapentin  (NEURONTIN ) capsule 300 mg  300 mg Oral Nightly    gabapentin  (NEURONTIN ) capsule 200 mg  200 mg Oral Daily    traZODone  (DESYREL ) tablet 100 mg  100 mg Oral Nightly    acetaminophen  (TYLENOL ) tablet 650 mg  650 mg Oral Q6H    oxyCODONE  (ROXICODONE ) immediate release tablet 5 mg  5 mg Oral Q4H PRN    senna (SENOKOT) tablet 2 tablet  2 tablet Oral Nightly    polyethylene glycol  (GLYCOLAX ,MIRALAX ) powder 17 g  17 g Oral Daily    ondansetron  (ZOFRAN ) injection 4 mg  4 mg Intravenous Q4H PRN    haloperidol  lactate (HALDOL ) injection 0.5 mg  0.5 mg Intravenous Q3H PRN    Lactated Ringers  Infusion  125 mL/hr Intravenous Continuous    [START ON 06/06/2023] glycerin  adult 1 suppository  1 suppository Rectal Once           Labs/Imaging:   Lab Results   Component Value Date    WBC 5.9 06/04/2023    HGB 12.8 06/04/2023    HCT 41 06/04/2023    MCV 97 06/04/2023    RDW 13.2 06/04/2023    PLT 217 06/04/2023       Lab Results   Component Value Date    CREAT 1.17 (H) 06/04/2023    NA 141 06/04/2023    K 4.2 06/04/2023    CL 103 06/04/2023    CO2 24 06/04/2023    ALT 22 05/03/2023    AST 24 05/03/2023    ALB 3.9 05/03/2023    CA 9.2 06/04/2023       No results found for: FIO2, PH, O2SAT

## 2023-06-05 NOTE — Op Note (Signed)
 Julie Walsh  [id number]  Mar 06, 1950    SERVICE: GYNECOLOGY ONCOLOGY   06/05/2023     PRE-OP DIAGNOSIS:   73 y.o. 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 Ulanda Sharps, DO.   ASSISTANTS: Laurita Sink  Planned combined case: Dr. Redell Councilman, MD     ANESTHESIA: General endotracheal anesthesia, TAP blocks.     OPERATION:   1. Radical resection recurrent ovarian malignancy: exploratory laparotomy, removal of small bowel, peritoneal implants, removal of portion of omentum, small bowel resection with reanastomosis, lysis of adhesions with oversewing serotomy, optimal microscopic cytoreduction at the end of the case     FINDINGS: Upon entering the abdomen, there were adhesions of the small bowel to the anterior abdominal wall and inter bowel adhesions were noted. The uterus and adnexa were absent. The CT scan images were used to locate and remove the noted nodules. Additional smaller nodules along the bowel were noted and removed. There was an omental implant that was removed. The largest implant along the left side of the pelvis was contained within a segment of small bowel. At the completion of the case, there were no obvious tumor implants noted.     ESTIMATED BLOOD LOSS: 75 mL.   DRAINS: Urinary Foley catheter.   FLUIDS: As per anesthesia.   URINE OUTPUT: 200 mL of clear urine at the end of the case.     SPECIMEN:   Small bowel implant  Transverse colon implant  Pelvic small bowel implant  Small bowel implant #2  Omentum  Colonic implant  Right upper quadrant mass  Small bowel mass with associated small bowel    COMPLICATIONS: None.   CONDITION: Stable.   DISPOSITION: PACU.     INDICATIONS AND  HISTORY: Julie Walsh is a very pleasant, 73 year old female. She has been followed in the practice for a recurrent granulosa cell tumor. Despite treatments, the larger mass continues to grow and her inhibin levels are increasing. She was seen by Dr. Councilman an a combined case was planned. All questions were answered. Informed consent was obtained prior to taking the patient to the operating room.      DESCRIPTION OF OPERATION: The patient was identified and the procedure was verified. She was taken to the operating room with her IV fluids running. She was successfully intubated by Anesthesia, and prepped and draped in the usual sterile fashion after being placed in the dorsal lithotomy position. The Foley catheter was inserted under sterile conditions. Her preoperative antibiotics were completed prior to initiation of our skin incision.     A vertical midline skin incision was made using a scalpel and carried down to the level of the fascia using the Bovie. The fascia was identified and opened the entire length of the incision with the Bovie. The peritoneum was identified and entered bluntly. Bowel adhesions were lysed and the incision was opened the length of the incision using the Bovie. Intra-abdominal contents are described as noted above.     Lysis of adhesions was completed with blunt dissection and Metzenbaum scissors. The entire bowel was eventually freed of  all adhesions. The previous anastomosis was protected. Implants were noted throughout this process. There were grasped with a Babcock and removed with scissors. The areas on the bowel were oversewn with 3-0 Vicryl when needed. The right upper/mid quadrant mass was located and removed with the LigaSure. An omental mass was removed with the LigaSure. Hemostasis was noted. The segment of small bowel containing the larger of the masses was freed from all attachments. Resection and reanastomosis was completed as per Dr. Mickel documentation. The bowel was  completed evaluated. Hemostasis was noted, the bowel was intact.     Copious irrigation was completed and all clots and debris were removed from the abdomen and pelvis. Four pieces of SepraFilm were placed in the abdomen and pelvis. The Bookwalter retractor was removed from the patient's abdomen.     The fascia was identified and grasped using Kocher clamps. The fascia was closed using a 1-0 looped PDS suture x 2 in a running fashion. It was noted to be completely intact upon closure. The subcutaneous tissue was irrigated and hemostasis was achieved with the Bovie. The subcutaneous tissue was reapproximated using interrupted 3-0 Vicryl sutures. The skin was reapproximated using Quill.     All sponge, lap, needle, and instrument counts were correct x 2. I was present and scrubbed throughout the entire procedure. The patient was successfully extubated and taken to the recovery room in stable condition.     Ulanda Marylynn Sharps, DO  06/05/2023  10:27 AM

## 2023-06-05 NOTE — Anesthesia Procedure Notes (Signed)
---------------------------------------------------------------------------------------------------------------------------------------    AIRWAY   GENERAL INFORMATION AND STAFF    Patient location during procedure: OR       Date of Procedure: 06/05/2023 7:44 AM  CONDITION PRIOR TO MANIPULATION     Current Airway/Neck Condition:  Normal        For more airway physical exam details, see Anesthesia PreOp Evaluation  AIRWAY METHOD     Patient Position:  Sniffing    Preoxygenated: yes      Induction: IV and NDMR    Mask Difficulty Assessment:  1 - vent by mask      Mask NMB: 1 - vent by mask      Technique Used for Successful ETT Placement:  Direct laryngoscopy    Devices/Methods Used in Placement:  Intubating stylet    Blade Type:  Macintosh    Laryngoscope Blade/Video laryngoscope Blade Size:  3    Cormack-Lehane Classification:  Grade IIa - partial view of glottis    Placement Verified by: capnometry, auscultation and equal breath sounds      Number of Attempts at Approach:  1  FINAL AIRWAY DETAILS    Final Airway Type:  Endotracheal airway    Final Endotracheal Airway:  ETT      Cuffed: cuffed    Insertion Site:  Oral    ETT Size (mm):  7.0    Distance inserted from Teeth (cm):  22  ADDITIONAL COMMENTS   Atraumatic.   ----------------------------------------------------------------------------------------------------------------------------------------

## 2023-06-05 NOTE — Plan of Care (Signed)
 Problem: Bowel Elimination  Goal: Elimination patterns are normal or improving  Outcome: Maintaining  Goal: Patient demonstrates care and management of ostomy bag  Outcome: Maintaining     Problem: GI Bleeding Elimination  Goal: Elimination of patterns are normal or improving  Outcome: Maintaining     Problem: Fluid and Electrolyte Imbalance  Goal: Fluid and Electrolyte imbalance  Outcome: Maintaining     Problem: Post-Operative Hemodynamic Stability  Goal: Maintain Hemodynamic Stability  Outcome: Maintaining     Problem: Post-Operative Complications  Goal: Prevent post-operative complications  Outcome: Maintaining  Goal: Patient will remain free from symptoms of infection-post op  Outcome: Maintaining     Problem: Post-Operative Bowel Elimination  Goal: Elimination pattern is normal or improving  Outcome: Maintaining     Problem: Post-Operative Bladder Elimination  Goal: Patient is able to empty bladder or return to baseline  Outcome: Maintaining

## 2023-06-05 NOTE — Anesthesia Procedure Notes (Signed)
----------------------------------------------------------------------------------------------------------------------------------------    Abdominal Wall Nerve Block      Date of Procedure: 06/05/2023 10:36 AM    Laterality:  Right    Injection Technique: Single-shot    Location Details:TAP()    Reason for Block: Post-op pain management and Nerve blocks for postoperative pain management at the surgeon's request. The adequacy of the intraoperative anesthesia is not dependent on the peripheral nerve block.        Patient Location: OR  CONSENT AND TIMEOUT     Consent:  Obtained per policy    Timeout: patient identified (name/DOB) , nerve block procedure/site/side verified against block consent form , proper patient position verified, needed equipment, monitors, medications and access verified as present and functioning , allergies reviewed with patient/record , all members of the block team participated in the timeout and anticoagulation/antiplatelet status reviewed  METHOD     Patient Position:  Supine    Monitoring:  Blood pressure, Continuous pulse ox with supplemental oxygen and EKG    Sedation Used:  Yes    Level of Sedation: General Anesthesia              for meds injected see MAR portion of chart      Sterile Technique:  Hand sanitizing, hat and mask    Prep:  Aseptic technique per protocol and Chloraprep      Approach:  In-plane and Anterior    Technique: Ultrasound guided       Attempts (Skin Punctures):  1   NEEDLE     Type:  Short-bevel     Gauge: 22 G     Length: 8 cm  BLOCK EVENTS      No significant resistance to injection     No blood aspirated     No intravascular injection     Well Tolerated  ----------------------------------------------------------------------------------------------------------------------------------------

## 2023-06-05 NOTE — Anesthesia Case Conclusion (Signed)
 CASE CONCLUSION  Emergence  Actions:  Suctioned, mask supported, OPA and extubated  Criteria Used for Airway Removal:  Adequate Tv & RR and acceptable O2 saturation  Assessment:  Routine  Transport  Directly to: PACU  Position:  Upright  Patient Condition on Handoff  Level of Consciousness:  Mildly sedated  Patient Condition:  Stable  Handoff Report to:  RN

## 2023-06-05 NOTE — Progress Notes (Signed)
 GYNECOLOGIC ONCOLOGY PROGRESS NOTE       Patient: Julie Walsh     Age: 73 y.o.     LOS:  LOS: 0 days     DOS: * Day of Surgery *    Cancer Diagnosis: recurrent granulosa cell tumor     Subjective     Julie Walsh is feeling overall well. Pain is well-controlled with current regimen, states is slightly increases when she drinks water  but otherwise feels well. Current diet is CLD, tolerating without nausea/vomiting. Flatus absent. Has not ambulated. Foley catheter in. Denies fever/chills, chest pain, shortness of breath or additional symptoms.     Objective      Physical Exam:  Vitals:    06/05/23 1245 06/05/23 1300 06/05/23 1349 06/05/23 1548   BP: 105/53 114/61 135/75 123/73   BP Location:  Right arm Left arm Left arm   Pulse: 79 83 75 78   Resp: 11 (!) 9 14 16    Temp:   35.8 C (96.4 F) 36 C (96.8 F)   TempSrc:   Infrared Infrared   SpO2: 95% 97% 97% 94%   Weight:       Height:           Intake/Output:  I/O last 3 completed shifts:  05/05 1500 - 05/06 1459  In: 2868.3 (41.8 mL/kg) [I.V.:2748.3 (1.7 mL/kg/hr); IV Piggyback:120]  Out: 275 (4 mL/kg) [Urine:200 (0.1 mL/kg/hr); Blood:75]  Net: 2593.3  Weight: 68.7 kg      General: Well- appearing, no apparent distress  HEENT: Normocephalic, atraumatic   Respiratory: Nonlabored respirations, breathing comfortably on room air  Cardiovascular: Regular rate  Abdomen: +BS, soft, nondistended, appropriately tender to palpation  Incision(s): Clean, dry bandage covering incision  Neurologic: Alert, appropriate  Extremities: No edema in lower extremities bilaterally, SCDs on       Labs      Recent Labs   Lab 06/04/23  0953   WBC 5.9   Hemoglobin 12.8   Hematocrit 41   Platelets 217     Recent Labs   Lab 06/04/23  0953   Sodium 141   Potassium 4.2   Chloride 103   CO2 24   UN 16   Creatinine 1.17*   Glucose 96   Calcium  9.2         Medical Conditions      DVT - eliquis  5 mg BID - held POD0  HLD - atorvastatin  40 mg daily  Trigeminal neuralgia/sciatica  - gabapentin  300 qhs  Anxiety/depression - trazodone  100 mg qhs        Assessment & Plan      ASSESSMENT  Julie Walsh is a 73 y.o. s/p ex lap, de-bulk, small bowel resection and primary reanastomosis on 5/6. Pt is doing well post operatively and progressing towards milestones.    Post op  - EBL 75 cc  - Pain: tylenol , oxycodone  prn    F: LR 125 cc/hr x 10 hr  E: Daily  N: CLD -> Transitional in AM  Lines: PIV, foley  Antimicrobials: Ancef /Flagyl  intra-op  VTE ppx: SCDs, Anticoagulation in the AM - needs order  Bowel reg/GI ppx: senna    Dispo: pending meeting post op milestones, anticipate POD2-3    Julie Sink, MD PGY3  Obstetric and Gynecology  Pager #: [phone]

## 2023-06-05 NOTE — Op Note (Signed)
 [facility]  Huey French, M.D.     Ronal Dene Duster, M.D.     Steven Ognibene, M.D.       Dick Kurk, M.D.     Will Holly, M.D.       Redell Councilman, M.D.     Belvie Eck, M.D.     Beverley Africa, M.D.        OPERATIVE REPORT        PATIENT:   Julie Walsh, Julie Walsh MR #:  [id number]   CSN:  [id number] DOB:  April 07, 1950    AGE:  73     SURGEON:  Redell Councilman, MD  ASSISTANT:  Jon Flatten, MD, resident.  SURGERY DATE:  06/05/2023    PREOPERATIVE DIAGNOSIS:  Recurrent ovarian cancer.    POSTOPERATIVE DIAGNOSIS:  Recurrent ovarian cancer.    OPERATIVE PROCEDURE:  Exploratory laparotomy, lysis of adhesion, removal of tumor implants off the bowel and small bowel resection.    ANESTHESIA:  General  ANESTHESIOLOTIST:  Frederic Dallas Rife, D.O.    GYNECOLOGY-ONCOLOGY TEAM:  Rosina Sharps, D.O., Attending   Leita Sink, MD, Resident   Kennard Mana, MD, Fellow.    STAFF IN THE ROOM:  Adrienne Melissa Bacilli, RN, Preceptor Circulator   Rosina Lights, RN, orientee Circulator  Richerd Dawn, scrub tech.    SPECIMEN:  The primary specimen for my portion of the small bowel with tumor.  There were other specimens that will be dictated by Dr. Sharps.    COMPLICATIONS:  None.    FINDINGS:  Diffuse abdominal adhesions but likely loosely areolar in nature.  Multiple small implants, large masses in the left upper quadrant small bowel as well as in the right upper quadrant.    INDICATION FOR PROCEDURE:  This is a 73 year old female, history of ovarian cancer, here with recurrence and new lesions, particularly in the small bowel.  A colorectal consult was obtained due to the potential need for a bowel resection.  Risks, benefits and alternatives of procedure were discussed with the patient, including but not limited to bleeding, infection, anastomotic leak, injury to other structures, need for further operation, potential for stoma and the benefit to resect the cancer to clear out these and alternatives of no  procedure.    DESCRIPTION OF PROCEDURE:  Patient identified in the preop holding area by the patient and procedure.  Brought to the operating room and placed in a lithotomy position.  Dr. Sharps will dictate that portion of the procedure as she got entry to the abdomen.  Once a midline laparotomy was performed.  This one I helped scrub in, and there were loose adhesions throughout the abdomen, more so on the right side than on the left.  These were taken down gently with a combination of finger fracture, finger dissection, blunt dissection and sharp dissection with Metzenbaum scissors.  Minimal use of cautery was done to minimize any risk of bowel injury.  There were a couple sections that were questionable if any bowel serosa was potentially near our dissection, so I imbricated these sections with figure-of-eight 3-0 Vicryl sutures.  Some small implants in the small bowel as well that these were taken off.      Eventually, there was a lesion in the right upper quadrant and left upper quadrant that were taken.  Once all that dissection was done and the lysis of adhesion allowed me to run the small bowel from the ligament of Treitz to the ileocolonic anastomosis.  No obvious bowel  injuries/enterotomies/serosal tears noted.    We then turned our attention to the primary target at the small bowel and that small bowel was at the level of the jejunum just beyond the ligament of Treitz.  I skeletonized the mesentery on both sides of this lesion and then used a blue load powered Endo-GIA stapler, 60 mm, to transect across that proximal and distal.  Then, I lined up and took the mesentery underneath this with the LigaSure device.  The specimen was taken off and mesenteric edge appeared hemostatic.  We then prepared for a functional end-to-end side-to-side anastomosis, grasping the antimesenteric borders with Allis clamps and cutting off the small bowel.  And once we were in the lumen, we passed the linear GIA stapler in,  lining up the antimesenteric sides of the small bowel and again with a blue load 60 mm.  This was then fired across to create a new common channel.  This was inspected and noted to be hemostatic and intact.  The common channel opening was then brought back together with Allis clamps, bringing serosa to serosa.  The TA 60 was brought across this and fired across that.  The top portion of the tissue on top of this was cut off with a Mayo scissors and then the stapler was released.  The staple line was then reinforced with Lembert suture and 3-0 Vicryl until the staple line was covered.  Then, I palpated the lumen and felt to be patent, externally.  The mesenteric defect underneath this was then reapproximated using a 3-0 Vicryl suture.  There was 1 area of some mild bleeding, and I placed the Surgicel on top of this and imbricated there to achieve a hemostatic repair and closure.      Then, the abdomen was then irrigated out, swapped out gloves for a clean closure, and placed Seprafilm on top of the bowel except for the area of the anastomosis as well as down to the pelvis to help decrease the adhesions and then I turned the case back over to Dr. [name] and she will dictate the rest of her portion of the procedure.  I then updated the patient's husband regarding the events of the operation.  All questions were answered to his apparent satisfaction.  All needles and sponge counts were correct at the end the procedure x2.    ______________________________  Redell Councilman, MD    BT/MODL  DD:  06/05/2023 10:18:01  DT:  06/05/2023 13:18:57  Job #:  [id number]

## 2023-06-05 NOTE — H&P (Signed)
 UPDATES TO PATIENT'S CONDITION on the DAY OF SURGERY/PROCEDURE    I. Updates to Patient's Condition (to be completed by a provider privileged to complete a H&P, following reassessment of the patient by the provider):    Day of Surgery/Procedure Update:  History  History reviewed and no change    Physical  Physical exam updated and no change            II. Procedure Readiness   I have reviewed the patient's H&P and updated condition. By completing and signing this form, I attest that this patient is ready for surgery/procedure.    III. Attestation   I have reviewed the updated information regarding the patient's condition and it is appropriate to proceed with the planned surgery/procedure.      Redell Councilman, MD as of 7:29 AM 06/05/2023

## 2023-06-05 NOTE — Anesthesia Procedure Notes (Signed)
----------------------------------------------------------------------------------------------------------------------------------------    Abdominal Wall Nerve Block      Date of Procedure: 06/05/2023 10:38 AM    Laterality:  Left    Injection Technique: Single-shot    Location Details:TAP()    Reason for Block: Post-op pain management and Nerve blocks for postoperative pain management at the surgeon's request. The adequacy of the intraoperative anesthesia is not dependent on the peripheral nerve block.        Patient Location: OR  CONSENT AND TIMEOUT     Consent:  Obtained per policy    Timeout: patient identified (name/DOB) , nerve block procedure/site/side verified against block consent form , proper patient position verified, needed equipment, monitors, medications and access verified as present and functioning , allergies reviewed with patient/record , all members of the block team participated in the timeout and anticoagulation/antiplatelet status reviewed  METHOD     Patient Position:  Supine    Monitoring:  Blood pressure, Continuous pulse ox with supplemental oxygen and EKG    Sedation Used:  Yes    Level of Sedation: General Anesthesia              for meds injected see MAR portion of chart      Sterile Technique:  Hand sanitizing, mask and hat    Prep:  Aseptic technique per protocol and Chloraprep      Approach:  In-plane and Anterior    Technique: Ultrasound guided       Attempts (Skin Punctures):  1   NEEDLE     Type:  Short-bevel     Gauge: 22 G     Length: 8 cm  BLOCK EVENTS      No significant resistance to injection     No blood aspirated     No intravascular injection     Well Tolerated  ----------------------------------------------------------------------------------------------------------------------------------------

## 2023-06-05 NOTE — Progress Notes (Signed)
 [id number]    Dictation: [id number]

## 2023-06-05 NOTE — Anesthesia Preprocedure Evaluation (Signed)
 Anesthesia Pre-operative History and Physical for Julie Walsh    Highlighted Issues for this Procedure:  73 y.o. female with Pelvic mass [R19.00] presenting for Procedure(s) (LRB):  LAPAROTOMY, EXPLORATORY RESECTION OF PELVIC MASS (N/A)  EXCISION, SMALL INTESTINE (N/A) by Surgeon(s):  Julie Rogue, MD  Julie Ulanda Caffey, DO scheduled for 165 minutes.  BMI Readings from Last 1 Encounters:  06/05/23 : 29.57 kg/m            .  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 Prior cervical spine injury.)  Pertinent(-):  No seizures or cerebrovascular event    ENDO/OTHER  Pertinent(-):  No diabetes mellitus, thyroid  disease             Physical Exam    Airway            Mouth opening: limited            Mallampati: III            TM distance (fb): >3 FB            Neck ROM: limited  Dental   Normal Exam   Cardiovascular  Normal Exam         Pulmonary   Normal Exam             ________________________________________________________________________  PLAN  ASA Score  3  Anesthetic Plan general       Induction (routine IV) General Anesthesia/Sedation Maintenance Plan (inhaled agents);  Airway Manipulation (direct laryngoscopy); Airway (cuffed ETT); Line ( use current access); Monitoring (standard ASA); Positioning (supine); PONV Plan (dexamethasone  and ondansetron ); Pain (per surgical team and  nerve block); PostOp (PACU)Standard Attestation    Informed Consent     Risks:          Risks discussed were commensurate with the plan listed above with the following specific points: N/V, aspiration, fatigue, sore throat, headache, failed block, infection, dizziness and unsteadiness, Damage to: eyes, teeth, blood vessels and nerves, allergic Rx and unexpected serious injury.    Anesthetic Consent:         Anesthetic plan (and risks as noted above) were discussed with patient    Blood products Consent:        Use of blood products discussed with: patient     Responsible Anesthesia Provider Attestation:  I attest that the patient or proxy understands and accepts the risks and benefits of the anesthesia plan. I also attest that I have personally performed a pre-anesthetic examination and evaluation, and prescribed the anesthetic plan for this particular  location within 48 hours prior to the anesthetic as documented. Julie Dallas Rife, DO  06/05/23, 7:23 AM

## 2023-06-05 NOTE — Preop H&P (Signed)
 UPDATES TO PATIENT'S CONDITION on the DAY OF SURGERY/PROCEDURE    I. Updates to Patient's Condition (to be completed by a provider privileged to complete a H&P, following reassessment of the patient by the provider):    Day of Surgery/Procedure Update:  History  History reviewed and no change    Physical  Physical exam updated and no change    II. Procedure Readiness   I have reviewed the patient's H&P and updated condition. By completing and signing this form, I attest that this patient is ready for surgery/procedure.    III. Attestation   I have reviewed the updated information regarding the patient's condition and it is appropriate to proceed with the planned surgery/procedure.    Details of the planned procedure are re-explained including bowel surgery, bowel injury, ostomy, aborting the surgical removal, etc.     Ulanda Marylynn Sharps, DO as of 6:54 AM 06/05/2023

## 2023-06-05 NOTE — Telephone Encounter (Signed)
 Pa not req with active Medicare.        It is the patients' responsibility to verify that CPT code(s) that do not require prior auth are deemed medically necessary per their policy.

## 2023-06-06 ENCOUNTER — Encounter: Payer: Self-pay | Admitting: Obstetrics & Gynecology

## 2023-06-06 LAB — CBC AND DIFFERENTIAL
Baso # K/uL: 0 10*3/uL (ref 0.0–0.2)
Eos # K/uL: 0.1 10*3/uL (ref 0.0–0.5)
Hematocrit: 35 % (ref 34–49)
Hemoglobin: 11.1 g/dL — ABNORMAL LOW (ref 11.2–16.0)
IMM Granulocytes #: 0 10*3/uL (ref 0.0–0.0)
IMM Granulocytes: 0.4 %
Lymph # K/uL: 1.7 10*3/uL (ref 1.0–5.0)
MCV: 97 fL (ref 75–100)
Mono # K/uL: 0.8 10*3/uL (ref 0.1–1.0)
Neut # K/uL: 5.5 10*3/uL (ref 1.5–6.5)
Nucl RBC # K/uL: 0 10*3/uL (ref 0.0–0.0)
Nucl RBC %: 0 /100{WBCs} (ref 0.0–0.2)
Platelets: 175 10*3/uL (ref 150–450)
RBC: 3.6 MIL/uL — ABNORMAL LOW (ref 4.0–5.5)
RDW: 13.5 % (ref 0.0–15.0)
Seg Neut %: 68 %
WBC: 8.1 10*3/uL (ref 3.5–11.0)

## 2023-06-06 LAB — BASIC METABOLIC PANEL
Anion Gap: 8 (ref 7–16)
CO2: 27 mmol/L (ref 20–28)
Calcium: 8.3 mg/dL — ABNORMAL LOW (ref 8.6–10.2)
Chloride: 104 mmol/L (ref 96–108)
Creatinine: 0.93 mg/dL (ref 0.51–0.95)
Glucose: 142 mg/dL — ABNORMAL HIGH (ref 60–99)
Lab: 11 mg/dL (ref 6–20)
Potassium: 3.9 mmol/L (ref 3.3–5.1)
Sodium: 139 mmol/L (ref 133–145)
eGFR BY CREAT: 64 *

## 2023-06-06 LAB — MAGNESIUM: Magnesium: 2.1 mg/dL (ref 1.6–2.5)

## 2023-06-06 MED ORDER — APIXABAN 5 MG PO TABS *I*
5.0000 mg | ORAL_TABLET | Freq: Two times a day (BID) | ORAL | Status: DC
Start: 2023-06-06 — End: 2023-06-08
  Administered 2023-06-06 – 2023-06-08 (×5): 5 mg via ORAL
  Filled 2023-06-06 (×5): qty 1

## 2023-06-06 MED ORDER — MAGNESIUM SULFATE 2 GM IN 50 ML *WRAPPED*
2000.0000 mg | Freq: Once | INTRAVENOUS | Status: AC
Start: 2023-06-06 — End: 2023-06-06
  Administered 2023-06-06: 2000 mg via INTRAVENOUS
  Filled 2023-06-06: qty 50

## 2023-06-06 NOTE — Progress Notes (Signed)
 06/06/23 1044   [facility] Patient Class Review   Patient Class Review Inpatient     Patient Class effective 06/05/23     Jaime Mayans, BSN, RN    [facility] - Utilization Management   [address]   [place], [place]  [address]  Office: [phone]

## 2023-06-06 NOTE — Plan of Care (Signed)
Problem: Bowel Elimination  Goal: Elimination patterns are normal or improving  Outcome: Progressing towards goal  Goal: Patient demonstrates care and management of ostomy bag  Outcome: Progressing towards goal     Problem: GI Bleeding Elimination  Goal: Elimination of patterns are normal or improving  Outcome: Progressing towards goal     Problem: Fluid and Electrolyte Imbalance  Goal: Fluid and Electrolyte imbalance  Outcome: Progressing towards goal     Problem: Post-Operative Hemodynamic Stability  Goal: Maintain Hemodynamic Stability  Outcome: Progressing towards goal     Problem: Post-Operative Complications  Goal: Prevent post-operative complications  Outcome: Progressing towards goal  Goal: Patient will remain free from symptoms of infection-post op  Outcome: Progressing towards goal     Problem: Post-Operative Bowel Elimination  Goal: Elimination pattern is normal or improving  Outcome: Progressing towards goal

## 2023-06-06 NOTE — Plan of Care (Signed)
 Informed by SW that patient may want a raised toilet seat for home.  RTS are not covered by insurance and out of pocket expense is $35.      Darien Eden RN, MSN, RN-BC  [facility] Coordinator  Available by [employer]  2:15 PM

## 2023-06-06 NOTE — Comprehensive Assessment (Signed)
 Personal Contact/Support Systems - 06/06/23 1305          Contact Information    Spokesperson Name Julie Walsh     Relationship to Patient  Spouse     Phone Number(s) [phone]     Has Spokesperson been notified of admission? Yes     Method of Notification In Person        Discharge transportation    Ride to/from facility Julie Walsh                    Adult Social Work Assessment - 06/06/23 1306          Demographics    County of Residence [place]     Marital status Married     Ethnicity/Race Caucasian     Primary Language English     Do you have animals or pets at home? Yes     Type of Animals or Pets 1 dog- 6 lb Yorkshire Terrier        Risk Factors    Risk Factors Age related issues;Adjustment to Dx/Injury/Illness        Health Care Directives Screen (Also required for Hospice Patients)    *Has patient (or family) completed any of the following? (select all that apply) Health Care Proxy (HCP)     HCP Name Julie Walsh     HCP Phone Number [phone]     HCP available for inclusion in the chart? No        Living Situation    Living Situation Home     Lives With Spouse     Can they assist patient after discharge? Yes        Home Geography    Type of [facility]     # Of Steps In Home 0     # Steps to Enter Home 1     Bedroom First floor     Bathroom First floor - full     Utilitites Working Yes        Palliative Care Assessment    Person assessed Patient;Family   Julie Walsh at bedside    Coping Status Appropriate to Stressor     Current Goal of Care Curative        Baseline ADL functioning    Transfers Independent     Ambulation Independent     Assistive Device none     Bathing/Grooming Independent     Meal Prep Independent     Able to feed self? Yes     Household maintenance/chores Independent     Able to drive? Yes        Dialysis    Does Patient have Dialysis? No        Income Information    Vocational Retired     Nurse, [employer] and has        Home Care Services    Do you currently have home  care services? No     Current Home Paediatric Nurse (straight);Walker (rollator)        SW Home oxygen    Do you have home oxygen? No        What Matters    What is/will be most important to you during your hospital stay? Comfort and everyone has been so gracious here     What matters most to you when you leave the hospital? Sleeping in my own bed!  Above assessment completed with patient and her spouse Julie at bedside. They reside in a one story home. Pt is independent with mobility and IADLs/ADLs. She uses a cane or walker as needed. Spouse assists with cleaning the dishes/laundry in the basement. Pt cooks. Pt drives. Julie will provide transportation at discharge.     Patient requested a RTS at discharge. RNCC notified. SW will continue to follow.     Julie Walsh, MSW  SE5/[employer] Social Worker  06/06/23  1:15 PM

## 2023-06-06 NOTE — Progress Notes (Signed)
 [facility]  Ronal Dene Duster, M.D.   Huey French, M.D.   Steven Ognibene, M.D. Dick Kurk, M.D.     Will Holly, M.D.   Redell Councilman, M.D.  Belvie Eck, MD   Beverley Africa, MD        Colorectal Surgery Progress Note      Subjective / Interval Events:   NAE, VSS on RA  Feeling well since surgery, pain is present but controlled  Tolerating liquids without n/v  Has not yet ambulated  No flatus or BM yet    Objective:   Patient Vitals for the past 8 hrs:   BP Temp Temp src Pulse Resp SpO2   06/06/23 0303 129/71 36.6 C (97.9 F) Tympanic 97 16 92 %   06/06/23 0000 141/75 36.8 C (98.2 F) Tympanic 97 16 92 %     Physical Exam:   Gen: No acute distress  CV: RRR  Lungs: No dyspnea  Abd: Moderately firm, non-distended, moderately tender to palpation with midline incision dressing clean and dry    (see end of note  for additional objective data)    Assessment / Plan:   Ms. Allmendinger is a 73 y.o. 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 S/p EBRT to pelvic lesion now s/p ex lap with LOA and removal of small bowel implants with SBR 06/05/23 with Dr. [name] and Dr. Councilman. Doing well post-operatively.     --Transitional diet  -- Await return of bowel function  -- Remainder of care per GYN team    Signed:   Jon Flatten MD, PGY2 General Surgery      [facility]  [address], 3rd floor  [place], [place] [address]     [phone]   Fax:[phone]      Temp (24hrs), Avg:36.3 C (97.4 F), Min:35.8 C (96.4 F), Max:36.8 C (98.2 F)    Patient Vitals for the past 24 hrs:   BP Temp Temp src Pulse Resp SpO2 Height Weight   06/06/23 0303 129/71 36.6 C (97.9 F) Tympanic 97 16 92 % -- --   06/06/23 0000 141/75 36.8 C (98.2 F) Tympanic 97 16 92 % -- --   06/05/23 1847 136/65 36.3 C (97.3 F) Infrared 85 16 96 % -- --   06/05/23 1548 123/73 36 C  (96.8 F) Infrared 78 16 94 % -- --   06/05/23 1349 135/75 35.8 C (96.4 F) Infrared 75 14 97 % -- --   06/05/23 1300 114/61 -- -- 83 (!) 9 97 % -- --   06/05/23 1245 105/53 -- -- 79 11 95 % -- --   06/05/23 1230 94/58 -- -- 81 12 95 % -- --   06/05/23 1215 95/54 -- -- 79 12 94 % -- --   06/05/23 1200 97/55 -- -- 77 13 95 % -- --   06/05/23 1145 107/56 36.4 C (97.5 F) TEMPORAL 79 13 94 % -- --   06/05/23 1130 117/57 -- -- 83 14 95 % -- --   06/05/23 1115 109/60 -- -- 82 14 94 % -- --   06/05/23 1105 -- -- -- 83 14 94 % -- --   06/05/23 1100 109/55 -- -- 83 15 90 % -- --   06/05/23 1050 113/56 -- -- 88 17 94 % -- --   06/05/23 1047 127/58 36.3 C (97.3 F)  TEMPORAL 91 18 93 % -- --   06/05/23 0653 120/67 36.6 C (97.9 F) Infrared 79 16 98 % 1.524 m (5') 68.7 kg (151 lb 6.4 oz)     I/O last 3 completed shifts:  05/05 2300 - 05/06 2259  In: 3805.4 (55.4 mL/kg) [I.V.:3685.4 (2.2 mL/kg/hr); IV Piggyback:120]  Out: 775 (11.3 mL/kg) [Urine:700 (0.4 mL/kg/hr); Blood:75]  Net: 3030.4  Weight: 68.7 kg   I/O this shift:  05/06 2300 - 05/07 0659  In: 258.3 (3.8 mL/kg) [I.V.:158.3; IV Piggyback:100]  Out: 525 (7.6 mL/kg) [Urine:525]  Net: -266.7  Weight: 68.7 kg     Medications:  Current Facility-Administered Medications   Medication Dose Route Frequency    atorvastatin  (LIPITOR) tablet 40 mg  40 mg Oral Daily    famotidine  (PEPCID ) tablet 20 mg  20 mg Oral Nightly    gabapentin  (NEURONTIN ) capsule 300 mg  300 mg Oral Nightly    gabapentin  (NEURONTIN ) capsule 200 mg  200 mg Oral Daily    traZODone  (DESYREL ) tablet 100 mg  100 mg Oral Nightly    acetaminophen  (TYLENOL ) tablet 650 mg  650 mg Oral Q6H    oxyCODONE  (ROXICODONE ) immediate release tablet 5 mg  5 mg Oral Q4H PRN    senna (SENOKOT) tablet 2 tablet  2 tablet Oral Nightly    polyethylene glycol (GLYCOLAX ,MIRALAX ) powder 17 g  17 g Oral Daily    ondansetron  (ZOFRAN ) injection 4 mg  4 mg Intravenous Q4H PRN    haloperidol  lactate (HALDOL ) injection 0.5 mg  0.5 mg  Intravenous Q3H PRN    glycerin  adult 1 suppository  1 suppository Rectal Once           Labs/Imaging:   Lab Results   Component Value Date    WBC 9.6 06/05/2023    HGB 12.4 06/05/2023    HCT 39 06/05/2023    MCV 94 06/05/2023    RDW 13.2 06/05/2023    PLT 204 06/05/2023       Lab Results   Component Value Date    CREAT 0.94 06/05/2023    NA 137 06/05/2023    K 4.1 06/05/2023    CL 104 06/05/2023    CO2 23 06/05/2023    ALT 22 05/03/2023    AST 24 05/03/2023    ALB 3.9 05/03/2023    CA 9.0 06/05/2023       No results found for: FIO2, PH, O2SAT

## 2023-06-06 NOTE — Discharge Instructions (Addendum)
 DISCHARGE INSTRUCTIONS    Brief Hospital Course: You were admitted on 5/6 for abdominal mass removal in setting of recurrent granulosa cell tumor. You underwent a diagnostic laparotomy, masses removal, and small bowel resection with primary re-anastomosis. Your post-op course was relatively uncomplicated and you were discharged home in good condition    Primary Diagnosis: recurrent granulosa cell tumor    Surgical Pathology Pending    Activity after surgery   Surgery and anesthesia are a great body stress. Even 1-2 months after surgery you may feel tired. For the first week, restrict your activities to home and limit your trips up and down the stairs. You may be a passenger in a car, but may not drive for 3-4 weeks. You can not drive until you are no longer taking narcotic pain medication.  - You can resume normal activities gradually increase walking as tolerated.  - Shower: You may shower   - Driving: you may drive when okay per your doctor   - Exercise: Do not resume vigorous exercise for 6 weeks. Do not lift heavy objects or children until okay per your doctor, after your follow-up appointment.   - Intercourse: Nothing in the vagina (no tampons, toys, douching or intercourse) for 8 weeks weeks, or until okay per your doctor after your follow-up appointment.   - No lifting more than 5 pounds  - You may not drive if you are taking narcotic pain medication  - Please shower daily, no tub baths for 8 weeks  - Stairs with assistance    Discomfort after surgery:   - It is normal to have mild to moderate abdominal cramping or gas pains for a few days to a few weeks   - It is normal to have mild pain along your incisions   - It is normal to have shoulder or neck pain (caused by gas in your abdomen)   - Ambulating or a heating pad set on low may help relieve gas pains   - Use prescribed medications for pain     Surgery site care after surgery   You may go home with an incision on your abdomen that is covered in a special  glue. Do not pick the glue off, it will eventually fall off after a few weeks as the incision heals. You can shower as usual, patting the incisions dry.     You may go home with staples. These generally stay in got 10-14 days after your surgery and will be removed at your post operative appointment. You may shower with your staples in, just pat the incision dry.     Your incision will take time to heal. It may be sensitive or firm to touch. It may also feel numb around the incision for an indefinite period of time. A few weeks after surgery, you may rub Vitamin E oil into the incision to help soften it and decrease discoloration.    There is an incision inside your vagina with stitches that is healing. As the incision in your vagina heals, you may notice a pink tinged vaginal drainage. The discharge may continue for up to 8 weeks. It if possible, as the suture material dissolves, some pieces may come out through the vagina. This is normal. You may not have intercourse or use tampons or douche for 6-8 weeks after your surgery.     Diet after surgery   Because of the extensive surgery in the abdominal area, your bowels are very slow to return to their  usual state of working. Eat easily digestible foods, stay away from high fiber foods (whole grains, bran, fresh vegetables) and gassy foods (beans, onions, broccoli) for 4-6 weeks. Be prepared for some constipation over the next few weeks. You may use stool softeners (Colace) or laxatives (Miralax , Milk of Magnesia, Senna) if necessary. Stool softeners are recommended twice daily if you are taking narcotic pain medications. Increasing fluids and walking will also help your bowels return to normal.     Medications: You may be given a prescription for the pain for the first few weeks. After that, unless you are allergic, try Advil or Motrin, or Extra-Strength Tylenol  for discomfort. Do not mix alcohol with pain medication. If you are given an antibiotic, be sure to finish  all the medication.     You have a post operative appointment listed on this paperwork. Please make sure you attend this appointment. If this time if not convenient for you and your family, please call to reschedule.     Call Dr. Felipe Horton at [phone] for the following problems:  - Chest Pain  - Shortness of Breath  - Nausea/Vomiting/Diarrhea  - Incision Opens  - Constipation unrelieved by medications  - Shaking or chills  - Pain that your pain medication does not control  - Severe cramping or abdominal pain not relieved by pain medications   - Temperature greater than 101  - Lightheadedness, dizziness, or fainting.   - Sudden onset of shortness of breath or wheezing   - Nausea and vomiting   - Inability to urinate   - Foul smelling discharge   - Heavy vaginal bleeding (soaking through 1 pad every 1-2 hours)   - Swollen or tender lower leg  - With any other concerns or questions   - If you have an emergency and are unable to contact your doctor, you should come in to be seen at the emergency department.      Also,  A) DO NOT drive or operate any machinery until completely off narcotics   B) DO NOT drink alcoholic beverages while taking narcotic pain medications.   C) DO NOT make major decisions, sign contracts, etc for 24 hours   Please continue to adhere to these precautions if you are taking narcotic medication.     We hope that your stay on [place] [address] at [place] was enjoyable. If you have any questions or concerns please do not hesitate to bring them up to your nurse or medical team.

## 2023-06-06 NOTE — Anesthesia Postprocedure Evaluation (Signed)
 Anesthesia Post-Op Note    Patient: Julie Walsh    Procedure(s) Performed:  Procedure Summary  Date:  06/05/2023 Anesthesia Start: 06/05/2023  7:28 AM Anesthesia Stop: 06/05/2023 10:52 AM Room / Location:  H_OR_12 / [facility] MAIN OR   Procedure(s):  EXPLORATORY LAPAROTOMY RADICAL RESECTION FOR OVARIAN MALIGNANCY; LYSIS OF ADHESIONS; REMOVAL OF SMALL BOWEL IMPLANTS  EXPLORATORY LAPAROTOMY; LYSIS OF ADHESIONS; REMOVAL OF TUMOR IMPLANTS; SMALL BOWEL RESECTION  OMENTECTOMY Diagnosis:  Pelvic mass [R19.00] Surgeon(s):  Cesario Slough, MD  Cesario Slough, MD  Cesario Slough, MD  Candia Rogue, MD  [name] Ulanda Caffey, DO  Effie Doffing, MD  Zhang, Naixin, MD Responsible Anesthesia Provider:  RICE FREDERIC LOVING, DO         Recovery Vitals  BP: 105/53 (06/06/2023 10:35 AM)  Heart Rate: 80 (06/06/2023 10:35 AM)  Heart Rate (via Pulse Ox): 83 (06/05/2023  1:00 PM)  Resp: 16 (06/06/2023 10:35 AM)  Temp: 36.5 C (97.7 F) (06/06/2023 10:35 AM)  SpO2: 95 % (06/06/2023 10:35 AM)  FiO2: 0 % (06/06/2023  3:03 AM)  O2 Flow Rate: 0 L/min (06/06/2023  3:03 AM)   0-10 Scale: 0 (06/06/2023  3:55 AM)    Anesthesia type:  general  Complications Noted During Procedure or in PACU:  None   Comment:      Oxygen Saturation:    Within patient's normal range  Cardiac Status:   within patient's normal range  Post Op Assessment:    Tolerated procedure well  Responsible Anesthesia Provider Attestation:  All indicated post anesthesia care provided     -

## 2023-06-06 NOTE — Progress Notes (Addendum)
 GYNECOLOGIC ONCOLOGY PROGRESS NOTE       Patient: Julie Walsh     Age: 73 y.o.     LOS:  LOS: 1 day     DOS: 1 Day Post-Op    Cancer Diagnosis: recurrent granulosa cell tumor     Subjective     [name] feeling overall well. Pain is well-controlled with current regimen. Current diet is CLD, tolerating without nausea/vomiting. Flatus absent, but hearing bowel sounds. Has not ambulated. Foley catheter in will plan for removal this AM. Denies fever/chills, chest pain, shortness of breath or additional symptoms.     Objective      Physical Exam:  Vitals:    06/05/23 1548 06/05/23 1847 06/06/23 0000 06/06/23 0303   BP: 123/73 136/65 141/75 129/71   BP Location: Left arm Left arm     Pulse: 78 85 97 97   Resp: 16 16 16 16    Temp: 36 C (96.8 F) 36.3 C (97.3 F) 36.8 C (98.2 F) 36.6 C (97.9 F)   TempSrc: Infrared Infrared Tympanic Tympanic   SpO2: 94% 96% 92% 92%   Weight:       Height:           Intake/Output:  I/O last 3 completed shifts:  05/05 2300 - 05/06 2259  In: 3805.4 (55.4 mL/kg) [I.V.:3685.4 (2.2 mL/kg/hr); IV Piggyback:120]  Out: 775 (11.3 mL/kg) [Urine:700 (0.4 mL/kg/hr); Blood:75]  Net: 3030.4  Weight: 68.7 kg      General: Well- appearing, no apparent distress  HEENT: Normocephalic, atraumatic   Respiratory: Nonlabored respirations, breathing comfortably on room air  Cardiovascular: Regular rate  Abdomen: +BS, soft, nondistended, appropriately tender to palpation  Incision(s): Clean, dry bandage covering incision  Neurologic: Alert, appropriate  Extremities: No edema in lower extremities bilaterally, SCDs on       Labs      Recent Labs   Lab 06/05/23  2130 06/04/23  0953   WBC 9.6 5.9   Hemoglobin 12.4 12.8   Hematocrit 39 41   Platelets 204 217     Recent Labs   Lab 06/05/23  2130 06/04/23  0953   Sodium 137 141   Potassium 4.1 4.2   Chloride 104 103   CO2 23 24   UN 13 16   Creatinine 0.94 1.17*   Glucose 159* 96   Calcium  9.0 9.2   Magnesium  1.6  --          Medical  Conditions      DVT - eliquis  5 mg BID - held POD0  HLD - atorvastatin  40 mg daily  Trigeminal neuralgia/sciatica - gabapentin  300 qhs  Anxiety/depression - trazodone  100 mg qhs        Assessment & Plan      ASSESSMENT  [name] a 73 y.o. s/p ex lap, de-bulk, small bowel resection and primary reanastomosis on 5/6. Pt is doing well post operatively and progressing towards milestones. Discussed with patient plan for removal of foley catheter this AM, progress diet to transitional diet, and attempt ambulation as goals today. Patient is amenable to plan.    Post op  - EBL 75 cc  - Pain: tylenol , oxycodone  prn  - H/H 12.8/41 > 12.4/39  - glycerin  suppository order for this AM    F: SLIV  E: Daily  N: CLD -> Transitional in AM  Lines: PIV, foley  Antimicrobials: Ancef /Flagyl  intra-op  VTE ppx: SCDs, Anticoagulation in the AM - needs order  Bowel reg/GI ppx: senna  Dispo: pending meeting post op milestones, anticipate POD2-3    Leita Sink, MD PGY3  Obstetric and Gynecology  Pager #: [phone]

## 2023-06-07 LAB — BASIC METABOLIC PANEL
Anion Gap: 8 (ref 7–16)
CO2: 29 mmol/L — ABNORMAL HIGH (ref 20–28)
Calcium: 8.2 mg/dL — ABNORMAL LOW (ref 8.6–10.2)
Chloride: 103 mmol/L (ref 96–108)
Creatinine: 0.96 mg/dL — ABNORMAL HIGH (ref 0.51–0.95)
Glucose: 128 mg/dL — ABNORMAL HIGH (ref 60–99)
Lab: 10 mg/dL (ref 6–20)
Potassium: 3.6 mmol/L (ref 3.3–5.1)
Sodium: 140 mmol/L (ref 133–145)
eGFR BY CREAT: 62 *

## 2023-06-07 LAB — CBC AND DIFFERENTIAL
Baso # K/uL: 0 10*3/uL (ref 0.0–0.2)
Eos # K/uL: 0.2 10*3/uL (ref 0.0–0.5)
Hematocrit: 31 % — ABNORMAL LOW (ref 34–49)
Hemoglobin: 10.1 g/dL — ABNORMAL LOW (ref 11.2–16.0)
IMM Granulocytes #: 0 10*3/uL (ref 0.0–0.0)
IMM Granulocytes: 0.3 %
Lymph # K/uL: 1.3 10*3/uL (ref 1.0–5.0)
MCV: 94 fL (ref 75–100)
Mono # K/uL: 0.6 10*3/uL (ref 0.1–1.0)
Neut # K/uL: 4.7 10*3/uL (ref 1.5–6.5)
Nucl RBC # K/uL: 0 10*3/uL (ref 0.0–0.0)
Nucl RBC %: 0 /100{WBCs} (ref 0.0–0.2)
Platelets: 181 10*3/uL (ref 150–450)
RBC: 3.3 MIL/uL — ABNORMAL LOW (ref 4.0–5.5)
RDW: 13.4 % (ref 0.0–15.0)
Seg Neut %: 68.6 %
WBC: 6.8 10*3/uL (ref 3.5–11.0)

## 2023-06-07 LAB — MAGNESIUM: Magnesium: 2 mg/dL (ref 1.6–2.5)

## 2023-06-07 NOTE — Progress Notes (Signed)
 GYNECOLOGIC ONCOLOGY PROGRESS NOTE       Patient: Julie Walsh     Age: 73 y.o.     LOS:  LOS: 2 days     DOS: 2 Days Post-Op    Cancer Diagnosis: recurrent granulosa cell tumor     Subjective     Julie Walsh is feeling overall well. Pain is well-controlled with current regimen. Current diet is transitional, tolerating without nausea/vomiting. Passed gas overnight, no BM. Has ambulated to bathroom but has not walked farther than that- says nursing told her they would return to help her ambulate down the hallway but didn't come. Denies fever/chills, chest pain, shortness of breath or additional symptoms.     Objective      Physical Exam:  Vitals:    06/06/23 1904 06/06/23 2253 06/07/23 0226 06/07/23 0353   BP: 137/69 139/74 (!) 86/45 96/54   BP Location: Left arm Left arm Right arm Left arm   Pulse: 95 95 98    Resp: 18 18 18     Temp: 36.7 C (98.1 F) 36.7 C (98.1 F) 36.4 C (97.5 F)    TempSrc: Infrared Infrared Infrared    SpO2: 94% 90% 90%    Weight:       Height:         Intake/Output:  I/O last 3 completed shifts:  05/06 2300 - 05/07 2259  In: 738.3 (10.8 mL/kg) [P.O.:480; I.V.:158.3 (0.1 mL/kg/hr); IV Piggyback:100]  Out: 1425 (20.8 mL/kg) [Urine:1425 (0.9 mL/kg/hr)]  Net: -686.7  Weight: 68.7 kg      General: Well- appearing, no apparent distress  HEENT: Normocephalic, atraumatic   Respiratory: Nonlabored respirations, breathing comfortably on room air  Cardiovascular: Regular rate  Abdomen: +BS, soft, nondistended, appropriately tender to palpation  Incision(s): Clean, dry, intact- dressing removed  Neurologic: Alert, appropriate  Extremities: Nontender to palpation bilaterally       Labs      Recent Labs   Lab 06/06/23  2252 06/05/23  2130 06/04/23  0953   WBC 8.1 9.6 5.9   Hemoglobin 11.1* 12.4 12.8   Hematocrit 35 39 41   Platelets 175 204 217     Recent Labs   Lab 06/06/23  1853 06/05/23  2130 06/04/23  0953   Sodium 139 137 141   Potassium 3.9 4.1 4.2   Chloride 104 104 103    CO2 27 23 24    UN 11 13 16    Creatinine 0.93 0.94 1.17*   Glucose 142* 159* 96   Calcium  8.3* 9.0 9.2   Magnesium 2.1 1.6  --          Medical Conditions      DVT - eliquis  5 mg BID - held POD0  HLD - atorvastatin  40 mg daily  Trigeminal neuralgia/sciatica - gabapentin  300 qhs  Anxiety/depression - trazodone  100 mg qhs        Assessment & Plan      ASSESSMENT  Julie Walsh is a 73 y.o. s/p ex lap, de-bulk, small bowel resection and primary reanastomosis on 5/6. Pt is doing well post operatively and progressing towards milestones. Will discuss increased ambulation with nursing today. Transition to low residue diet per CRS. Dressing removed- incision c/d/I.     Post op  - EBL 75 cc  - Pain: tylenol , oxycodone prn  - H/H 12.8/41 > 12.4/39 > 11.1/35    F: SLIV  E: Daily  N: Transitional --> low residue diet  Lines: PIV, foley  Antimicrobials: s/p Ancef/Flagyl intra-op  VTE ppx: SCDs, Eliquis   Bowel reg/GI ppx: senna    Dispo: pending meeting post op milestones, anticipate POD2-3    Alicia Apa, MD  OBGYN PGY-1

## 2023-06-07 NOTE — Progress Notes (Addendum)
 [facility]  Nicola Barre, M.D.   Jinnie Mountain, M.D.   Steven Ognibene, M.D. Jarrell Merritts, M.D.     Sarajane Cumming, M.D.   Peggyann Bower, M.D.  Candice Chalet, MD   Sophie Dutch, MD        Colorectal Surgery Progress Note      Subjective / Interval Events:     No overnight events   Afebrile, VSS on room air   Pain well controlled  Advanced to transitional diet yesterday, tolerating well without issue  Denies nausea/emesis   Passed a small amount of gas yesterday, no BM   Voiding spontaneously     Objective:   Patient Vitals for the past 8 hrs:   BP Temp Temp src Pulse Resp SpO2   06/07/23 0353 96/54 -- -- -- -- --   06/07/23 0226 (!) 86/45 36.4 C (97.5 F) Infrared 98 18 90 %   06/06/23 2253 139/74 36.7 C (98.1 F) Infrared 95 18 90 %     Physical Exam:   Gen: No acute distress  CV: RRR  Lungs: No dyspnea  Abd: Soft, non-distended, moderately tender to palpation with midline incision dressing clean and dry    (see end of note  for additional objective data)    Assessment / Plan:   Ms. Waterhouse is a 73 y.o. 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 S/p EBRT to pelvic lesion now s/p ex lap with LOA and removal of small bowel implants with SBR 06/05/23 with Dr. Felipe Horton and Dr. Hassel Lins. Doing well post-operatively.     -- Recommend advancing to Low Residue Diet   -- Await return of bowel function  -- Remainder of care per GYN team    Signed:   Celeste Cola MD, PGY1 General Surgery      [facility]  [address] 3rd floor  [place], [place] [address]     [phone]   Fax:[phone]      Temp (24hrs), Avg:36.4 C (97.6 F), Min:36 C (96.8 F), Max:36.7 C (98.1 F)    Patient Vitals for the past 24 hrs:   BP Temp Temp src Pulse Resp SpO2   06/07/23 0353 96/54 -- -- -- -- --   06/07/23 0226 (!) 86/45 36.4 C (97.5 F) Infrared 98 18 90 %    06/06/23 2253 139/74 36.7 C (98.1 F) Infrared 95 18 90 %   06/06/23 1904 137/69 36.7 C (98.1 F) Infrared 95 18 94 %   06/06/23 1530 118/59 36 C (96.8 F) Infrared 91 18 94 %   06/06/23 1035 105/53 36.5 C (97.7 F) Infrared 80 16 95 %   06/06/23 0656 92/51 36.4 C (97.5 F) Infrared 80 16 91 %     I/O last 3 completed shifts:  05/06 2300 - 05/07 2259  In: 738.3 (10.8 mL/kg) [P.O.:480; I.V.:158.3 (0.1 mL/kg/hr); IV Piggyback:100]  Out: 1425 (20.8 mL/kg) [Urine:1425 (0.9 mL/kg/hr)]  Net: -686.7  Weight: 68.7 kg   No intake/output data recorded.    Medications:  Current Facility-Administered Medications   Medication Dose Route Frequency    apixaban  (ELIQUIS ) tablet 5 mg  5 mg Oral 2 times per day    atorvastatin  (LIPITOR) tablet 40 mg  40 mg Oral Daily    famotidine (PEPCID) tablet 20 mg  20 mg Oral Nightly    gabapentin  (NEURONTIN ) capsule 300 mg  300 mg Oral Nightly    gabapentin  (NEURONTIN ) capsule 200 mg  200 mg Oral Daily    traZODone  (DESYREL ) tablet 100 mg  100 mg Oral Nightly    acetaminophen  (TYLENOL ) tablet 650 mg  650 mg Oral Q6H    oxyCODONE (ROXICODONE) immediate release tablet 5 mg  5 mg Oral Q4H PRN    senna (SENOKOT) tablet 2 tablet  2 tablet Oral Nightly    polyethylene glycol (GLYCOLAX ,MIRALAX ) powder 17 g  17 g Oral Daily    ondansetron (ZOFRAN) injection 4 mg  4 mg Intravenous Q4H PRN    haloperidol lactate (HALDOL) injection 0.5 mg  0.5 mg Intravenous Q3H PRN           Labs/Imaging:   Lab Results   Component Value Date    WBC 8.1 06/06/2023    HGB 11.1 (L) 06/06/2023    HCT 35 06/06/2023    MCV 97 06/06/2023    RDW 13.5 06/06/2023    PLT 175 06/06/2023       Lab Results   Component Value Date    CREAT 0.93 06/06/2023    NA 139 06/06/2023    K 3.9 06/06/2023    CL 104 06/06/2023    CO2 27 06/06/2023    ALT 22 05/03/2023    AST 24 05/03/2023    ALB 3.9 05/03/2023    CA 8.3 (L) 06/06/2023       No results found for: "FIO2", "PH", "O2SAT"

## 2023-06-07 NOTE — Plan of Care (Signed)
 Met with patient/husband to discuss RTS.  Informed cost is $35.  Husband has decided to pursue on his own.      Darien Eden RN, MSN, RN-BC  Health and safety inspector  Available by [employer]  9:51 AM

## 2023-06-08 ENCOUNTER — Other Ambulatory Visit: Payer: Self-pay

## 2023-06-08 LAB — HOLD LAVENDER

## 2023-06-08 MED ORDER — OXYCODONE HCL 5 MG PO TABS *I*
5.0000 mg | ORAL_TABLET | ORAL | 0 refills | Status: DC | PRN
Start: 2023-06-08 — End: 2023-06-27
  Filled 2023-06-08: qty 10, 2d supply, fill #0

## 2023-06-08 MED ORDER — SENNOSIDES 8.6 MG PO TABS *I*
2.0000 | ORAL_TABLET | Freq: Every evening | ORAL | 0 refills | Status: AC
Start: 2023-06-08 — End: ?
  Filled 2023-06-08: qty 100, 50d supply, fill #0

## 2023-06-08 MED ORDER — POLYETHYLENE GLYCOL 3350 PO POWD *I*
17.0000 g | Freq: Every day | ORAL | 0 refills | Status: DC
Start: 2023-06-09 — End: 2023-06-27
  Filled 2023-06-08: qty 510, 30d supply, fill #0

## 2023-06-08 NOTE — Progress Notes (Signed)
 Discharge instruction read with spouse at bedside. IV removed with catheter intact. VSS on room air. Patient able to ambulate, void and tolerate diet. Pain well controlled. Patient transferred to main lobby with all belongings.     Sherril Dole, RN

## 2023-06-08 NOTE — Progress Notes (Signed)
 Pt needs follow up post op gyn/oncology appointment post discharge.  Appointment scheduled for May 29th, 2025 at 11:15 am. Appointment details included in patient AVS.       06/28/2023 11:15 AM Arma Lamp, DO [facility] [phone]       Helane Lloyd, RN  RN Care Coordinator  [phone]  8:42 AM

## 2023-06-08 NOTE — Telephone Encounter (Signed)
 error

## 2023-06-08 NOTE — Discharge Instr - Meds (Signed)
 DISCHARGE INSTRUCTIONS    Brief hospital course:  You were admitted to the hospital following surgery w/ Dr. Felipe Horton and were discharged when meeting all post-op milestones    Call the office for any of the following symptoms:  - Nausea or Vomiting uncontrolled with oral medication  - Constipation unrelieved by medication  - Severe diarrhea  - Inability to tolerate oral intake  - Pain or burning with urination  - Shortness of breath  - Fever   - Unexplained bruising      We hope that your stay on [place] [address] at [place] was enjoyable. If you have any questions or concerns please do not hesitate to bring them up to your nurse or medical team.

## 2023-06-08 NOTE — Progress Notes (Signed)
 GYNECOLOGIC ONCOLOGY PROGRESS NOTE       Patient: Julie Walsh     Age: 73 y.o.     LOS:  LOS: 3 days     DOS: 3 Days Post-Op    Cancer Diagnosis: recurrent granulosa cell tumor     Subjective     Elisabeth Guild is feeling overall well. Pain is well-controlled with current regimen. Current diet is transitional, tolerating without nausea/vomiting. Continues to pass gas. Ambulated multiple times yesterday without difficulty. Denies fever/chills, chest pain, shortness of breath or additional symptoms.     Objective      Physical Exam:  Vitals:    06/07/23 1536 06/07/23 1920 06/07/23 2249 06/08/23 0247   BP: 135/77 133/68 139/66 143/81   BP Location: Left arm Right arm Right arm Left arm   Pulse: 84 92 92 93   Resp: 14 16 16 16    Temp: 36.4 C (97.5 F) 36.6 C (97.9 F) 36.7 C (98.1 F) 36.5 C (97.7 F)   TempSrc: Infrared Infrared Infrared Infrared   SpO2: 94% 92% 92% 91%   Weight:       Height:         Intake/Output:  I/O last 3 completed shifts:  05/07 2300 - 05/08 2259  In: 680 (9.9 mL/kg) [P.O.:680]  Out: 1100 (16 mL/kg) [Urine:1100 (0.7 mL/kg/hr)]  Net: -420  Weight: 68.7 kg      General: Well- appearing, no apparent distress  HEENT: Normocephalic, atraumatic   Respiratory: Nonlabored respirations, breathing comfortably on room air  Cardiovascular: Regular rate  Abdomen: +BS, soft, nondistended, appropriately tender to palpation  Incision(s): C/D/I  Neurologic: Alert, appropriate  Extremities: Nontender to palpation bilaterally       Labs      Recent Labs   Lab 06/07/23  2129 06/06/23  2252 06/05/23  2130   WBC 6.8 8.1 9.6   Hemoglobin 10.1* 11.1* 12.4   Hematocrit 31* 35 39   Platelets 181 175 204     Recent Labs   Lab 06/07/23  1908 06/06/23  1853 06/05/23  2130   Sodium 140 139 137   Potassium 3.6 3.9 4.1   Chloride 103 104 104   CO2 29* 27 23   UN 10 11 13    Creatinine 0.96* 0.93 0.94   Glucose 128* 142* 159*   Calcium  8.2* 8.3* 9.0   Magnesium 2.0 2.1 1.6         Medical Conditions       DVT - eliquis  5 mg BID - held POD0  HLD - atorvastatin  40 mg daily  Trigeminal neuralgia/sciatica - gabapentin  300 qhs  Anxiety/depression - trazodone  100 mg qhs        Assessment & Plan      ASSESSMENT  Elisabeth Guild is a 73 y.o. s/p ex lap, de-bulk, small bowel resection and primary reanastomosis on 5/6. Pt is doing well post operatively, meeting appropriate milestones.     Post op  - EBL 75 cc  - Pain: tylenol , oxycodone  prn  - H/H 12.8/41 > 12.4/39 > 11.1/35 > 10.1/31    F: SLIV  E: Daily  N: low residue diet  Lines: PIV  Antimicrobials: s/p Ancef /Flagyl  intra-op  VTE ppx: SCDs, Eliquis   Bowel reg/GI ppx: senna, miralax     Dispo: meeting post-op milestones, anticipate discharge today pending discussion with GYN ONC team    Alicia Apa, MD  OBGYN PGY-1

## 2023-06-08 NOTE — Discharge Summary (Signed)
 Name: Julie Walsh MRN: [id number] DOB: [birth year]     Admit Date: 06/05/2023   Date of Discharge: 06/08/2023     Patient was accepted for discharge to   Home or Self Care [1]       Discharge Attending Physician: Arma Lamp, DO      Hospitalization Summary    Concise Narrative: Julie Walsh is a 47F with history of recurrent granulosa cell tumor admitted to Gyn Oncology at [facility] 06/05/23 for scheduled exploratory laparotomy, tumor debulking and small bowel resection with side-to-side re-anastomosis. Tumor implants were removed from pelvis, omentum and small bowel. Patient tolerated the procedure well.  She met appropriate post-operative milestones and was discharged to home POD#3 in stable condition.        OR Procedure: 06/05/2023: Radical resection recurrent ovarian malignancy: exploratory laparotomy, removal of small bowel, peritoneal implants, removal of portion of omentum, small bowel resection with reanastomosis, lysis of adhesions with oversewing serotomy, optimal microscopic cytoreduction at the end of the case                         Signed: Alicia Apa, MD  On: 06/08/2023  at: 11:49 AM

## 2023-06-08 NOTE — Plan of Care (Signed)
Problem: Bowel Elimination  Goal: Elimination patterns are normal or improving  Outcome: Adequate for discharge  Goal: Patient demonstrates care and management of ostomy bag  Outcome: Adequate for discharge     Problem: GI Bleeding Elimination  Goal: Elimination of patterns are normal or improving  Outcome: Adequate for discharge     Problem: Fluid and Electrolyte Imbalance  Goal: Fluid and Electrolyte imbalance  Outcome: Adequate for discharge     Problem: Post-Operative Hemodynamic Stability  Goal: Maintain Hemodynamic Stability  Outcome: Adequate for discharge     Problem: Post-Operative Complications  Goal: Prevent post-operative complications  Outcome: Adequate for discharge  Goal: Patient will remain free from symptoms of infection-post op  Outcome: Adequate for discharge     Problem: Post-Operative Bowel Elimination  Goal: Elimination pattern is normal or improving  Outcome: Adequate for discharge     Problem: Post-Operative Bladder Elimination  Goal: Patient is able to empty bladder or return to baseline  Outcome: Adequate for discharge

## 2023-06-15 ENCOUNTER — Ambulatory Visit: Payer: Self-pay | Admitting: Obstetrics & Gynecology

## 2023-06-20 NOTE — Progress Notes (Unsigned)
 CC: Post Operative Appointment    HPI:  Julie Walsh 73 y.o. female presents today for post op exam following surgery on 06/05/2023. She underwent:  1. Radical resection recurrent ovarian malignancy: exploratory laparotomy, removal of small bowel, peritoneal implants, removal of portion of omentum, small bowel resection with reanastomosis, lysis of adhesions with oversewing serotomy, optimal microscopic cytoreduction at the end of the case     FINAL PATHOLOGY:   A. Small bowel, implant, biopsy:   -Granulosa cell tumor.    B. Transverse colon, implant, biopsy:   -Granulosa cell tumor.    C. Pelvic, small bowel implant, biopsy:   -Benign adipose tissue with hemorrhage.    D. Small bowel implants #2, biopsy:   -Granulosa cell tumor.    E. Omentum, partial omentectomy:   -Adipose tissue involved by scattered small foci of granulosa cell tumor.    F. Colonic implant, biopsy:   -Fibroadipose tissue involved by scattered small foci of atypical  cells, suspicious for granulosa cell tumor.    G. Soft tissue, right upper quadrant mass, resection:   -Two benign lymph nodes (0/2).   -Benign adipose tissue.    H. Small bowel, mass, resection:   -Granulosa cell tumor, with extensive intratumoral hemorrhage and cystic formation, predominantly involving small intestinal serosa/subserosa and muscularis propria.   -Background small intestinal mucosa with mild intraepithelial lymphocytosis.   -Resection margins negative for tumor.   -See comment.    No intra op or post op complications.    Returns for first post-op visit with no significant complaints. Bowel and bladder function have normalized. Denies fevers or chills. Denies any problems with wound. Denies lower extremity swelling.    Reports this am she feels dizzy/lightheaded. Got out of the shower and had to lie down. She has only had coffee to eat today. Provided with apple juice and snickers, symptoms resolved.     PE:  BP 100/60   Pulse 83   Temp 36.2 C (97.1 F)  (Tympanic)   Resp 20   Ht 152.4 cm (5')   Wt 66.7 kg (147 lb)   SpO2 93%   BMI 28.71 kg/m   General:  Pleasant, well nourished, NAD, A&O x 3  Abdomen:  Soft and nontender, negative masses, no organomegaly. Some slight skin separation around the umbilicus. Not infected. Cleansed with peroxide/water . Provided instructions/tips for keeping the area dry.   Incision(s): No induration or fluctuance or erythema.      Lower extremities:  Non-tender and non-edematous    Impression:    - Healing well s/p ex lap, debulking/removal of tumor implants, small bowel resection/reanastomosis  - Final pathology: recurrent Granulosa cell tumor  - Tumor Board Recommendations: Carbo/Taxol    Plan:  1. Post-op:    -Healing well following surgery.   -Post-operative instructions are reviewed. All have been lifted at this time.     2. Granulosa cell tumor:   -A copy of the pathology report is printed and provided to the patient.    -The findings of the surgical pathology are reviewed in detail.  -Discussed that while we removed all the implants we could see, there could be residual cancer cells present.   -In this setting, adjuvant treatment with Carbo/Taxol is suggested.   -Will plan to start Carbo/Taxol.   -Will stop Femara    -Teaching and consent are obtained today.   -All questions are answered.     I met today with the patient for a total of 45 minutes, all of which  were spent discussing the pathology report, implications of the stage, prognosis, and the recommended treatment plan including risks, benefits, and alternatives.    Arma Lamp, DO  06/28/2023  11:14 AM

## 2023-06-27 ENCOUNTER — Encounter: Payer: Self-pay | Admitting: Obstetrics & Gynecology

## 2023-06-27 ENCOUNTER — Other Ambulatory Visit: Payer: Self-pay

## 2023-06-28 ENCOUNTER — Ambulatory Visit: Attending: Obstetrics & Gynecology | Admitting: Obstetrics & Gynecology

## 2023-06-28 ENCOUNTER — Ambulatory Visit: Payer: Medicare Other | Admitting: Obstetrics & Gynecology

## 2023-06-28 VITALS — BP 100/60 | HR 83 | Temp 97.1°F | Resp 20 | Ht 60.0 in | Wt 147.0 lb

## 2023-06-28 DIAGNOSIS — Z483 Aftercare following surgery for neoplasm: Secondary | ICD-10-CM | POA: Insufficient documentation

## 2023-06-28 DIAGNOSIS — D3910 Neoplasm of uncertain behavior of unspecified ovary: Secondary | ICD-10-CM | POA: Insufficient documentation

## 2023-06-28 DIAGNOSIS — Z712 Person consulting for explanation of examination or test findings: Secondary | ICD-10-CM | POA: Insufficient documentation

## 2023-06-28 MED ORDER — PROCHLORPERAZINE MALEATE 10 MG PO TABS *I*
10.0000 mg | ORAL_TABLET | Freq: Four times a day (QID) | ORAL | 5 refills | Status: DC | PRN
Start: 2023-06-28 — End: 2023-09-24

## 2023-06-28 MED ORDER — ONDANSETRON HCL 8 MG PO TABS *I*
8.0000 mg | ORAL_TABLET | Freq: Three times a day (TID) | ORAL | 5 refills | Status: DC | PRN
Start: 2023-06-28 — End: 2023-09-24

## 2023-06-28 NOTE — Progress Notes (Signed)
Chemotherapy folder given to patient with Taxol/Carbo teaching sheets, Cancer Wellness Connections pamphlet, Lab location pamphlet, Massage & Accupuncturist contacts, and directions to [facility] at [facility].  Information on managing constipation, contact information for our provider office and when to call.  Wig information booklet and contacts for obtaining wigs.  Social work contact information, disability information, information on Integrative Oncology.  Caring for yourself after chemotherapy, protein sources and Ways to learn mor about you're cancer diagnosis.  This information was reviewed with the patient and patient verbalized understanding and would review further at home.  Answered all questions and concerns.

## 2023-06-28 NOTE — Addendum Note (Signed)
 Addended by: Darold Elk on: 06/28/2023 01:06 PM     Modules accepted: Orders

## 2023-06-29 ENCOUNTER — Telehealth: Payer: Self-pay

## 2023-06-29 DIAGNOSIS — D3910 Neoplasm of uncertain behavior of unspecified ovary: Secondary | ICD-10-CM

## 2023-06-29 NOTE — Telephone Encounter (Signed)
 Follow up call to let Julie Walsh know that chemotherapy appointment is set for Tues June 3 @ 8 a.m. Instructed to go for labs Saturday or Monday. Julie Walsh would like to proceed with a port as they are having difficulty getting IV access. Instructed I will get her set up for one prior to her next cycle.

## 2023-06-30 ENCOUNTER — Other Ambulatory Visit
Admission: RE | Admit: 2023-06-30 | Discharge: 2023-06-30 | Disposition: A | Discharge status: Home / Self Care | Source: Ambulatory Visit | Attending: Obstetrics & Gynecology | Admitting: Obstetrics & Gynecology

## 2023-06-30 DIAGNOSIS — Z8543 Personal history of malignant neoplasm of ovary: Secondary | ICD-10-CM | POA: Insufficient documentation

## 2023-06-30 DIAGNOSIS — D3910 Neoplasm of uncertain behavior of unspecified ovary: Secondary | ICD-10-CM | POA: Insufficient documentation

## 2023-06-30 LAB — COMPREHENSIVE METABOLIC PANEL
ALT: 23 U/L (ref 0–35)
AST: 22 U/L (ref 0–35)
Albumin: 3.9 g/dL (ref 3.5–5.2)
Alk Phos: 99 U/L (ref 35–105)
Anion Gap: 13 (ref 7–16)
Bilirubin,Total: 0.3 mg/dL (ref 0.0–1.2)
CO2: 23 mmol/L (ref 20–28)
Calcium: 9.5 mg/dL (ref 8.6–10.2)
Chloride: 106 mmol/L (ref 96–108)
Creatinine: 1.2 mg/dL — ABNORMAL HIGH (ref 0.51–0.95)
Glucose: 112 mg/dL — ABNORMAL HIGH (ref 60–99)
Lab: 14 mg/dL (ref 6–20)
Potassium: 3.8 mmol/L (ref 3.3–5.1)
Sodium: 142 mmol/L (ref 133–145)
Total Protein: 6.5 g/dL (ref 6.3–7.7)
eGFR BY CREAT: 47 * — AB

## 2023-06-30 LAB — CBC AND DIFFERENTIAL
Baso # K/uL: 0.1 10*3/uL (ref 0.0–0.2)
Eos # K/uL: 0.5 10*3/uL (ref 0.0–0.5)
Hematocrit: 38 % (ref 34–49)
Hemoglobin: 11.7 g/dL (ref 11.2–16.0)
IMM Granulocytes #: 0 10*3/uL (ref 0.0–0.0)
IMM Granulocytes: 0.2 %
Lymph # K/uL: 1.4 10*3/uL (ref 1.0–5.0)
MCV: 97 fL (ref 75–100)
Mono # K/uL: 0.5 10*3/uL (ref 0.1–1.0)
Neut # K/uL: 3.1 10*3/uL (ref 1.5–6.5)
Nucl RBC # K/uL: 0 10*3/uL (ref 0.0–0.0)
Nucl RBC %: 0 /100{WBCs} (ref 0.0–0.2)
Platelets: 241 10*3/uL (ref 150–450)
RBC: 3.9 MIL/uL — ABNORMAL LOW (ref 4.0–5.5)
RDW: 13.4 % (ref 0.0–15.0)
Seg Neut %: 55.6 %
WBC: 5.6 10*3/uL (ref 3.5–11.0)

## 2023-06-30 LAB — CA 125: CA 125(eff 4-2010): 37 U/mL — ABNORMAL HIGH (ref 0–34)

## 2023-06-30 LAB — APTT: aPTT: 29.8 s (ref 25.8–37.9)

## 2023-07-02 ENCOUNTER — Telehealth: Payer: Self-pay | Admitting: Obstetrics & Gynecology

## 2023-07-02 ENCOUNTER — Encounter: Payer: Self-pay | Admitting: Obstetrics & Gynecology

## 2023-07-02 ENCOUNTER — Other Ambulatory Visit: Payer: Self-pay

## 2023-07-02 NOTE — Telephone Encounter (Signed)
 Pt calling to have ap t6/23 resch. Asking for call back-Verified number.

## 2023-07-02 NOTE — Telephone Encounter (Signed)
 Called and spoke with Elisabeth Guild, she is all set and rescheduled to 6/20 at 3:30 pm.

## 2023-07-02 NOTE — Progress Notes (Signed)
 [facility] TREATMENT HAND-OFF TOOL:   SITUATION:   Scheduled treatment category for today:     Is this a new cancer treatment?: Yes    New co-signed height/weight done within last 30 days: Yes    Patient seen by MD    Consent obtained:  Yes    Location:  Media    Labs complete:  Yes    Within parameters: Yes      Date of lab work: 06/30/2023    Current patient status:  Scheduled treatment    Additional relevant information:  Other    Comments on information needed:  Regimen (Consent/Date [06/28/2023]): Taxol/Carbo Q3 weeks.   Cosigned Ht/Wt: 06/28/2023  Blood Transfusion consent: 06/28/2023  Today's Cycle: **NP C1 Taxol/Carbo 07/03/2023   IV Access: PIV     Active issues: Recurrent Granulosa Cell Tumor     Appts needed: Schedule C2 Taxol/Carbo in 3 weeks on 07/24/23. Remind patient to get blood work Friday prior.     Today's confirmed treatment plan: Taxol/Carbo

## 2023-07-03 ENCOUNTER — Emergency Department
Admission: EM | Admit: 2023-07-03 | Discharge: 2023-07-03 | Disposition: A | Discharge status: Home / Self Care | Source: Ambulatory Visit | Attending: Student in an Organized Health Care Education/Training Program | Admitting: Student in an Organized Health Care Education/Training Program

## 2023-07-03 ENCOUNTER — Encounter: Payer: Self-pay | Admitting: Student in an Organized Health Care Education/Training Program

## 2023-07-03 ENCOUNTER — Emergency Department

## 2023-07-03 ENCOUNTER — Ambulatory Visit: Attending: Gynecologic Oncology

## 2023-07-03 ENCOUNTER — Encounter: Payer: Self-pay | Admitting: Obstetrics & Gynecology

## 2023-07-03 ENCOUNTER — Other Ambulatory Visit: Payer: Self-pay

## 2023-07-03 ENCOUNTER — Other Ambulatory Visit: Payer: Self-pay | Admitting: Obstetrics & Gynecology

## 2023-07-03 VITALS — BP 60/40 | HR 90 | Temp 98.2°F | Resp 16 | Wt 150.3 lb

## 2023-07-03 DIAGNOSIS — R55 Syncope and collapse: Secondary | ICD-10-CM | POA: Insufficient documentation

## 2023-07-03 DIAGNOSIS — Z5111 Encounter for antineoplastic chemotherapy: Secondary | ICD-10-CM | POA: Insufficient documentation

## 2023-07-03 DIAGNOSIS — R531 Weakness: Secondary | ICD-10-CM | POA: Insufficient documentation

## 2023-07-03 DIAGNOSIS — R918 Other nonspecific abnormal finding of lung field: Secondary | ICD-10-CM

## 2023-07-03 DIAGNOSIS — R9431 Abnormal electrocardiogram [ECG] [EKG]: Secondary | ICD-10-CM

## 2023-07-03 DIAGNOSIS — J984 Other disorders of lung: Secondary | ICD-10-CM

## 2023-07-03 DIAGNOSIS — J449 Chronic obstructive pulmonary disease, unspecified: Secondary | ICD-10-CM

## 2023-07-03 DIAGNOSIS — D3910 Neoplasm of uncertain behavior of unspecified ovary: Secondary | ICD-10-CM | POA: Insufficient documentation

## 2023-07-03 DIAGNOSIS — J189 Pneumonia, unspecified organism: Secondary | ICD-10-CM | POA: Insufficient documentation

## 2023-07-03 DIAGNOSIS — Z87891 Personal history of nicotine dependence: Secondary | ICD-10-CM

## 2023-07-03 DIAGNOSIS — I6782 Cerebral ischemia: Secondary | ICD-10-CM

## 2023-07-03 DIAGNOSIS — Z789 Other specified health status: Secondary | ICD-10-CM

## 2023-07-03 DIAGNOSIS — G9389 Other specified disorders of brain: Secondary | ICD-10-CM

## 2023-07-03 DIAGNOSIS — Z8543 Personal history of malignant neoplasm of ovary: Secondary | ICD-10-CM

## 2023-07-03 LAB — BASIC METABOLIC PANEL
Anion Gap: 24 — ABNORMAL HIGH (ref 7–16)
CO2: 16 mmol/L — ABNORMAL LOW (ref 20–28)
Calcium: 9.2 mg/dL (ref 8.6–10.2)
Chloride: 105 mmol/L (ref 96–108)
Creatinine: 1.17 mg/dL — ABNORMAL HIGH (ref 0.51–0.95)
Glucose: 249 mg/dL — ABNORMAL HIGH (ref 60–99)
Lab: 13 mg/dL (ref 6–20)
Potassium: 2.9 mmol/L — ABNORMAL LOW (ref 3.3–5.1)
Sodium: 145 mmol/L (ref 133–145)
eGFR BY CREAT: 49 * — AB

## 2023-07-03 LAB — EKG 12-LEAD
P: 46 deg
PR: 160 ms
QRS: 11 deg
QRSD: 72 ms
QT: 289 ms
QTc: 346 ms
Rate: 86 {beats}/min
T: 7 deg

## 2023-07-03 LAB — URINALYSIS REFLEX TO CULTURE
Blood,UA: NEGATIVE
Glucose,UA: NEGATIVE
Leuk Esterase,UA: NEGATIVE
Nitrite,UA: NEGATIVE
Specific Gravity,UA: 1.021 (ref 1.002–1.030)
pH,UA: 5 (ref 5.0–8.0)

## 2023-07-03 LAB — RUQ PANEL (ED ONLY)
ALT: 41 U/L — ABNORMAL HIGH (ref 0–35)
AST: <7 U/L (ref 0–35)
Albumin: 4 g/dL (ref 3.5–5.2)
Alk Phos: 99 U/L (ref 35–105)
Amylase: 37 U/L (ref 28–100)
Bili,Indirect: 0.3 mg/dL (ref 0.1–1.0)
Bilirubin,Direct: 0.5 mg/dL — ABNORMAL HIGH (ref 0.0–0.3)
Bilirubin,Total: 0.8 mg/dL (ref 0.0–1.2)
Lipase: 31 U/L (ref 13–60)
Total Protein: 6.8 g/dL (ref 6.3–7.7)

## 2023-07-03 LAB — URINE MICROSCOPIC (IQ200)
RBC,UA: NONE SEEN /HPF (ref 0–2)
WBC,UA: NONE SEEN /HPF (ref 0–5)

## 2023-07-03 LAB — CBC AND DIFFERENTIAL
Baso # K/uL: 0 10*3/uL (ref 0.0–0.2)
Eos # K/uL: 0.1 10*3/uL (ref 0.0–0.5)
Hematocrit: 40 % (ref 34–49)
Hemoglobin: 12.3 g/dL (ref 11.2–16.0)
IMM Granulocytes #: 0.3 10*3/uL — ABNORMAL HIGH (ref 0.0–0.0)
IMM Granulocytes: 3.1 %
Lymph # K/uL: 2.4 10*3/uL (ref 1.0–5.0)
MCV: 101 fL — ABNORMAL HIGH (ref 75–100)
Mono # K/uL: 0.1 10*3/uL (ref 0.1–1.0)
Neut # K/uL: 5.6 10*3/uL (ref 1.5–6.5)
Nucl RBC # K/uL: 0 10*3/uL (ref 0.0–0.0)
Nucl RBC %: 0 /100{WBCs} (ref 0.0–0.2)
Platelets: 198 10*3/uL (ref 150–450)
RBC: 4 MIL/uL (ref 4.0–5.5)
RDW: 13.6 % (ref 0.0–15.0)
Seg Neut %: 66.6 %
WBC: 8.4 10*3/uL (ref 3.5–11.0)

## 2023-07-03 LAB — TROPONIN T 1 HR W/ DELTA HIGH SENSITIVITY
TROP T 0-1 HR DELTA High Sensitivity: -1 — ABNORMAL LOW (ref 0–2)
TROP T 1 HR High Sensitivity: 14 ng/L — ABNORMAL HIGH (ref 0–11)

## 2023-07-03 LAB — TROPONIN T 3 HR W/ DELTA HIGH SENSITIVITY (IP/ED ONLY)
TROP T 0-3 HR DELTA High Sensitivity: -1 — ABNORMAL LOW (ref 0–4)
TROP T 3 HR High Sensitivity: 14 ng/L (ref 0–14)

## 2023-07-03 LAB — MAGNESIUM: Magnesium: 1.9 mg/dL (ref 1.6–2.5)

## 2023-07-03 LAB — TROPONIN T 0 HR HIGH SENSITIVITY (IP/ED ONLY): TROP T 0 HR High Sensitivity: 15 ng/L — ABNORMAL HIGH (ref 0–11)

## 2023-07-03 MED ORDER — AMOXICILLIN-POT CLAVULANATE 875-125 MG PO TABS *I*
1.0000 | ORAL_TABLET | Freq: Two times a day (BID) | ORAL | 0 refills | Status: AC
Start: 2023-07-03 — End: 2023-07-08

## 2023-07-03 MED ORDER — DIPHENHYDRAMINE HCL 50 MG/ML IJ SOLN *I*
50.0000 mg | Freq: Once | INTRAMUSCULAR | Status: AC
Start: 2023-07-03 — End: 2023-07-03
  Administered 2023-07-03: 50 mg via INTRAVENOUS

## 2023-07-03 MED ORDER — FAMOTIDINE (PF) 20 MG/2ML IV SOLN *I*
20.0000 mg | Freq: Once | INTRAVENOUS | Status: AC
Start: 2023-07-03 — End: 2023-07-03
  Administered 2023-07-03: 20 mg via INTRAVENOUS

## 2023-07-03 MED ORDER — DIPHENHYDRAMINE HCL 50 MG/ML IJ SOLN *I*
50.0000 mg | INTRAMUSCULAR | Status: AC | PRN
Start: 2023-07-03 — End: 2023-07-03
  Administered 2023-07-03: 50 mg via INTRAVENOUS

## 2023-07-03 MED ORDER — SODIUM CHLORIDE 0.9 % FLUSH FOR PUMPS *I*
0.0000 mL/h | INTRAVENOUS | Status: DC | PRN
Start: 2023-07-03 — End: 2023-07-04

## 2023-07-03 MED ORDER — SODIUM CHLORIDE 0.9 % IV SOLN WRAPPED *I*
1000.0000 mL | Status: DC | PRN
Start: 2023-07-03 — End: 2023-07-03

## 2023-07-03 MED ORDER — EPINEPHRINE 0.3 MG/0.3ML IJ SOAJ *I*
0.3000 mg | INTRAMUSCULAR | Status: DC | PRN
Start: 2023-07-03 — End: 2023-07-03

## 2023-07-03 MED ORDER — DEXAMETHASONE SOD PHOSPHATE PF 10 MG/ML IJ SOLN *I*
INTRAMUSCULAR | Status: AC
Start: 2023-07-03 — End: 2023-07-03
  Filled 2023-07-03: qty 2

## 2023-07-03 MED ORDER — FAMOTIDINE (PF) 20 MG/2ML IV SOLN *I*
20.0000 mg | INTRAVENOUS | Status: DC | PRN
Start: 2023-07-03 — End: 2023-07-03
  Administered 2023-07-03: 20 mg via INTRAVENOUS

## 2023-07-03 MED ORDER — DIPHENHYDRAMINE HCL 50 MG/ML IJ SOLN *I*
25.0000 mg | INTRAMUSCULAR | Status: DC | PRN
Start: 2023-07-03 — End: 2023-07-03

## 2023-07-03 MED ORDER — ALBUTEROL SULFATE (2.5 MG/3ML) 0.083% IN NEBU *I*
2.5000 mg | INHALATION_SOLUTION | RESPIRATORY_TRACT | Status: DC | PRN
Start: 2023-07-03 — End: 2023-07-03

## 2023-07-03 MED ORDER — SODIUM CHLORIDE 0.9 % IV SOLN WRAPPED *I*
175.0000 mg/m2 | Freq: Once | INTRAVENOUS | Status: AC
Start: 2023-07-03 — End: 2023-07-03
  Administered 2023-07-03: 300 mg via INTRAVENOUS
  Filled 2023-07-03: qty 50

## 2023-07-03 MED ORDER — DIPHENHYDRAMINE HCL 50 MG/ML IJ SOLN *I*
INTRAMUSCULAR | Status: AC
Start: 2023-07-03 — End: 2023-07-03
  Filled 2023-07-03: qty 1

## 2023-07-03 MED ORDER — FAMOTIDINE (PF) 20 MG/2ML IV SOLN *I*
INTRAVENOUS | Status: AC
Start: 2023-07-03 — End: 2023-07-03
  Filled 2023-07-03: qty 2

## 2023-07-03 MED ORDER — DEXTROSE 5 % FLUSH FOR PUMPS *I*
0.0000 mL/h | INTRAVENOUS | Status: DC | PRN
Start: 2023-07-03 — End: 2023-09-01

## 2023-07-03 MED ORDER — DEXTROSE 5 % IV SOLN WRAPPED *I*
341.5000 mg | Freq: Once | INTRAVENOUS | Status: AC
Start: 2023-07-03 — End: 2023-07-03
  Administered 2023-07-03: 341.5 mg via INTRAVENOUS
  Filled 2023-07-03: qty 34.15

## 2023-07-03 MED ORDER — DEXAMETHASONE SOD PHOSPHATE PF 10 MG/ML IJ SOLN *I*
20.0000 mg | Freq: Once | INTRAMUSCULAR | Status: AC
Start: 2023-07-03 — End: 2023-07-03
  Administered 2023-07-03: 20 mg via INTRAVENOUS

## 2023-07-03 MED ORDER — PALONOSETRON HCL 0.25 MG/5ML IV SOLN *I*
0.2500 mg | Freq: Once | INTRAVENOUS | Status: AC
Start: 2023-07-03 — End: 2023-07-03
  Administered 2023-07-03: 0.25 mg via INTRAVENOUS

## 2023-07-03 MED ORDER — METHYLPREDNISOLONE SOD SUCC 125 MG IJ SOLR(62.5MG/ML) *WRAPPED*
125.0000 mg | INTRAMUSCULAR | Status: DC | PRN
Start: 2023-07-03 — End: 2023-07-03

## 2023-07-03 MED ORDER — METHYLPREDNISOLONE SOD SUCC 125 MG IJ SOLR(62.5MG/ML) *WRAPPED*
62.5000 mg | INTRAMUSCULAR | Status: DC | PRN
Start: 2023-07-03 — End: 2023-07-03

## 2023-07-03 MED ORDER — EPINEPHRINE 0.3 MG/0.3ML IJ SOAJ *I*
0.3000 mg | INTRAMUSCULAR | Status: AC | PRN
Start: 2023-07-03 — End: 2023-07-03
  Administered 2023-07-03 (×2): 0.3 mg via INTRAMUSCULAR

## 2023-07-03 MED ORDER — SODIUM CHLORIDE 0.9 % IV SOLN WRAPPED *I*
10.0000 mL/h | Status: DC
Start: 2023-07-03 — End: 2023-07-03

## 2023-07-03 MED ORDER — APREPITANT 130 MG/18ML IV EMUL *I*
INTRAVENOUS | Status: AC
Start: 2023-07-03 — End: 2023-07-03
  Filled 2023-07-03: qty 18

## 2023-07-03 MED ORDER — SODIUM CHLORIDE 0.9 % IV SOLN WRAPPED *I*
30.0000 mL/h | Status: DC | PRN
Start: 2023-07-03 — End: 2023-07-03
  Administered 2023-07-03: 30 mL/h via INTRAVENOUS

## 2023-07-03 MED ORDER — ACETAMINOPHEN 325 MG PO TABS *I*
650.0000 mg | ORAL_TABLET | ORAL | Status: DC | PRN
Start: 2023-07-03 — End: 2023-07-03

## 2023-07-03 MED ORDER — APREPITANT 130 MG/18ML IV EMUL *I*
130.0000 mg | Freq: Once | INTRAVENOUS | Status: AC
Start: 2023-07-03 — End: 2023-07-03
  Administered 2023-07-03: 130 mg via INTRAVENOUS

## 2023-07-03 MED ORDER — PALONOSETRON HCL 0.25 MG/5ML IV SOLN *I*
INTRAVENOUS | Status: AC
Start: 2023-07-03 — End: 2023-07-03
  Filled 2023-07-03: qty 5

## 2023-07-03 MED ORDER — AMOXICILLIN-POT CLAVULANATE 875-125 MG PO TABS *I*
1.0000 | ORAL_TABLET | Freq: Once | ORAL | Status: AC
Start: 2023-07-03 — End: 2023-07-03
  Administered 2023-07-03: 1 via ORAL
  Filled 2023-07-03: qty 1

## 2023-07-03 MED ORDER — DIPHENHYDRAMINE HCL 50 MG/ML IJ SOLN *I*
50.0000 mg | INTRAMUSCULAR | Status: DC | PRN
Start: 2023-07-03 — End: 2023-07-03

## 2023-07-03 MED ORDER — FAMOTIDINE (PF) 20 MG/2ML IV SOLN *I*
20.0000 mg | INTRAVENOUS | Status: DC | PRN
Start: 2023-07-03 — End: 2023-07-03

## 2023-07-03 MED ORDER — METHYLPREDNISOLONE SOD SUCC 125 MG IJ SOLR(62.5MG/ML) *WRAPPED*
125.0000 mg | INTRAMUSCULAR | Status: DC | PRN
Start: 2023-07-03 — End: 2023-07-03
  Administered 2023-07-03: 125 mg via INTRAVENOUS

## 2023-07-03 NOTE — Discharge Instructions (Signed)
 Julie Walsh was seen at the [facility] Emergency Room for weakness and an episode of loss of consciousness on July 03, 2023.    Blood tests, urine studies, X-ray(s), and CT scan(s) were completed which did not show an explanation for your symptoms. In many cases we do not find the cause of people's symptoms in the Emergency Department, but we did rule out the most serious life-threatening causes.    You should follow-up with your primary care physician in 2 day(s).    Please return to our emergency department or seek evaluation for fever greater than 103F despite Tylenol  or Motrin use, chest pain, difficulty breathing , unable to eat or drink, vomiting, fainting, confusion, headache, numbness, and severe pain, or any new or concerning symptoms.     Thank you for choosing [facility] for your care.

## 2023-07-03 NOTE — ED Triage Notes (Signed)
 Pt was the MERT response to the ED for abdominal pain, pt got up to use the bathroom when she shad a syncopal episode. Pt was only 5 minutes into her chemo infusion. Pt was given epi x2, benadryl,, Pepcid  and solumedrol while in the infusion center. Pt is minimally responsive and placed on a non re breather, pale and diaphoretic.         Prehospital medications given: No

## 2023-07-03 NOTE — Progress Notes (Signed)
 [facility] SOCIAL WORK:     Patient referred based on distress screen?: No      Patient referred due to G8?: No    Social Work contact made?: Yes      Method of contact:  Telephone    Other method of contact:  Call to husband Gita Lamb    Social Work Assessment:  Educational psychologist with:  Family    Social Work post assessment plan:  As needed follow-up  Referral:     Time spent on this encounter (in minutes):  20    Patient presented to the infusion room for treatment.  Patient had a reaction and is being taken via MERT team to the emergency room.      Writer call husband to inform him of the above.  He is currently at home but will drive in and go immediately to the ED upon arrival.     Karon Packer, LMSW, OSW  [facility]   Gynecologic and Geriatric Oncology   p [phone]

## 2023-07-03 NOTE — Patient Instructions (Signed)
 Caring For Yourself After Chemotherapy    General Tips    Stay active!  It is important to continue doing the activities that interest you.     Keep eating! Eat high fiber foods such as green leafy vegetables and whole grain breads. Unless you have been told otherwise, PLEASE remember to eat on your scheduled treatment days. Eating a light meal before treatments is recommended. Feel free to bring your own food to the infusion center.  Avoid fatty, fried, greasy, and spicy foods on the day of treatment.     Drinking fluids is very important for your kidneys. You should drink at least eight (8 ounce) glasses of non-caffeinated liquids each day.  Some therapies require larger amounts of daily fluid intake; your health care team will tell you if more liquids are needed with your particular chemotherapy. Avoid juices and foods that have a high acid content such as orange, grapefruit juice, tomato juice, etc.    Beer, wine and hard liquor may interfere with your treatment. Talk to your cancer care team to see if it is okay for you to drink alcohol.    Take your anti-nausea medication as directed. You should take your prescribed medicine as soon as you feel queasy to avoid vomiting. If the nausea medication is not effectively working, call your cancer care team for direction.    Some over the counter medications, supplements and herbs may interfere with the other medicines you are using. Please bring a list of what you are taking to your appointments and check with your cancer care team before taking any over the counter medicines, supplements, or herbs.    The medical oncology team here to help you. If you have any questions about your symptoms or treatment please ask us . You can call and talk to your nurse if you have questions or concerns any time during business hours.  You may choose to leave a message for your cancer care team and they will call you back. We always have someone on-call after business hours,  and on weekends for emergencies. We are here to help you.      The following lists are some common side effects related to chemotherapy treatment and how to care for yourself during this time. Your nurse will tell you which side effects you are more likely to have because of the treatment you are getting.    Important:    Please call your oncologist's office IMMEDIATELY if you have any of these side effects:   Fever greater than or equal to 38.0 C or 100.4 F  Shaking chills   Nosebleed that won't stop  Chest pain  Shortness of breath or trouble breathing  Nausea (sick to your stomach) or vomiting (throwing up) that prevents you from eating for 24 hours or more  Signs of infection:  - Burning sensation with urination (peeing)  - Rash, with itching skin  - New or worsening cough  - Sore throat  - Redness, pain or tenderness, drainage from the skin, mediport or IV site      Infection:    Because chemotherapy may change the way your immune system works, it is important to prevent infections by following these steps:  Wash your hands often, especially before eating and after using the restroom.  Take baths or showers often. Use lotions to keep your skin moist, to help prevent drying or cracking skin. If radiation therapy is part of your treatment, please speak with your radiation nurse  before using any lotions on the areas where you are getting radiation.  Wear gloves when gardening or cleaning up after pets to avoid cuts, scrapes and contact with animal germs  Talk with your oncologist before getting any dental work. If they say it is okay to get the dental work, be sure to tell your dentist you are getting chemotherapy.  Avoid people with known infections or colds.  Talk with your doctor before receiving any immunizations (shots to prevent disease).  Be cautious with the use of acetaminophen  (Tylenol ) as this may hide a fever  Avoid salon manicures and pedicures while on treatment  Avoid large crowds, especially  if  you feel weak or have a cold.    Bleeding:    Talk to your cancer care team before taking pain medicines like aspirin , Advil, Motrin, ibuprofen or naproxen (Aleve) as these can put you at risk for bleeding.   Avoid contact sports.  Avoid becoming constipated (unable to move your bowels) since this can cause straining and increase your risk for rectal bleeding.    If you have any of the following side effects, call your doctor's office immediately:    Nose bleeds or bleeding gums  Bruising without a known cause  Blood in your urine or stool  Petechiae or red dots under your skin    Low Red blood Cell Count:    Red blood cells carry oxygen to the cells of your body. When your red blood cells are low you are anemic.     If you have any of the following side effects, call your doctor's office:    Fast heart rate  Chest pain  Feeling that it is hard to catch your breath  Dizziness, especially when getting out of bed or up from a chair  Feeling very tired or weak   Having trouble staying warm    Nausea or Vomiting:    Eat small frequent meals. Bland foods are best, avoiding spicy or fried foods  Eat foods at room temperature or slightly cooler to decrease smells that can make you nauseous (feel sick to your stomach)  Rest after meals, keeping your head high  Eat a small amount on treatment days  If your doctor orders anti-nausea medicine, please carefully read the doctor's instructions     Contact your doctor's office if you experience:    Nausea and/or vomiting unrelieved by anti-nausea medications  Nausea and/or vomiting that prevents you from eating or drinking for 24 hours or more    Diarrhea:    Diarrhea is an increase in number of bowel movements (stools) compared to normal; stool may be watery.    Eat a low fiber diet, including bananas, white rice, applesauce, toast, mashed potatoes, skinless [place] or chicken, fish and well-cooked eggs  Eat small meals and drink lots of fluid (at least eight glasses, 8 ounces  each)  If your doctor orders anti-diarrhea medicine, please carefully read the doctor's instructions.  Avoid milk and milk products, raw fruits and vegetables, greasy, fried foods, alcohol, substances containing sorbitol (sugar free candy or gum)    If you have any of the following side effects, call your doctor's office:    Diarrhea 3 times or more a day      Constipation:    Constipation means that a person has three or fewer bowel movements (stool, poop) in a week. The stool can be hard and dry. Sometimes it is painful to pass    Eat high  fiber foods such as fruits (prune juice), vegetables, whole grains etc  Regular exercise, especially walking, is a good way to ease constipation. Before beginning any new activities, talk with your cancer care team about the right exercise plan for you  If your doctor orders laxative or stool-softener medicine, please carefully read the doctor's instructions.    If you have any of the following side effects, call your doctor's office:    If you have not had a bowel movement in 2 days    Nutrition:    Prepare meals before chemotherapy days so that you always have something nutritious ready   Eat a third (1/3) of your daily calories for breakfast since your appetite will usually decrease as the day goes on.  If you are not hungry or become full after eating a small amount, having small meals many times during the day can help you get the nutrition you need.  Light exercise may help increase your appetite, such as walking   Eating foods from the 4 basic foods groups (fruits/vegetables, meats, grains and dairy) can help you get a well-balanced diet.   If you experience a bitter or metallic taste in your mouth try using plastic utensils. Sucking on hard candy or chewing gum can help.  Use nutritional supplements (like protein powder or a high calorie drink) to make sure you are getting enough calories when your appetite is poor.  Nutrition During Chemotherapy video available at  https://youtu.be/nCjQt6zmPKA or scan QR code with your smartphone camera:         Mouth Sores (Mucositis):    Use a soft toothbrush and brush gently to avoid damaging the soft tissue in your mouth. Replace your toothbrush every 3 months or as needed  Inspect your mouth daily for open areas  Avoid mouth washes that contain alcohol such as Scope or Listerine, as these can irritate and/or dry your mouth. Biotene is an alcohol free mouth wash that can be purchased over the counter or ask your dentist for alternatives.  Use a baking soda rinse four times daily;  teaspoon baking soda, 1/8 teaspoon salt, and 1 cup warm water .     If you have any of the following side effects, call your doctor's office:  Mouth sores that are painful and keep you from eating or drinking for more than 24 hours  White patches on your tongue or mouth that do not wipe off while brushing    Fatigue:     Fatigue (feeling tired or weak) is a common side effect of chemotherapy.   Figure out which tasks must be done and which tasks can wait for another time  Set realistic and reachable goals.  Pace yourself; switch between periods of activity with periods of rest  Take short naps during the day (no more than 1 hour). Longer naps can make you more tired, decrease energy levels and disrupt a natural sleep pattern  Keep an activities journal to help track fatigue patterns  Put together a list of friends and/or family members who can help you with meals, running errands and household chores. Letting others help you will allow you to save your energy for things only you can do  Stay hydrated  Regular exercise, especially walking, is a good way to ease fatigue. Before beginning any new activities, talk to your doctor about the right exercise plan for you  Massage therapy can help with feelings of fatigue. Our licensed massage therapist is available free of charge. If interested,  ask at the checkout desk    If you have any of the following side effects,  call your doctor's office:    Moderate fatigue. Fatigue that does not get better with rest and stops you from doing certain tasks is considered moderate  Severe fatigue. Fatigue that does not get better with rest and that stops you from doing important self-care tasks such as bathing and dressing is considered severe.      Hair Loss (Alopecia):    Not all chemotherapy makes hair fall out and some can make your hair thinner. Ask your team about your chemotherapy and hair loss   Use mild shampoos, soft hair brushes and a low heat setting on hair dryers or let hair air-dry  Avoid using chemicals on your hair (perms or hair dye), these can speed up hair loss  Avoid using hot rollers, curling irons, brush rollers, hair clips, or hair tie  Typically, hair loss will begin 2-3 weeks after treatment begins  It is helpful to buy wigs or head coverings (scarves, wraps, or hats) before your hair starts to fall out. Ask your nurse for a list of stores that sell these and for a prescription    Body Fluid Precautions:    Chemotherapy can stay in some of your body fluids (urine, stool, saliva, semen and vaginal secretions) for approximately 48-72 hours following your chemotherapy treatment.    The following can help keep the people you live with safe after you get an infusion or take pills:  Close toilet bowl lid and flush twice after use  Make sure small children and pets do not play in or around the toilet you use  You do not need to use special drinking glasses or utensils, normal washing will remove the chemotherapy waste products  Condoms should be used during sexual intercourse to protect your partner  Oral sex should be avoided; kissing and other forms of affection are safe    Fertility and Sexuality:    Fertility is the ability to have a child. Some chemotherapy drugs can affect a man's and/or woman's fertility. Your cancer care team can talk to you about your risks and about options to preserve your fertility    The way  you feel about having sex can change when having cancer treatment. Your cancer care team can give you more information and answer your questions   It is recommended that you do not become pregnant (or impregnate someone) while receiving chemotherapy and for some time once treatment has finished. Your cancer care team can tell you how long you need to practice birth control  Even if your periods stop during chemotherapy, you can be still produce eggs and get pregnant. Chemotherapy drugs could harm the baby-you should always use a reliable form of birth control    Support Services:    Social Work: Associate Professor Services:  [phone]   Offering: yoga, massage, acupuncture, creative arts, cooking classes, Actuary, exercise consultations, Digna Fraise, and meditation  Nutritionist: Appointments are available at the [employer], [facility], or [facility]. Please call to schedule:  [facility]: [phone]  [facility]: [phone]  [facility]: [phone]    Local Resources:  [place] [facility] [place]: [phone]  American Cancer Society: [phone]  [facility] [place]: [phone]      Helpful Resources:    Cancer Care    https://www.cancercare.org/   American Cancer Society  www.cancer.org  [employer]  www.cancer.gov  Leukemia/Lymphoma society www.leukemia-lymphoma.org  [employer] Community       http://www.cancersupportcommunity.net/      Important:    It is important that your treatments to stay on time. If an emergency or unexpected issue happens and you can't make it to an appointment please call us  as soon as possible.    Normal business hours are Monday - Friday, 8-4pm  An after hours answering service is available by calling our main numbers.      For non-urgent questions, you can contact us  by using MyChart. Please know that messages are only checked on during normal business  hours.   For all urgent issues please call the office.

## 2023-07-03 NOTE — ED Notes (Signed)
 Pt Ambulatory Spo2 94% and HR 92.

## 2023-07-03 NOTE — Progress Notes (Addendum)
 Pt arrived to infusion center for C1 Taxol/Carbo (LT #7). ( Pt received Taxol/Carbo In [place]  a few years ago, pt believes she got 6 doses total. )  Consent updated today, 07/03/23- to be scanned into chart. Hand off reviewed. Labs within beacon parameters for treatment today. VSS. P IV placed in L hand-. Blood return pre and post infusion. Education completed on Caring for yourself after Chemo with Patient and husband, paperwork given to husband prior to his leaving. Pt tolerated infusion Completed Taxol infusion.     ~12:46 Upon completion of taxol, pt reported needing to use the bathroom urgently and referenced her stomach bothering her. After carboplatin was hung Patient stood, and lost consciousness. Staff present during transfer, guided her back into chair without injury. Carbo paused at this time (approx 21ccs)Vitals obtained- Lindsey Rhodes, PA at bedside, patient pale and regained consciousness. Reported feeling dizzy when she stood. Reported no PO intake during treatment- given of juice and ate 1/4 of sandwhich. Pt able to follow commands with improved color in face.     ~12:55 Pt stand pivot to commode, tolerated transfer. At time of sitting down, pt became grey, cold, and started to loss consciousness. She was hypotensive (60/40) and hypoxic (85%). Oxygen therapy with non rebreather mask- improved saturations. Pt responsive only to painful stimuli at this time. Rescue medications given (See MAR). MERT response was called. The patient continue to experience a lack of consciousness, needing physical stimuli- unable to obtain a blood pressure, epi x2 given. Pt not needing physical stimuli at time of transfer, able to follow commands but without stabilization of BP. The patient was transported to the ED in a stretcher for further evaluation, stabilization, and symptom management.     Pt remains accessed.

## 2023-07-03 NOTE — Progress Notes (Signed)
 Infusion Reaction Note 07/03/2023  Site: [facility]   Medication: Carboplatin     Chief Complaint: Fainting upon standing    History of Present Illness:  Julie Walsh is a 73 y.o. 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     Approximately 1 minutes (~21 ml received) into Carboplatin  infusion she reports abdominal pain and requested to use the restroom. Upon standing the patient fainted. Infusion was paused at the onset of symptoms. She expressed she had not eaten today. Vital signs stable (103/66, HR 85, SpO2: 94%). Provided the patient juice and a sandwich. Her symptoms began to improve and after 10 minutes she requested to use the bathroom. A bedside commode was chairside and during transfer the patient began experiencing lightheaded and dizziness. She was hypotensive (60/40) and hypoxic (85%). Oxygen therapy via nasal cannula transition to non rebreather mask. Rescue medications including 125 mg IV solumedrol, 50 mg IV benadryl, 20 mg IV Pepcid , and IM epinephrine  were given. A MERT response was called. The patient continue to experience a lack of consciousness, unable to obtain a blood pressure, and an additional IM epinephrine  was given. The patient was transported to the ED for further evaluation, stabilization, and symptom management.    The summary of the reaction symptoms, rescue medications/interventions, response to treatment, and whether or not the infusion was resumed and completed is below.     Review of Symptoms:  Pertinent positives reviewed above. Other systems negative.     Physical Exam:   General: Unresponsiveness  Skin: Julie Walsh     Vitals:    Day 1,  Cycle 1  07/03/23 00:00 Day 1,  Cycle 1  07/03/23 08:16 Day 1,  Cycle 1  07/03/23 08:20 Day 1,  Cycle 1  07/03/23 09:40 Day 1,  Cycle 1  07/03/23 12:45 Day 1,  Cycle 1  07/03/23 12:51 Day 1,  Cycle 1  07/03/23 13:01 Day 1,  Cycle 1  07/03/23 13:02 Day 1,  Cycle  1  07/03/23 13:04 Day 1,  Cycle 1  07/03/23 13:24   Height          152.4 cm (5')   Height Method          Stated   Weight  68.2 kg (150 lb 4.8 oz)        68 kg (150 lb)   Weight Method          Stated   BSA (Calculated - sq m)          1.7 sq meters   BP  124/64    103/66 60/40 (L)   122/76   Heart Rate  76    85 90   88   SpO2  98 %    94 % 85 % (L) 88 % (L) 97 % 99 %   Temp  36.8 C (98.2 F)        35.6 C (96.1 F)   Pain Score  2           *Has patient (or family) completed any of the following? (select all that apply)   None completed          Day, Cycle Day 1, Cycle 1            CARBOplatin (PARAPLATIN) IV     341.5 mg        PACLitaxel (TAXOL) IV    175 mg/m2 =  300 mg         sodium thiosulfate 250 MG/ML SC [ 0.4 g ]            (L): Data is abnormally low    Infusion Reaction:     Grade 1-2 (Mild to Moderate) infusion reaction noted by the following symptoms:  - Abdominal discomfort    Grade 3 (Severe) infusion reaction noted by the following symptoms:  - Severe Hypotension not responsive to fluids alone  - Hypoxia (requiring high-flow oxygen)  - Severe Lightheaded feeling/dizziness  - Altered level of consciousness  - Hospitalization being considered for clinical sequelae  - Cyanosis    Interventions:    Infusion was paused immediately at onset of symptoms for further assessment.    Provider was notified.    Vital signs obtained and found to be changed from baseline    Rescue medications were administered for symptom management as follows:  - Methylprednisolone 125 mg IV  - Diphenhydramine 50 mg IV  - Famotidine  20 mg IV  - Epinephrine  0.3 mg IM in the right anterolateral thigh  - Epinephrine  0.3 mg IM in the left anterolateral thigh    Oxygen administered via nasal canula as needed to get SpO2 to >92%. and administered via nonrebreather due to severe hypoxia.    Emergency Response System was activated per facility protocol.    Infusion was discontinued.    Disposition:    Patient was transferred to the  emergency department for further monitoring.    Impression and Plan:  Julie Walsh was seen today for an infusion reaction to Carboplatin infusion (Lifetime cycle 7), as previously described.    - The patient was transported to the ED for further evaluation, stabilization, and symptom management.    A copy of this note is being sent to the primary oncology team to keep them informed and for future treatment planning.       Electronically signed on 07/03/23 at 1:22 PM  Paulene Boron, PA

## 2023-07-03 NOTE — ED Notes (Signed)
 Report Given To  Henriette Lofty RN      Descriptive Sentence / Reason for Admission     Chief Complaint/Presenting Symptoms: Pt was the MERT response to the ED for abdominal pain, pt got up to use the bathroom when she shad a syncopal episode. Pt was only 5 minutes into her chemo infusion. Pt was given epi x2, benadryl,, Pepcid  and solumedrol while in the infusion center. Pt is minimally responsive and placed on a non re breather, pale and diaphoretic.     Admitting Diagnosis:         Active Issues / Relevant Events     Orientation Status: x4    Ambulates with: walker    Skin/wounds/specialty bed needs:     Precautions: fall    O2 needs: RA    PO status/How pt takes pills: WWW    Any meds or valuables with patients:     Psychosocial/Behavior/1:1 needs:     Mode of Transport to IP (pt is in stretcher/wheelchair/IP bed):         To Do List  Labs      Anticipatory Guidance / Discharge Planning
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
## 2023-07-04 ENCOUNTER — Telehealth: Payer: Self-pay | Admitting: Obstetrics & Gynecology

## 2023-07-04 NOTE — Telephone Encounter (Signed)
 Pt calling to speak with Julie Walsh in regards to issue with chemo 6/3. Unable to get Carbo part due to being unable to hold herself up and getting lightheaded. Asking for call back- Verified number.

## 2023-07-04 NOTE — Telephone Encounter (Signed)
 Returned call to [facility] who reports she is feeling well with no residual side effects for reaction yesterday during chemotherapy infusion. Instructed her that she will be getting a call from the nurse practitioner to review plan with future chemotherapy infusions.  [name] verbalized understanding and was appreciative of call.

## 2023-07-04 NOTE — Telephone Encounter (Signed)
 Called patient to follow up after syncopal episode in infusion yesterday. Julie Walsh reports that she is feeling better today. Discussed that Dr. Felipe Horton does not feel that syncopal episode was related to chemotherapy. Julie Walsh agreed and shared that this past March, she had a similar episode and was diagnosed with pneumonia. Of note, she was diagnosed with pneumonia while in the ED yesterday.    Julie Walsh shared that she did not eat yesterday prior to chemotherapy (at her appointment last week with Dr. Felipe Horton she also did not eat prior and showed signs of hypoglycemia - was provided apple juice and chocolate with improvement of symptoms). Discussed that Julie Walsh should go and see her PCP for further workup, she is agreeable and will call tomorrow.     Discussed plan of continuing with chemotherapy as planned. Strongly encouraged [name] to eat prior to infusion. Will check BG at infusion as well.     Julie Walsh is in agreement with plan, all questions answered.

## 2023-07-05 LAB — UNMAPPED LAB RESULTS
Hematocrit (HT): 35 % (ref 34–47)
Hemoglobin (HGB) (HT): 11.6 g/dL (ref 11.5–16.0)
MCHC (HT): 32.8 g/dL (ref 32.0–36.0)
MCV (HT): 92.9 fL (ref 81.0–99.0)
Mean Corpuscular Hemoglobin (MCH) (HT): 30.4 pg (ref 26.0–34.0)
Platelets (HT): 179 10 3/uL (ref 150–450)
RBC (HT): 3.81 10 6/uL (ref 3.80–5.20)
RDW (HT): 13.4 % (ref 11.5–15.0)
WBC (HT): 12 10 3/uL — ABNORMAL HIGH (ref 4.0–10.8)

## 2023-07-05 LAB — INHIBIN B: Inhibin B: 74 pg/mL — ABNORMAL HIGH (ref <=11)

## 2023-07-11 ENCOUNTER — Encounter: Payer: Self-pay | Admitting: Colon and Rectal Surgery

## 2023-07-11 NOTE — H&P (View-Only) (Signed)
 [facility]  Julie Walsh, M.D.     Julie Walsh, M.D.     Julie Walsh, M.D.       Julie Walsh, M.D.     Julie Walsh, M.D.       Julie Walsh, M.D.     Julie Walsh, M.D.     Julie Walsh, M.D.           Julie KellyACCT#: [id number]  DOB: Nov 02, 1952Sex: female  Date of Visit: 06/11/25Age: 73 years           PCP: Julie Crochet, MD  CARE TEAM: Julie L. Smith DO  Julie Crochet MD     COLORECTAL SURGERY POST-OP      Interval History: Julie Walsh is a 73 yo F here for post-op follow-up of ovarian cancer.  She has started chemotherapy.  She was diagnosed pneumonia and started on antibiotics.  She was subsequently diagnosed with CDIFF.  She has 4 days left of treatment for that.  She has been eating yogurt.  She notes stools are 3-4/day and loose.  They are starting to thicken up.   She notes a fall recently too.      PMH:  Health Maintenance:  Colonoscopy - (2023)  Patient States Done. Done by Dr. Ian Maine   Medical Problems:  Cervical Cancer, Melanoma, Ovarian Cancer  Surgical Hx:  Oophorectomy, Ileostomy, Exploratory Laparotomy, Segmental Small Bowel Resection  Reviewed and updated.  FH:  Paternal Uncle 1:  Melanoma - (age 48 Years).  Maternal Aunt 1:  Stomach Cancer.  Reviewed, no changes.  SH:  Personal Habits:  Tobacco Use: Patient is a former smoker - Quit 2010.Alcohol: Denies alcohol use.Drug Use: Denies Drug Use.  Reviewed, no changes.Date: 07/11/2023  Was the patient queried about smoking behavior? Yes  No  Does the patient currently smoke?     Tobacco Use: Patient is a former smoker - Quit 2010.E-Cigarette Use: Negative For Vaping.      ROS:  Const: Denies constitutional symptoms.  Eyes: Denies symptoms other than stated above.  CV: Denies symptoms other than stated above.  Resp: Denies symptoms other than stated above.  GI: Denies symptoms other than stated above.  GU: Denies urinary symptoms other than stated above.  Musculo: Denies symptoms other than stated above.  Neuro:  Denies symptoms other than stated above.  Psych: Denies symptoms other than stated above.  Hema/Lymph: Denies hematologic symptoms other than stated above.    Meds Prior to Visit:  [employer] 8HR Arthritis Pain Relief  650 mg  1 x daily  D3  50 mcg (2000 Ut)  daily  Eliquis   5 mg   Famotidine   20 mg  take 1 tablet by mouth everyday at bedtime  Gabapentin   300 mg  take 1 capsule by mouth everyday at bedtime  Gabapentin   100 mg   Letrozole   2.5 mg   Metronidazole   500 mg  disp: 3 (three) sig: take 1 tablet by mouth at 1:00pm, 7:00pm and 11:00pm the day before surgery  Neomycin Sulfate  500 mg  disp: 6 (six) sig: take 2 tablets by mouth at 1:00pm, 7:00pm and 11:00pm the day before surgery  Pantoprazole  Sodium  40 mg   Trazodone  HCL  100 mg  take 1 tablet by mouth at bedtime for insomnia  Triamcinolone Acetonide  0.1 %      Allergies:  NKDA, No Allergy To Latex, No Food Allergies    BP: 123/80 Pulse: 78 T: 97.2 Resp: 16 Ht:  60 5'0 Wt: 154lb Wt Prior: 150lb as of 05/14/23 Wt Dif: +4lb Wt kg: 69.854 Wt kg Prior: 68.040 as of 05/14/23 Wt kg Dif: +1.814 BMI: 30.1 Pain Level: 3/10 abdominal, very drained    Exam was done with a nurse and/or acceptable chaperone present.  Exam:  Const: Appears healthy and well developed. No signs of acute distress present. Speech is clear and appropriate.  Abdomen: The abdomen is nondistended. Abdominal scar: midline laparotomy scar. Abdomen is benign. No abdominal masses palpable. Abdominal wall is soft. No palpable hernias. No colostomy. No ileostomy.  Musculo: Walks with a slow gait.  Neuro: Alert and oriented x3. Affect is normal. No focal deficits appreciated.             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 Felipe Horton.      Assessment #2: Hx A04.72 Enterocolitis d/t Clostridium difficile, not spcf as recur   Care Plan:             Recommendations:  Finish off  antibiotics     Assessment #3: Hx Z71.3 Dietary counseling and surveillance   Care Plan:             Recommendations:  Diet as tolerated - cook foods or peel.     Plan Other: Hx             Med Current    :  D3 50 mcg (2000 Ut)                      Neomycin Sulfate 500 mg                      Metronidazole  500 mg                       Seen by:  Julie Bower, MD

## 2023-07-11 NOTE — H&P (Signed)
 [facility]  Julie Walsh, M.D.     Nicola Barre, M.D.     Steven Ognibene, M.D.       Jarrell Merritts, M.D.     Sarajane Cumming, M.D.       Peggyann Bower, M.D.     Candice Chalet, M.D.     Sophie Dutch, M.D.           Aracelia KellyACCT#: [id number]  DOB: Nov 02, 1952Sex: female  Date of Visit: 06/11/25Age: 72 years           PCP: Clarene Crochet, MD  CARE TEAM: Ashlee L. Smith DO  Clarene Crochet MD     COLORECTAL SURGERY POST-OP      Interval History: Julie Walsh is a 73 yo F here for post-op follow-up of ovarian cancer.  She has started chemotherapy.  She was diagnosed pneumonia and started on antibiotics.  She was subsequently diagnosed with CDIFF.  She has 4 days left of treatment for that.  She has been eating yogurt.  She notes stools are 3-4/day and loose.  They are starting to thicken up.   She notes a fall recently too.      PMH:  Health Maintenance:  Colonoscopy - (2023)  Patient States Done. Done by Dr. Ian Maine   Medical Problems:  Cervical Cancer, Melanoma, Ovarian Cancer  Surgical Hx:  Oophorectomy, Ileostomy, Exploratory Laparotomy, Segmental Small Bowel Resection  Reviewed and updated.  FH:  Paternal Uncle 1:  Melanoma - (age 48 Years).  Maternal Aunt 1:  Stomach Cancer.  Reviewed, no changes.  SH:  Personal Habits:  Tobacco Use: Patient is a former smoker - Quit 2010.Alcohol: Denies alcohol use.Drug Use: Denies Drug Use.  Reviewed, no changes.Date: 07/11/2023  Was the patient queried about smoking behavior? Yes  No  Does the patient currently smoke?     Tobacco Use: Patient is a former smoker - Quit 2010.E-Cigarette Use: Negative For Vaping.      ROS:  Const: Denies constitutional symptoms.  Eyes: Denies symptoms other than stated above.  CV: Denies symptoms other than stated above.  Resp: Denies symptoms other than stated above.  GI: Denies symptoms other than stated above.  GU: Denies urinary symptoms other than stated above.  Musculo: Denies symptoms other than stated above.  Neuro:  Denies symptoms other than stated above.  Psych: Denies symptoms other than stated above.  Hema/Lymph: Denies hematologic symptoms other than stated above.    Meds Prior to Visit:  CVS 8HR Arthritis Pain Relief  650 mg  1 x daily  D3  50 mcg (2000 Ut)  daily  Eliquis   5 mg   Famotidine   20 mg  take 1 tablet by mouth everyday at bedtime  Gabapentin   300 mg  take 1 capsule by mouth everyday at bedtime  Gabapentin   100 mg   Letrozole   2.5 mg   Metronidazole   500 mg  disp: 3 (three) sig: take 1 tablet by mouth at 1:00pm, 7:00pm and 11:00pm the day before surgery  Neomycin Sulfate  500 mg  disp: 6 (six) sig: take 2 tablets by mouth at 1:00pm, 7:00pm and 11:00pm the day before surgery  Pantoprazole  Sodium  40 mg   Trazodone  HCL  100 mg  take 1 tablet by mouth at bedtime for insomnia  Triamcinolone Acetonide  0.1 %      Allergies:  NKDA, No Allergy To Latex, No Food Allergies    BP: 123/80 Pulse: 78 T: 97.2 Resp: 16 Ht:  60 5'0 Wt: 154lb Wt Prior: 150lb as of 05/14/23 Wt Dif: +4lb Wt kg: 69.854 Wt kg Prior: 68.040 as of 05/14/23 Wt kg Dif: +1.814 BMI: 30.1 Pain Level: 3/10 abdominal, very drained    Exam was done with a nurse and/or acceptable chaperone present.  Exam:  Const: Appears healthy and well developed. No signs of acute distress present. Speech is clear and appropriate.  Abdomen: The abdomen is nondistended. Abdominal scar: midline laparotomy scar. Abdomen is benign. No abdominal masses palpable. Abdominal wall is soft. No palpable hernias. No colostomy. No ileostomy.  Musculo: Walks with a slow gait.  Neuro: Alert and oriented x3. Affect is normal. No focal deficits appreciated.             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 Felipe Horton.      Assessment #2: Hx A04.72 Enterocolitis d/t Clostridium difficile, not spcf as recur   Care Plan:             Recommendations:  Finish off  antibiotics     Assessment #3: Hx Z71.3 Dietary counseling and surveillance   Care Plan:             Recommendations:  Diet as tolerated - cook foods or peel.     Plan Other: Hx             Med Current    :  D3 50 mcg (2000 Ut)                      Neomycin Sulfate 500 mg                      Metronidazole  500 mg                       Seen by:  Peggyann Bower, MD

## 2023-07-16 ENCOUNTER — Telehealth: Payer: Self-pay

## 2023-07-16 ENCOUNTER — Other Ambulatory Visit
Admission: RE | Admit: 2023-07-16 | Discharge: 2023-07-16 | Disposition: A | Discharge status: Home / Self Care | Source: Ambulatory Visit

## 2023-07-16 DIAGNOSIS — N289 Disorder of kidney and ureter, unspecified: Secondary | ICD-10-CM | POA: Insufficient documentation

## 2023-07-16 LAB — URINALYSIS WITH REFLEX TO CULTURE
Glucose,UA: NEGATIVE
Ketones, UA: NEGATIVE
Nitrite,UA: NEGATIVE
Protein,UA: NEGATIVE
Specific Gravity,UA: 1.008 (ref 1.002–1.030)
pH,UA: 7 (ref 5.0–8.0)

## 2023-07-16 LAB — URINE MICROSCOPIC (IQ200): Hyaline Casts,UA: NONE SEEN (ref 0–5)

## 2023-07-16 NOTE — Telephone Encounter (Signed)
 Pre-call complete. Patient verbalized understanding of pre-procedure instructions. MyChart message sent, see below.    Your arrival time for your Mediport Placement is: 2:15pm on Monday July 23, 2023    Your appointment is scheduled at the [facility] ([address] [place], [place] [address]).  Upon entering building, go into Main Lobby to the right and Check In at the [employer].    You will not need any blood work.    You should have someone spend the night with you after your procedure.    You may have clear liquids (water , apple juice, ginger-ale, coffee or tea with no cream) up to 2 hours before your procedure. Clear liquids are allowed until 1pm.    Nothing to eat for 8 hours before procedure.    Please hold the following medications: None     Please take all prescribed medications the morning of your procedure.     Hold all vitamins and supplements the day of your procedure. You may resume them post-procedure.    You will receive sedation for your procedure, and you must have someone to drive you home afterwards. This driver cannot be a ride share [place], [place] or non-medical taxi) unless you have someone accompany you. If you arrive without someone to drive you home, your procedure may be delayed or cancelled, at the discretion of the provider.    If you need to reschedule this appointment, or if you have any questions AFTER your procedure, please call [phone].    If you have any questions PRIOR to your procedure, please call the Patient Experience Coordinator, RN at [phone].

## 2023-07-18 NOTE — Progress Notes (Signed)
 CC: Post Operative Appointment    HPI:  Julie Walsh 73 y.o. female presents today for pre chemo visit. On 06/05/2023 she underwent:    1. Radical resection recurrent ovarian malignancy: exploratory laparotomy, removal of small bowel, peritoneal implants, removal of portion of omentum, small bowel resection with reanastomosis, lysis of adhesions with oversewing serotomy, optimal microscopic cytoreduction at the end of the case     FINAL PATHOLOGY:   A. Small bowel, implant, biopsy:   -Granulosa cell tumor.    B. Transverse colon, implant, biopsy:   -Granulosa cell tumor.    C. Pelvic, small bowel implant, biopsy:   -Benign adipose tissue with hemorrhage.    D. Small bowel implants #2, biopsy:   -Granulosa cell tumor.    E. Omentum, partial omentectomy:   -Adipose tissue involved by scattered small foci of granulosa cell tumor.    F. Colonic implant, biopsy:   -Fibroadipose tissue involved by scattered small foci of atypical  cells, suspicious for granulosa cell tumor.    G. Soft tissue, right upper quadrant mass, resection:   -Two benign lymph nodes (0/2).   -Benign adipose tissue.    H. Small bowel, mass, resection:   -Granulosa cell tumor, with extensive intratumoral hemorrhage and cystic formation, predominantly involving small intestinal serosa/subserosa and muscularis propria.   -Background small intestinal mucosa with mild intraepithelial lymphocytosis.   -Resection margins negative for tumor.   -See comment.    Julie Walsh received C1 Taxol /Carbo on 07/03/2023, however experienced syncopal episode. MERT response called and patient transported to ED. While in ED she was diagnosed with pneumonia. Of note, patient also admitted to not eating prior to chemotherapy appointment. Today she reports she is doing well, but she is experiencing numbness and tingling in her fingers/hands. Julie notes having difficulty crocheting due to loss of ability to feel the yarn, as well as dropping things due to numbness. Denies  sensation of numbness/tingling in feet or toes. Denies fevers/chills, nausea vomiting, chest pain, abdominal pain, or changes in bowel/bladder function.     Julie does report significant bone pain after last treatment. Her pain was not well controlled with Tylenol  so she took oxycodone  from previous abdominal surgery. Bone pain is better today.       PE:  BP 124/80 (BP Location: Left arm, Patient Position: Sitting, Cuff Size: adult)   Pulse 94   Temp 36.2 C (97.1 F) (Temporal)   Resp 18   Ht 152.4 cm (5')   Wt 69.9 kg (154 lb)   SpO2 96%   BMI 30.08 kg/m     General:  Pleasant, well nourished, NAD, A&O x 3  Cardiac: regular rate and rhythm   Respiratory: lungs clear to auscultation, no wheezing or rhonchi   Abdomen:  Soft and nontender, bowel sounds present, surgical scar intact       Lower extremities:  Non-tender and non-edematous    Impression:    - Final pathology: recurrent Granulosa cell tumor  - Tumor Board Recommendations: Carbo/Taxol     Plan:  1. Granulosa cell tumor:   - C2 Carbo/Taxol  pending labs     2. Neuropathy   - B6 and L-glutamine   - Will reach out to primary team regarding possible dose reduction     3. Bone Pain   - Discontinue oxycodone  use   - Start Claritin daily for pain   - Continue Tylenol      Evalene Grieve, NP

## 2023-07-19 ENCOUNTER — Other Ambulatory Visit: Payer: Self-pay

## 2023-07-20 ENCOUNTER — Ambulatory Visit: Attending: Obstetrics and Gynecology | Admitting: Obstetrics and Gynecology

## 2023-07-20 ENCOUNTER — Telehealth: Payer: Self-pay

## 2023-07-20 ENCOUNTER — Encounter: Payer: Self-pay | Admitting: Obstetrics and Gynecology

## 2023-07-20 VITALS — BP 124/80 | HR 94 | Temp 97.1°F | Resp 18 | Ht 60.0 in | Wt 154.0 lb

## 2023-07-20 DIAGNOSIS — M898X9 Other specified disorders of bone, unspecified site: Secondary | ICD-10-CM | POA: Insufficient documentation

## 2023-07-20 DIAGNOSIS — G629 Polyneuropathy, unspecified: Secondary | ICD-10-CM | POA: Insufficient documentation

## 2023-07-20 DIAGNOSIS — Z5111 Encounter for antineoplastic chemotherapy: Secondary | ICD-10-CM | POA: Insufficient documentation

## 2023-07-20 DIAGNOSIS — D3910 Neoplasm of uncertain behavior of unspecified ovary: Secondary | ICD-10-CM | POA: Insufficient documentation

## 2023-07-20 MED ORDER — LORATADINE 10 MG PO TABS *I*
10.0000 mg | ORAL_TABLET | Freq: Every day | ORAL | 3 refills | Status: DC
Start: 2023-07-20 — End: 2023-08-13

## 2023-07-20 NOTE — Patient Instructions (Signed)
 Vitamin B6  L-glutamine

## 2023-07-20 NOTE — Telephone Encounter (Signed)
 Reminder call attempted 6/20, left message regarding arrival time, NPO status and to have a driver.    Julie DELENA Peacemaker, LPN

## 2023-07-22 ENCOUNTER — Other Ambulatory Visit: Payer: Self-pay

## 2023-07-23 ENCOUNTER — Ambulatory Visit
Admission: RE | Admit: 2023-07-23 | Discharge: 2023-07-23 | Disposition: A | Discharge status: Home / Self Care | Source: Ambulatory Visit | Attending: Radiology | Admitting: Radiology

## 2023-07-23 ENCOUNTER — Other Ambulatory Visit
Admission: RE | Admit: 2023-07-23 | Discharge: 2023-07-23 | Disposition: A | Discharge status: Home / Self Care | Source: Ambulatory Visit

## 2023-07-23 ENCOUNTER — Other Ambulatory Visit: Payer: Self-pay | Admitting: Oncology

## 2023-07-23 ENCOUNTER — Other Ambulatory Visit: Payer: Self-pay

## 2023-07-23 ENCOUNTER — Other Ambulatory Visit: Payer: Self-pay | Admitting: Obstetrics and Gynecology

## 2023-07-23 ENCOUNTER — Ambulatory Visit: Admitting: Obstetrics and Gynecology

## 2023-07-23 ENCOUNTER — Encounter: Payer: Self-pay | Admitting: Obstetrics & Gynecology

## 2023-07-23 DIAGNOSIS — D3910 Neoplasm of uncertain behavior of unspecified ovary: Secondary | ICD-10-CM | POA: Insufficient documentation

## 2023-07-23 DIAGNOSIS — Z87891 Personal history of nicotine dependence: Secondary | ICD-10-CM | POA: Insufficient documentation

## 2023-07-23 DIAGNOSIS — Z452 Encounter for adjustment and management of vascular access device: Secondary | ICD-10-CM | POA: Insufficient documentation

## 2023-07-23 DIAGNOSIS — Z8543 Personal history of malignant neoplasm of ovary: Secondary | ICD-10-CM | POA: Insufficient documentation

## 2023-07-23 LAB — APTT: aPTT: 33.6 s (ref 25.8–37.9)

## 2023-07-23 LAB — CBC AND DIFFERENTIAL
Baso # K/uL: 0.1 10*3/uL (ref 0.0–0.2)
Eos # K/uL: 0.2 10*3/uL (ref 0.0–0.5)
Hematocrit: 36 % (ref 34–49)
Hemoglobin: 11.1 g/dL — ABNORMAL LOW (ref 11.2–16.0)
IMM Granulocytes #: 0.1 10*3/uL — ABNORMAL HIGH
IMM Granulocytes: 1.2 %
Lymph # K/uL: 1.8 10*3/uL (ref 1.0–5.0)
MCV: 97 fL (ref 75–100)
Mono # K/uL: 0.8 10*3/uL (ref 0.1–1.0)
Neut # K/uL: 4 10*3/uL (ref 1.5–6.5)
Nucl RBC # K/uL: 0 10*3/uL
Nucl RBC %: 0 /100{WBCs} (ref 0.0–0.2)
Platelets: 308 10*3/uL (ref 150–450)
RBC: 3.7 MIL/uL — ABNORMAL LOW (ref 4.0–5.5)
RDW: 13.9 % (ref 0.0–15.0)
Seg Neut %: 58.5 %
WBC: 6.9 10*3/uL (ref 3.5–11.0)

## 2023-07-23 LAB — COMPREHENSIVE METABOLIC PANEL
ALT: 33 U/L (ref 0–35)
AST: 27 U/L (ref 0–35)
Albumin: 3.7 g/dL (ref 3.5–5.2)
Alk Phos: 94 U/L (ref 35–105)
Anion Gap: 16 (ref 7–16)
Bilirubin,Total: 0.3 mg/dL (ref 0.0–1.2)
CO2: 22 mmol/L (ref 20–28)
Calcium: 9.1 mg/dL (ref 8.6–10.2)
Chloride: 103 mmol/L (ref 96–108)
Creatinine: 1.16 mg/dL — ABNORMAL HIGH (ref 0.51–0.95)
Glucose: 108 mg/dL — ABNORMAL HIGH (ref 60–99)
Lab: 8 mg/dL (ref 6–20)
Potassium: 3.9 mmol/L (ref 3.3–5.1)
Sodium: 141 mmol/L (ref 133–145)
Total Protein: 6.4 g/dL (ref 6.3–7.7)
eGFR BY CREAT: 49 — AB

## 2023-07-23 LAB — CA 125: CA 125(eff 4-2010): 23 U/mL (ref 0–34)

## 2023-07-23 MED ORDER — FENTANYL CITRATE 50 MCG/ML IJ SOLN *WRAPPED*
Freq: Once | INTRAMUSCULAR | Status: AC | PRN
Start: 2023-07-23 — End: 2023-07-23
  Administered 2023-07-23: 50 ug via INTRAVENOUS

## 2023-07-23 MED ORDER — MIDAZOLAM HCL 1 MG/ML IJ SOLN *I* WRAPPED
Freq: Once | INTRAMUSCULAR | Status: AC | PRN
Start: 2023-07-23 — End: 2023-07-23
  Administered 2023-07-23: 1 mg via INTRAVENOUS

## 2023-07-23 MED ORDER — LIDOCAINE HCL 1 % IJ SOLN *I*
Freq: Once | INTRAMUSCULAR | Status: AC | PRN
Start: 2023-07-23 — End: 2023-07-23
  Administered 2023-07-23: 9 mL via SUBCUTANEOUS

## 2023-07-23 MED ORDER — CEFAZOLIN SODIUM 1000 MG IJ SOLR *I*
Freq: Once | INTRAMUSCULAR | Status: AC | PRN
Start: 2023-07-23 — End: 2023-07-23
  Administered 2023-07-23: 1 g via INTRAVENOUS

## 2023-07-23 MED ORDER — LIDOCAINE-EPINEPHRINE 1 %-1:100000 IJ SOLN *I*
Freq: Once | INTRAMUSCULAR | Status: AC | PRN
Start: 2023-07-23 — End: 2023-07-23
  Administered 2023-07-23: 1 mL via SUBCUTANEOUS

## 2023-07-23 NOTE — Discharge Instructions (Signed)
 Interventional Radiology Mediport Placement Discharge Instructions    07/23/2023             3:08 PM     Provider performing test/procedure:Dr. Prentice Hilts    Patient given Medication: Yes. You have been given medicine, Versed and Fentanyl , that may make you sleepy. Do not drive, operate heavy machinery, drink alcoholic beverages, make important personal or business decisions, or sign legal documents until the next day.    Implant card given to patient Yes    PATIENT INSTRUCTIONS  After the surgery, the incision will be covered with a gauze dressing. This dressing may be removed after 5 days.     Under the dressing you will find several thin paper tapes (steri-strips) covering the incision. The steri-strips should be left in place.  The stitches are underneath the skin and will dissolve.     You may shower in 5 days, once the dressing is removed.    Do not scrub the incision. Simply let the water  run over it and then gently pat it dry.      Do not submerge the incision for 1 week (no baths, hot tubs or swimming pools).    If the port is accessed (a needle in place) at the time of placement, do not remove the needle. This will be done by the cancer/infusion center. It must be removed within 3 days.  Once the needle is removed, you may follow the above instructions.    You may resume your regular diet with your doctor's permission.    You may resume all your medications as directed by your doctor, including blood thinners.    COMFORT MEASURES  For the first few days, it is common for the area around the incision to be swollen, discolored (black & blue), and sore. To help reduce swelling, apply an ice pack to the swollen area for 15 to 20 minutes every hour for 3 days or more as needed.     Wear loose, comfortable clothing.     We recommend the use of over-the-counter anti-inflammatory medication such as ibuprofen (Advil, Motrin, Aleve), if not allergic,  to minimize swelling, inflammation, and pain. Take medication  every 4 - 6 hours with food.    ACTIVITY  Lifting with arm on side of mediport should be restricted to 10-15 pounds or less for 2 weeks.   Avoid pushing, pulling, straining, or any strenuous activity.   You may climb stairs.  You may drive if not using narcotic pain medication and if you are comfortable enough behind the wheel and can safely operate the vehicle.    RETURN TO WORK  You may return to work, when you feel you are ready at any point after surgery if otherwise cleared by your doctor.    WHEN TO CALL   If you develop a fever (temperature greater than 101 degrees), shaking chills, bleeding, increasing redness or drainage around the site.    Please call [facility] for questions or concerns regarding your procedure. Monday-Friday from 8am-5pm call [phone]. After hours and weekends you may call [phone] (be sure to select OPTION 2) to be connected to our Answering Service.  The Emergency Department is open 24 hours a day if emergency treatment is required.   If your procedure involved collection and analysis of body fluids or tissue, you may see the results in MyChart. If you have questions about the results, please contact the health care provider who requested that you have  this procedure done.

## 2023-07-23 NOTE — Invasive Procedure Plan of Care (Signed)
 CONSENT FOR MEDICAL  OR SURGICAL PROCEDURE                            Patient Name: Julie Walsh  [facility] [address]                                                              DOB: 07-02-1950         Please read this form or have someone read it to you.   It's important to understand all parts of this form. If something isn't clear, ask us  to explain.   When you sign it, that means you understand the form and give us  permission to do this surgery or procedure.     I agree for Lyndell Prentice PARAS, MD , and Vascular and Interventional Radiology Attendings, Fellows, Residents and Advanced Practice Providers along with any assistants* they may choose, to treat the following condition(s): Need for venous access.   By doing this surgery or procedure on me: Placement of a mediport.   This is also known as: Placement of a subcutaneous venous port.   Laterality:     *if you'd like a list of the assistants, please ask. We can give that to you.    1. The care provider has explained my condition to me. They have told me how the procedure can help me. They have told me about other ways of treating my condition. I understand the care provider cannot guarantee the result of the procedure. If I don't have this procedure, my other choices are: Not to perform the procedure.    2. The care provider has told me the risks (problems that can happen) of the procedure. I understand there may be unwanted results. The risks that are related to this procedure include: Allergic reaction to dye, bleeding, shock, infection, collapse of lung, possible need for chest tube placement, catheter malposition, thrombus, mechanical phlebitis, and in very rare circumstances death.    3. I understand that during the procedure, my care provider may find a condition that we didn't know about before the treatment started. Therefore, I agree that my care provider can perform any other treatment which they think is necessary and available.    4. I give  permission to the hospital and/or its departments to examine and keep tissue, blood, body parts, fluids or materials removed from my body during the procedure(s) to aid in diagnosis and treatment, after which they may be used for scientific research or teaching by appropriate persons. If these materials are used for science or teaching, my identity will be protected. I will no longer own or have any rights to these materials regardless of how they may be used.    5. My care provider might want a representative from a medical device company to be there during my procedure. I understand that person works for:          The ways they might help my care provider during my procedure include:            6. Here are my decisions about receiving blood, blood products, or tissues. I understand my decisions cover the time before, during and after my procedure, my treatment, and my time in  the hospital. After my procedure, if my condition changes a lot, my care provider will talk with me again about receiving blood or blood products. At that time, my care provider might need me to review and sign another consent form, about getting or refusing blood.    I understand that the blood is from the community blood supply. Volunteers donated the blood, the volunteers were screened for health problems. The blood was examined with very sensitive and accurate tests to look for hepatitis, HIV/AIDS, and other diseases. Before I receive blood, it is tested again to make sure it is the correct type.    My chances of getting a sickness from blood products are small. But no transfusion is 100% safe. I understand that my care provider feels the good I will receive from the blood is greater than the chances of something going wrong. My care provider has answered my questions about blood products.    Moderate Sedation:  The moderate sedation procedure has been explained to me as outlined below and my questions/concerns regarding the sedation have  been answered/addressed to my satisfaction.    I understand that alternatives to receiving moderate sedation for this procedure include not undergoing the procedure, or undergoing the procedure without sedation. If neither of these options is acceptable, there may be other clinical options that I can discuss with my healthcare provider.   I understand that moderate sedation is a state of altered consciousness that is usually created by the administration of intravenous medications (medications I.V.). Patients under moderate sedation often feel sleepy and very relaxed, but will respond to commands. The purpose of moderate sedation is to make me comfortable during my procedure. This can also make the procedure easier to perform. I understand that while many patients undergoing moderate sedation will have little to no memory of the sedation or the procedure, awareness during the procedure is possible. Again, the goal is to make me comfortable.   I understand that I will be carefully monitored throughout my sedation and procedure to keep me safe. I will not be discharged until the effects of my sedation have worn off sufficiently for me to stay safe. I must not drive, operate dangerous machinery or make important decisions until the next day following my sedation, and I must be accompanied by a responsible adult when I go home.  I understand that no medical treatment/procedure is without risk, and this includes moderate sedation, although risks are rare. These include excessive sedation which may require special procedures to keep me safe. I understand some patients may experience nausea and vomiting after sedation. Rarely, patients develop unstable heart rhythms or breathing difficulties, which can lead to death in very rare circumstances.   By signing this document I acknowledge that I have read this consent and had the opportunity to discuss this with my healthcare provider. I also agree to allow my provider to  perform any procedure he/she deems necessary to keep me safe during my sedation.       My decision  about blood or  blood products           My decision   about tissue  Implants              I understand this  form.    My care provider  or his/her  assistants have  explained:   What I am having done and why I need it.  What other choices I can make instead of having this done.  The benefits and possible risks (problems) to me of having this done.  The benefits and possible risks (problems) to me of receiving transplants, blood, or blood products.  There is no guarantee of the results.  The care provider may not stay with me the entire time that I am in the operating or procedure room.  My provider has explained how this may affect my procedure. My provider has answered my  questions about this.         I give my  permission for  this surgery or  procedure.            _________________________________________                                     My signature  (or parent or other person authorized to sign for you, if you are unable to sign for yourself or if you are under 76 years old)             _____________     Date      (MM/DD/YYYY)           _______    Time      Electronic Signatures will display at the bottom of the consent form.    Care provider's statement: I have discussed the planned procedure, including the possibility for transfusion of blood  products or receipt of tissue as necessary; expected benefits; the possible complications and risks; and possible alternatives  and their benefits and risks with the patients or the patient's surrogate. In my opinion, the patient or the patient's surrogate  understands the proposed procedure, its risks, benefits and alternatives.              Electronically signed by: Prentice Hilts, MD                                                07/23/2023         Date        2:21 PM        Time

## 2023-07-23 NOTE — Progress Notes (Signed)
 [facility] TREATMENT HAND-OFF TOOL:   SITUATION:   Scheduled treatment category for today:     Is this a new cancer treatment?: No    New co-signed height/weight done within last 30 days: Yes    Patient seen by APP    Consent obtained:  Yes    Location:  Media    Labs complete:  Yes    Within parameters: Yes      Date of lab work: 07/23/2023    Current patient status:  Scheduled treatment with change    Comments on change:  Regimen (Consent/Date [06/28/2023]): Single Agent Carbo Q3 weeks.   Cosigned Ht/Wt: 06/28/2023  Blood Transfusion consent: 06/28/2023  Today's Cycle: C2 Single Agent Carbo- 07/25/2023   IV Access: PIV     Active issues: Recurrent Granulosa Cell Tumor    -Taxol  dropped as of C2 d/t significant neuropathy per Evalene Grieve, NP     Appts needed: Schedule C3 Single agent Carbo in 3 weeks on 08/15/23 and C4 on 09/05/23. Remind patient to get blood work Friday prior.     Today's confirmed treatment plan: C2 Single agent Carboplatin - 07/25/23

## 2023-07-23 NOTE — Procedures (Signed)
 Procedure Report           Time out documentation completed prior to procedure:  Yes    Indications/Pre-Procedure diagnosis:  Mediport    Procedure performed: IR port-a-cath (mediport) placement    Guide Wire(s) removed and inspected for integrity:Yes    Findings/Procedure Summary (detailed report located in the Image tab):     Placement of a single lumen mediport in the right IJ/    Complications:  None    Condition:  good    EBL: Minimal    Specimens:  N/A    Operators: Dr. Prentice Hilts    Disposition:  Discharge home    Post-Procedure Diagnosis: Unchanged    Prentice Hilts, MD  07/23/2023  3:24 PM

## 2023-07-23 NOTE — Interval H&P Note (Signed)
 UPDATES TO PATIENT'S CONDITION on the DAY OF SURGERY/PROCEDURE    I. Updates to Patient's Condition (to be completed by a provider privileged to complete a H&P, following reassessment of the patient by the provider):    Day of Surgery/Procedure Update:  History  History reviewed and significant changes noted: PResents for mediport placement.  No acute complaints.    Physical  Physical exam updated and significant changes noted: NAD, breathing non labored, lungs clear, RRR    Pre-Procedure Assessment  Airway Visibility: Uvula  Loose/Broken Teeth, Oral Piercings: Not Applicable  Lungs: Clear  Heart sounds: Rate: 92 bpm  Heart sounds rhythm: Regular Rate Rhythm  ASA Physical status rating: Class III: Severe systemic disease       II. Procedure Readiness   I have reviewed the patient's H&P and updated condition. By completing and signing this form, I attest that this patient is ready for surgery/procedure.    III. Attestation   I have reviewed the updated information regarding the patient's condition and it is appropriate to proceed with the planned surgery/procedure.      Prentice Hilts, MD as of 2:34 PM 07/23/2023

## 2023-07-24 ENCOUNTER — Telehealth: Payer: Self-pay | Admitting: Obstetrics and Gynecology

## 2023-07-24 ENCOUNTER — Other Ambulatory Visit: Payer: Self-pay | Admitting: Oncology

## 2023-07-24 ENCOUNTER — Other Ambulatory Visit: Payer: Self-pay | Admitting: Obstetrics and Gynecology

## 2023-07-24 ENCOUNTER — Other Ambulatory Visit: Payer: Self-pay

## 2023-07-24 MED FILL — Carboplatin IV Soln 450 MG/45ML: INTRAVENOUS | Qty: 34.9 | Status: AC

## 2023-07-24 NOTE — Progress Notes (Signed)
 Error

## 2023-07-24 NOTE — Telephone Encounter (Signed)
 Called patient to discuss treatment plan for tomorrow. No answer. VM left- per FYI okay to leave VM. Taxol  dropped for tomorrow per discussion with Dr. [name].     Evalene Grieve, NP

## 2023-07-25 ENCOUNTER — Other Ambulatory Visit: Payer: Self-pay

## 2023-07-25 ENCOUNTER — Ambulatory Visit: Attending: Oncology

## 2023-07-25 VITALS — BP 122/70 | HR 74 | Temp 98.4°F | Resp 16 | Wt 147.9 lb

## 2023-07-25 DIAGNOSIS — D3910 Neoplasm of uncertain behavior of unspecified ovary: Secondary | ICD-10-CM | POA: Insufficient documentation

## 2023-07-25 DIAGNOSIS — Z5111 Encounter for antineoplastic chemotherapy: Secondary | ICD-10-CM | POA: Insufficient documentation

## 2023-07-25 MED ORDER — DIPHENHYDRAMINE HCL 50 MG/ML IJ SOLN *I*
50.0000 mg | Freq: Once | INTRAMUSCULAR | Status: AC
Start: 2023-07-25 — End: 2023-07-25
  Administered 2023-07-25: 50 mg via INTRAVENOUS

## 2023-07-25 MED ORDER — FAMOTIDINE (PF) 20 MG/2ML IV SOLN *I*
20.0000 mg | Freq: Once | INTRAVENOUS | Status: AC
Start: 2023-07-25 — End: 2023-07-25
  Administered 2023-07-25: 20 mg via INTRAVENOUS

## 2023-07-25 MED ORDER — DEXAMETHASONE SOD PHOSPHATE PF 10 MG/ML IJ SOLN *I*
INTRAMUSCULAR | Status: AC
Start: 2023-07-25 — End: 2023-07-25
  Filled 2023-07-25: qty 2

## 2023-07-25 MED ORDER — PALONOSETRON HCL 0.25 MG/5ML IV SOLN *I*
0.2500 mg | Freq: Once | INTRAVENOUS | Status: AC
Start: 2023-07-25 — End: 2023-07-25
  Administered 2023-07-25: 0.25 mg via INTRAVENOUS

## 2023-07-25 MED ORDER — DEXTROSE 5 % IV SOLN WRAPPED *I*
349.0000 mg | Freq: Once | INTRAVENOUS | Status: AC
Start: 2023-07-25 — End: 2023-07-25
  Administered 2023-07-25: 349 mg via INTRAVENOUS
  Filled 2023-07-25: qty 34.9

## 2023-07-25 MED ORDER — PALONOSETRON HCL 0.25 MG/5ML IV SOLN *I*
INTRAVENOUS | Status: AC
Start: 2023-07-25 — End: 2023-07-25
  Filled 2023-07-25: qty 5

## 2023-07-25 MED ORDER — APREPITANT 130 MG/18ML IV EMUL *I*
130.0000 mg | Freq: Once | INTRAVENOUS | Status: AC
Start: 2023-07-25 — End: 2023-07-25
  Administered 2023-07-25: 130 mg via INTRAVENOUS

## 2023-07-25 MED ORDER — APREPITANT 130 MG/18ML IV EMUL *I*
INTRAVENOUS | Status: AC
Start: 2023-07-25 — End: 2023-07-25
  Filled 2023-07-25: qty 18

## 2023-07-25 MED ORDER — SODIUM CHLORIDE 0.9 % IV SOLN WRAPPED *I*
30.0000 mL/h | Status: DC | PRN
Start: 2023-07-25 — End: 2023-07-25
  Administered 2023-07-25: 30 mL/h via INTRAVENOUS

## 2023-07-25 MED ORDER — FAMOTIDINE (PF) 20 MG/2ML IV SOLN *I*
INTRAVENOUS | Status: AC
Start: 2023-07-25 — End: 2023-07-25
  Filled 2023-07-25: qty 2

## 2023-07-25 MED ORDER — DIPHENHYDRAMINE HCL 50 MG/ML IJ SOLN *I*
INTRAMUSCULAR | Status: AC
Start: 2023-07-25 — End: 2023-07-25
  Filled 2023-07-25: qty 1

## 2023-07-25 MED ORDER — DEXAMETHASONE SOD PHOSPHATE PF 10 MG/ML IJ SOLN *I*
20.0000 mg | Freq: Once | INTRAMUSCULAR | Status: AC
Start: 2023-07-25 — End: 2023-07-25
  Administered 2023-07-25: 20 mg via INTRAVENOUS

## 2023-07-25 NOTE — Progress Notes (Signed)
 D1C2 Carboplatin  (Taxol  held this cycle). Consent 06/28/23, handoff reviewed, labs within parameters to treat. IVAD accessed with positive blood return pre and post infusion. Patient tolerated infusion well without a suspected reaction. VSS. IVAD flushed with normal saline and deaccessed. Port draw appointment made for 7/16. Pt and spouse aware and amenable. AVS given at discharge.    Management notified to discuss next treatment appointment/schedule with [place] if taxol  will be added back in to the next cycle.

## 2023-07-28 LAB — INHIBIN B: Inhibin B: 56 pg/mL — ABNORMAL HIGH (ref <=11)

## 2023-08-11 ENCOUNTER — Other Ambulatory Visit: Payer: Self-pay | Admitting: Obstetrics and Gynecology

## 2023-08-13 ENCOUNTER — Other Ambulatory Visit: Payer: Self-pay | Admitting: Obstetrics and Gynecology

## 2023-08-13 MED ORDER — LORATADINE 10 MG PO TABS *I*: 10.0000 mg | ORAL_TABLET | Freq: Every day | ORAL | 1 refills | Status: DC

## 2023-08-14 ENCOUNTER — Encounter: Payer: Self-pay | Admitting: Obstetrics & Gynecology

## 2023-08-15 ENCOUNTER — Ambulatory Visit

## 2023-08-15 NOTE — Progress Notes (Signed)
 CC: chemotherapy    HPI:  Julie Walsh 73 y.o. female presents today for pre chemo visit. On 06/05/2023 she underwent:    1. Radical resection recurrent ovarian malignancy: exploratory laparotomy, removal of small bowel, peritoneal implants, removal of portion of omentum, small bowel resection with reanastomosis, lysis of adhesions with oversewing serotomy, optimal microscopic cytoreduction at the end of the case     FINAL PATHOLOGY:   A. Small bowel, implant, biopsy:   -Granulosa cell tumor.    B. Transverse colon, implant, biopsy:   -Granulosa cell tumor.    C. Pelvic, small bowel implant, biopsy:   -Benign adipose tissue with hemorrhage.    D. Small bowel implants #2, biopsy:   -Granulosa cell tumor.    E. Omentum, partial omentectomy:   -Adipose tissue involved by scattered small foci of granulosa cell tumor.    F. Colonic implant, biopsy:   -Fibroadipose tissue involved by scattered small foci of atypical  cells, suspicious for granulosa cell tumor.    G. Soft tissue, right upper quadrant mass, resection:   -Two benign lymph nodes (0/2).   -Benign adipose tissue.    H. Small bowel, mass, resection:   -Granulosa cell tumor, with extensive intratumoral hemorrhage and cystic formation, predominantly involving small intestinal serosa/subserosa and muscularis propria.   -Background small intestinal mucosa with mild intraepithelial lymphocytosis.   -Resection margins negative for tumor.   -See comment.    Julie Walsh received C1 Taxol /Carbo on 07/03/2023, however experienced syncopal episode. MERT response called and patient transported to ED. While in ED she was diagnosed with pneumonia. Of note, patient also admitted to not eating prior to chemotherapy appointment.     Her Taxol  was dropped with cycle #2 due to neuropathy. She had bone pain with cycle #1. She reports her bone pain did occur again with cycle #3 but this resolves with claritin . She reports her neuropathy has significantly improved since last cycle. She no  longer has neuropathy in her feet and it is only intermittent in her fingertips. She is taking L-glutamine and Vitamin B6. She is tolerating PO intake without any issues and is experiencing normal bowel and bladder habits. She denies abdominal pain, vaginal bleeding and fatigue. Overall she is feeling well.      Latest Reference Range & Units 05/03/23 14:11 06/30/23 10:01 07/23/23 09:26   Inhibin B <=11 pg/mL 76 (H) 74 (H) 56 (H)   (H): Data is abnormally high          Lab results: 07/23/23  0926 06/30/23  1001 05/03/23  1411 01/11/23  1507 09/22/22  1335   CA 125(eff 04-2008) 23 37* 16 16 14       PE:  BP 120/80 (BP Location: Left arm, Patient Position: Sitting, Cuff Size: adult)   Pulse 98   Temp 36.2 C (97.2 F) (Temporal)   Resp 18   Ht 152.4 cm (5')   Wt 67.2 kg (148 lb 3.2 oz)   SpO2 98%   BMI 28.94 kg/m     Constitutional: She is oriented to person, place, and time. She appears well-developed and well-nourished.   HENT: Sclera are normal. Normocephalic  Cardiac: S1 and S2 heart sounds auscultated, no S3 or S4 heart sounds auscultated, no JVD, no peripheral edema  Respiratory: Clear to auscultation in all lobes, no wheezes or rhonchi auscultated, no dullness percussed  Abdominal: Soft. She exhibits no distension and no mass. No tenderness.   Lymphadenopathy: She has no cervical adenopathy present.   Skin: Warm  and dry. No rashes noted  MSK: No edema  Psych: Normal mood and affect.   Vital signs reviewed      Impression:    - Final pathology: recurrent Granulosa cell tumor  - Tumor Board Recommendations: Carbo/Taxol     Plan:  1. Granulosa cell tumor:   - C3 Carbo/Taxol  pending confirmation    2. Neuropathy   - B6 and L-glutamine   - has improved, tentatively plan to have Taxol  at 25% dose reduction. Will confirm this with Dr. [name] and make patient aware    3. Bone Pain   - Discontinue oxycodone  use   - Start Claritin  daily for pain   - Continue Tylenol      Maleigha Colvard C Myeesha Shane, NP

## 2023-08-16 ENCOUNTER — Other Ambulatory Visit: Payer: Self-pay

## 2023-08-17 ENCOUNTER — Ambulatory Visit

## 2023-08-17 ENCOUNTER — Ambulatory Visit: Attending: Oncology | Admitting: Oncology

## 2023-08-17 ENCOUNTER — Encounter: Payer: Self-pay | Admitting: Obstetrics & Gynecology

## 2023-08-17 ENCOUNTER — Other Ambulatory Visit: Payer: Self-pay

## 2023-08-17 VITALS — BP 120/80 | HR 98 | Temp 97.2°F | Resp 18 | Ht 60.0 in | Wt 148.2 lb

## 2023-08-17 DIAGNOSIS — M898X9 Other specified disorders of bone, unspecified site: Secondary | ICD-10-CM | POA: Insufficient documentation

## 2023-08-17 DIAGNOSIS — Z5111 Encounter for antineoplastic chemotherapy: Secondary | ICD-10-CM | POA: Insufficient documentation

## 2023-08-17 DIAGNOSIS — G629 Polyneuropathy, unspecified: Secondary | ICD-10-CM | POA: Insufficient documentation

## 2023-08-17 DIAGNOSIS — D3910 Neoplasm of uncertain behavior of unspecified ovary: Secondary | ICD-10-CM

## 2023-08-17 LAB — CBC AND DIFFERENTIAL
Baso # K/uL: 0 THOU/uL (ref 0.0–0.2)
Eos # K/uL: 0.2 THOU/uL (ref 0.0–0.5)
Hematocrit: 37 % (ref 34–49)
Hemoglobin: 11.7 g/dL (ref 11.2–16.0)
IMM Granulocytes #: 0 THOU/uL
IMM Granulocytes: 0.2 %
Lymph # K/uL: 1.6 THOU/uL (ref 1.0–5.0)
MCV: 96 fL (ref 75–100)
Mono # K/uL: 0.5 THOU/uL (ref 0.1–1.0)
Neut # K/uL: 2.7 THOU/uL (ref 1.5–6.5)
Nucl RBC # K/uL: 0 THOU/uL
Nucl RBC %: 0 /100{WBCs} (ref 0.0–0.2)
Platelets: 184 THOU/uL (ref 150–450)
RBC: 3.8 MIL/uL — ABNORMAL LOW (ref 4.0–5.5)
RDW: 15.7 % — ABNORMAL HIGH (ref 0.0–15.0)
Seg Neut %: 53.3 %
WBC: 5 THOU/uL (ref 3.5–11.0)

## 2023-08-17 LAB — COMPREHENSIVE METABOLIC PANEL
ALT: 21 U/L (ref 0–35)
AST: 24 U/L (ref 0–35)
Albumin: 3.9 g/dL (ref 3.5–5.2)
Alk Phos: 87 U/L (ref 35–105)
Anion Gap: 13 (ref 7–16)
Bilirubin,Total: 0.3 mg/dL (ref 0.0–1.2)
CO2: 23 mmol/L (ref 20–28)
Calcium: 9.4 mg/dL (ref 8.6–10.2)
Chloride: 104 mmol/L (ref 96–108)
Creatinine: 1.11 mg/dL — ABNORMAL HIGH (ref 0.51–0.95)
Glucose: 118 mg/dL — ABNORMAL HIGH (ref 60–99)
Lab: 13 mg/dL (ref 6–20)
Potassium: 4 mmol/L (ref 3.3–5.1)
Sodium: 140 mmol/L (ref 133–145)
Total Protein: 6.5 g/dL (ref 6.3–7.7)
eGFR BY CREAT: 52 — AB

## 2023-08-17 LAB — APTT: aPTT: 34.1 s (ref 25.8–37.9)

## 2023-08-17 LAB — CA 125: CA 125(eff 4-2010): 21 U/mL (ref 0–34)

## 2023-08-17 NOTE — Progress Notes (Unsigned)
 [facility] TREATMENT HAND-OFF TOOL:   SITUATION:   Scheduled treatment category for today:     Is this a new cancer treatment?: No    Patient seen by APP    Consent obtained:  Yes    Location:  [facility]    Labs complete:  Yes    Within parameters: Yes      Date of lab work: 08/17/2023    Current patient status:  Scheduled treatment with change    Scheduled treatment change(s):  Dose reduction    Comments on change:  Regimen (Consent/Date [06/28/2023]): Taxol Dennice Q 3 weeks   Cosigned Ht/Wt: 06/28/2023  Blood Transfusion consent: 06/28/2023  Today's Cycle: C3 Taxol Dennice 08/20/2023  IV Access: Mediport- placed on 07/23/23 OK for use     Active issues: Recurrent Granulosa Cell Tumor. Tumor Board recommendation for Taxol /Carbo. Taxol  stopped C2 d/t neuropathy. Plan to add Taxol  back with C3- awaiting for dose confirmation. Will update.     Appts needed: Schedule C4 Carbo/Taxol  in 3 weeks. Please schedule port draw 2-3 days prior to infusion.    Today's confirmed treatment plan: C3 Taxol Dennice 08/20/2023

## 2023-08-17 NOTE — Progress Notes (Signed)
 Patient arrived for port draw. IVAD accessed, flushes well with brisk blood return. Standing labs drawn and collected. IVAD flushed with saline and de-accessed. Returning on Monday, patient aware- AVS declined, uses mychart.

## 2023-08-19 ENCOUNTER — Other Ambulatory Visit: Payer: Self-pay

## 2023-08-20 ENCOUNTER — Telehealth: Payer: Self-pay

## 2023-08-20 ENCOUNTER — Other Ambulatory Visit: Payer: Self-pay

## 2023-08-20 ENCOUNTER — Ambulatory Visit: Attending: Oncology

## 2023-08-20 ENCOUNTER — Other Ambulatory Visit: Payer: Self-pay | Admitting: Oncology

## 2023-08-20 VITALS — BP 145/74 | HR 84 | Temp 97.0°F | Resp 16 | Wt 147.9 lb

## 2023-08-20 DIAGNOSIS — D3910 Neoplasm of uncertain behavior of unspecified ovary: Secondary | ICD-10-CM | POA: Insufficient documentation

## 2023-08-20 DIAGNOSIS — Z5111 Encounter for antineoplastic chemotherapy: Secondary | ICD-10-CM | POA: Insufficient documentation

## 2023-08-20 LAB — INHIBIN B: Inhibin B: 59 pg/mL — ABNORMAL HIGH (ref <=11)

## 2023-08-20 MED ORDER — FAMOTIDINE (PF) 20 MG/2ML IV SOLN *I*
20.0000 mg | Freq: Once | INTRAVENOUS | Status: AC
Start: 2023-08-20 — End: 2023-08-20
  Administered 2023-08-20: 20 mg via INTRAVENOUS

## 2023-08-20 MED ORDER — FAMOTIDINE (PF) 20 MG/2ML IV SOLN *I*
INTRAVENOUS | Status: AC
Start: 2023-08-20 — End: 2023-08-20
  Filled 2023-08-20: qty 2

## 2023-08-20 MED ORDER — PALONOSETRON HCL 0.25 MG/5ML IV SOLN *I*
INTRAVENOUS | Status: AC
Start: 2023-08-20 — End: 2023-08-20
  Filled 2023-08-20: qty 5

## 2023-08-20 MED ORDER — DEXTROSE 5 % IV SOLN WRAPPED *I*
359.0000 mg | Freq: Once | INTRAVENOUS | Status: AC
Start: 2023-08-20 — End: 2023-08-20
  Administered 2023-08-20: 359 mg via INTRAVENOUS
  Filled 2023-08-20: qty 35.9

## 2023-08-20 MED ORDER — SODIUM CHLORIDE 0.9 % IV SOLN WRAPPED *I*
135.0000 mg/m2 | Freq: Once | Status: AC
Start: 2023-08-20 — End: 2023-08-20
  Administered 2023-08-20: 240 mg via INTRAVENOUS
  Filled 2023-08-20: qty 40

## 2023-08-20 MED ORDER — DIPHENHYDRAMINE HCL 50 MG/ML IJ SOLN *I*
50.0000 mg | Freq: Once | INTRAMUSCULAR | Status: AC
Start: 2023-08-20 — End: 2023-08-20
  Administered 2023-08-20: 50 mg via INTRAVENOUS

## 2023-08-20 MED ORDER — DEXAMETHASONE SOD PHOSPHATE PF 10 MG/ML IJ SOLN *I*
20.0000 mg | Freq: Once | INTRAMUSCULAR | Status: AC
Start: 2023-08-20 — End: 2023-08-20
  Administered 2023-08-20: 20 mg via INTRAVENOUS

## 2023-08-20 MED ORDER — DEXAMETHASONE SOD PHOSPHATE PF 10 MG/ML IJ SOLN *I*
INTRAMUSCULAR | Status: AC
Start: 2023-08-20 — End: 2023-08-20
  Filled 2023-08-20: qty 2

## 2023-08-20 MED ORDER — SEQUENCING OF DRUGS FOR ADMINISTRATION *I*
Status: DC | PRN
Start: 2023-08-20 — End: 2023-08-20
  Filled 2023-08-20: qty 1

## 2023-08-20 MED ORDER — APREPITANT 130 MG/18ML IV EMUL *I*
130.0000 mg | Freq: Once | INTRAVENOUS | Status: AC
Start: 2023-08-20 — End: 2023-08-20
  Administered 2023-08-20: 130 mg via INTRAVENOUS

## 2023-08-20 MED ORDER — DIPHENHYDRAMINE HCL 50 MG/ML IJ SOLN *I*
INTRAMUSCULAR | Status: AC
Start: 2023-08-20 — End: 2023-08-20
  Filled 2023-08-20: qty 1

## 2023-08-20 MED ORDER — APREPITANT 130 MG/18ML IV EMUL *I*
INTRAVENOUS | Status: AC
Start: 2023-08-20 — End: 2023-08-20
  Filled 2023-08-20: qty 18

## 2023-08-20 MED ORDER — PALONOSETRON HCL 0.25 MG/5ML IV SOLN *I*
0.2500 mg | Freq: Once | INTRAVENOUS | Status: AC
Start: 2023-08-20 — End: 2023-08-20
  Administered 2023-08-20: 0.25 mg via INTRAVENOUS

## 2023-08-20 MED ORDER — SODIUM CHLORIDE 0.9 % IV SOLN WRAPPED *I*
30.0000 mL/h | Status: DC | PRN
Start: 2023-08-20 — End: 2023-08-20
  Administered 2023-08-20: 30 mL/h via INTRAVENOUS

## 2023-08-20 NOTE — Progress Notes (Signed)
 D1C3 Taxol /Carboplatin  (#10) with pre-medications via IVAD with positive blood return pre and post infusion. Husband Quintin chairside for part of the visit. Consent up to date (06/28/23), labs within parameters (from 7/18), handoff checked. Patient tolerated infusions well with no suspected reaction, VSS. IVAD flushed with normal saline and de-accessed. NO heparin  flush because the patient is returning within two months. Appointments made for 8/8 [facility] Draw) and 8/11 (Chemotherapy) - printout AVS given.

## 2023-08-20 NOTE — Progress Notes (Signed)
 Confirmed with Dr. 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 Message sent to nursing to notify patient. Orders signed    Julie Walsh C Wessley Emert, NP

## 2023-08-20 NOTE — Telephone Encounter (Signed)
 Called [name] to let her know that Tinnie, NP confirmed with Dr. [name] that she will be  proceeding with Taxol  at dose reduction like discussed at her appointment. [name] was appreciative of call.
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
## 2023-09-03 ENCOUNTER — Other Ambulatory Visit: Payer: Self-pay

## 2023-09-03 ENCOUNTER — Ambulatory Visit

## 2023-09-03 ENCOUNTER — Encounter: Payer: Self-pay | Admitting: Oncology

## 2023-09-04 ENCOUNTER — Encounter: Payer: Self-pay | Admitting: Oncology

## 2023-09-04 ENCOUNTER — Other Ambulatory Visit: Payer: Self-pay

## 2023-09-04 DIAGNOSIS — D3910 Neoplasm of uncertain behavior of unspecified ovary: Secondary | ICD-10-CM

## 2023-09-04 NOTE — Progress Notes (Signed)
 PDL-1 testing Charolette Lefevre, RN

## 2023-09-05 ENCOUNTER — Ambulatory Visit

## 2023-09-05 ENCOUNTER — Other Ambulatory Visit: Payer: Self-pay

## 2023-09-05 ENCOUNTER — Other Ambulatory Visit
Admission: RE | Admit: 2023-09-05 | Discharge: 2023-09-05 | Disposition: A | Discharge status: Home / Self Care | Source: Ambulatory Visit

## 2023-09-06 ENCOUNTER — Encounter: Payer: Self-pay | Admitting: Obstetrics & Gynecology

## 2023-09-06 ENCOUNTER — Other Ambulatory Visit: Payer: Self-pay

## 2023-09-06 ENCOUNTER — Telehealth: Payer: Self-pay

## 2023-09-06 ENCOUNTER — Encounter: Payer: Self-pay | Admitting: Oncology

## 2023-09-06 ENCOUNTER — Ambulatory Visit: Attending: Obstetrics & Gynecology | Admitting: Obstetrics & Gynecology

## 2023-09-06 VITALS — BP 140/80 | HR 85 | Temp 97.2°F | Resp 20 | Ht 60.0 in | Wt 146.0 lb

## 2023-09-06 DIAGNOSIS — Z5111 Encounter for antineoplastic chemotherapy: Secondary | ICD-10-CM | POA: Insufficient documentation

## 2023-09-06 DIAGNOSIS — D3910 Neoplasm of uncertain behavior of unspecified ovary: Secondary | ICD-10-CM | POA: Insufficient documentation

## 2023-09-06 NOTE — Telephone Encounter (Signed)
 [place], [place]  A  [facility] Nurse TriagePlease call patient with positive screen.  Appt 8/7 @ 3:45pm.Thanks!Ginger

## 2023-09-06 NOTE — Addendum Note (Signed)
 Addended by: CRISTELA GAUZE on: 09/06/2023 04:29 PM Modules accepted: Orders

## 2023-09-06 NOTE — Telephone Encounter (Signed)
 Call made to patientTravel screening: Do you have any of the following new or worsening symptoms? Fatigue  Patient states that she is currently being treated for a bladder infection and is experiencing fatigue from thatDenies fever, chills, URI symptomsPatient to see Dr. [name] in office todayAmber Cristela, RN

## 2023-09-07 ENCOUNTER — Telehealth: Payer: Self-pay

## 2023-09-07 ENCOUNTER — Ambulatory Visit: Attending: Obstetrics & Gynecology

## 2023-09-07 ENCOUNTER — Other Ambulatory Visit: Payer: Self-pay | Admitting: Oncology

## 2023-09-07 ENCOUNTER — Ambulatory Visit: Admitting: Radiation Oncology

## 2023-09-07 ENCOUNTER — Ambulatory Visit: Attending: Radiation Oncology | Admitting: Radiation Oncology

## 2023-09-07 ENCOUNTER — Encounter: Payer: Self-pay | Admitting: Obstetrics & Gynecology

## 2023-09-07 ENCOUNTER — Other Ambulatory Visit: Payer: Self-pay

## 2023-09-07 VITALS — BP 130/79 | HR 97 | Temp 97.3°F | Wt 148.3 lb

## 2023-09-07 DIAGNOSIS — D3910 Neoplasm of uncertain behavior of unspecified ovary: Secondary | ICD-10-CM | POA: Insufficient documentation

## 2023-09-07 DIAGNOSIS — Z5181 Encounter for therapeutic drug level monitoring: Secondary | ICD-10-CM | POA: Insufficient documentation

## 2023-09-07 LAB — COMPREHENSIVE METABOLIC PANEL
ALT: 22 U/L (ref 0–35)
AST: 22 U/L (ref 0–35)
Albumin: 3.7 g/dL (ref 3.5–5.2)
Alk Phos: 79 U/L (ref 35–105)
Anion Gap: 14 (ref 7–16)
Bilirubin,Total: 0.3 mg/dL (ref 0.0–1.2)
CO2: 24 mmol/L (ref 20–28)
Calcium: 9.2 mg/dL (ref 8.6–10.2)
Chloride: 106 mmol/L (ref 96–108)
Creatinine: 1.03 mg/dL — ABNORMAL HIGH (ref 0.51–0.95)
Glucose: 134 mg/dL — ABNORMAL HIGH (ref 60–99)
Lab: 9 mg/dL (ref 6–20)
Potassium: 3.5 mmol/L (ref 3.3–5.1)
Sodium: 144 mmol/L (ref 133–145)
Total Protein: 6.2 g/dL — ABNORMAL LOW (ref 6.3–7.7)
eGFR BY CREAT: 57 — AB

## 2023-09-07 LAB — CBC AND DIFFERENTIAL
Baso # K/uL: 0 THOU/uL (ref 0.0–0.2)
Eos # K/uL: 0.1 THOU/uL (ref 0.0–0.5)
Hematocrit: 34 % (ref 34–49)
Hemoglobin: 10.9 g/dL — ABNORMAL LOW (ref 11.2–16.0)
IMM Granulocytes #: 0.1 THOU/uL — ABNORMAL HIGH
IMM Granulocytes: 1 %
Lymph # K/uL: 1.3 THOU/uL (ref 1.0–5.0)
MCV: 95 fL (ref 75–100)
Mono # K/uL: 0.6 THOU/uL (ref 0.1–1.0)
Neut # K/uL: 2.8 THOU/uL (ref 1.5–6.5)
Nucl RBC # K/uL: 0 THOU/uL
Nucl RBC %: 0 /100{WBCs} (ref 0.0–0.2)
Platelets: 187 THOU/uL (ref 150–450)
RBC: 3.6 MIL/uL — ABNORMAL LOW (ref 4.0–5.5)
RDW: 16 % — ABNORMAL HIGH (ref 0.0–15.0)
Seg Neut %: 57.3 %
WBC: 5 THOU/uL (ref 3.5–11.0)

## 2023-09-07 LAB — CA 125: CA 125(eff 4-2010): 20 U/mL (ref 0–34)

## 2023-09-07 LAB — SURGICAL PATHOLOGY

## 2023-09-07 LAB — APTT: aPTT: 30.5 s (ref 25.8–37.9)

## 2023-09-07 NOTE — Progress Notes (Signed)
 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 She had initial surgery in 2012 and recurrence in 2020. She underwent resection of a 12 cm tumor, with resection of cecum and ileum with primary reanastomosis in 07/2018 in [place] . She underwent adjuvant Carbo/Taxol , then began Letrozole  in 03/2019. PET/CT scan showed a 2.5 cm region in the left adnexa, measuring 2.5 cm in left adnexa. She underwent SBRT radiation to this solitary metastasis in 03/2022 (8 Gy x 5) CT scan on 06/08/22 shows reduction of size of the left adnexal cyst (from 2.5 cm to 1.3 cm). Inhibin B level decreased from 63 to 47. 05/07/23 - CT chest/abdomen/pelvis showed Significant interval enlargement of a mixed cystic solid lesion involving left lower quadrant small bowel loops. Other scattered mesenteric nodules are slightly larger. Enlarging mixed cystic solid mass involving left-sided small bowel loops series 2 image 164 now measuring up to 6.6 x 4.3 cm previously 18 mm.We discussed that although radiation worked well for her previous cystic mass, I did not recommend radiation for these new lesions, as she had a long bout of diarrhea after her radiation, and these lesions were even closer to small bowel. On 06/05/23, she underwent radical resection recurrent ovarian malignancy by Dr. [name] - with exploratory laparotomy, removal of small bowel, peritoneal implants, removal of portion of omentum, small bowel resection with reanastomosis, lysis of adhesions with oversewing serotomy, optimal microscopic cytoreduction at the end of the case (Dr. [name]) She then began chemotherapy on 07/03/23, while continuting on Letrozole . Interval History: Julie is doing pretty well. She had a syncopal episode while receiving her first chemotherapy treatment, and was subsequently diagnosed with pneumonia. She has been able to  successfully continue the chemotherapy. She reports significant diarrhea and fatigue after chemo for 1-2 weeks, then has a good week before starting chemo again. No abdominal cramping. She has no urinary symptoms.  On complete review of systems, she denies any areas of pain, poor appetite, nausea, vomiting, weight loss, fevers, sweats, headaches, cough, shortness of breath, or mood changes. Current Outpatient Medications Medication  nitrofurantoin mono/macro 100 mg capsule  FLUoxetine 10 mg capsule  multivitamin with minerals-trace elements (CENTRUM SILVER) TABS tablet  loratadine  (CLARITIN ) 10 mg tablet  ondansetron  (ZOFRAN ) 8 mg tablet  prochlorperazine  (COMPAZINE ) 10 mg tablet  senna (SENOKOT) 8.6 mg tablet  generic DME  atorvastatin  (LIPITOR) 40 mg tablet  clotrimazole-betamethasone (LOTRISONE) cream  ondansetron  (ZOFRAN ) 4 mg tablet  triamcinolone (KENALOG) 0.1 % cream  famotidine  20 mg tablet  tretinoin (RETIN-A) 0.05 % cream  GABAPENTIN  100 MG capsule  acetaminophen  650 mg CR tablet  apixaban  (ELIQUIS ) 5 mg tablet  cyclobenzaprine (FLEXERIL) 5 mg tablet  hydrocortisone (ANUSOL-HC) 2.5 % rectal cream  letrozole  (FEMARA ) 2.5 mg tablet  Multivitamin tablet  pantoprazole  (PROTONIX ) 40 mg EC tablet  ALPRAZolam (NIRAVAM) 0.25 MG dissolvable tablet  nystatin (MYCOSTATIN) 100000 UNIT/GM cream  gabapentin  (NEURONTIN ) 300 MG capsule  traZODone  (DESYREL ) 100 MG tablet  Cholecalciferol (VITAMIN D3) 1000 UNIT tablet No current facility-administered medications for this visit. Physical Exam: Well developed, well nourished woman in no acute distress.  Very pleasant and cooperative.  BP 130/79 (BP Location: Right arm, Patient Position: Sitting, Cuff Size: adult)   Pulse 97   Temp 36.3 C (97.3 F)   Wt 67.3 kg (148 lb 4.8 oz)   SpO2 95%   BMI 28.96 kg/m . KPS 70%. 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 She had a good local response, but developed additional metastases that were not amenable to radiation. She has had resection of the lesions and started chemotherapy, and continues letrozole . However, her inhibin-A levels have not decreased. She saw Dr. [name] recently and will be scheduled for restaging CT scans in the near future. We discussed the role of radiation therapy should she develop amenable lesions that are not responding to systemic therapy and/or causing symptoms. Plan: Julie Walsh will follow with Gyn Oncology at the end of the month.We will see her on an as-needed basis. SABRAFranky Kindred MD

## 2023-09-07 NOTE — Progress Notes (Signed)
 [facility] TREATMENT HAND-OFF TOOL: SITUATION: Scheduled treatment category for today:   Is this a new cancer treatment?: No  Patient seen by APP and MD  Consent obtained:  Yes  Location:  [facility]  Labs complete:  Yes  Within parameters: Yes    Date of lab work: 09/07/2023  Current patient status:  Scheduled treatment  Additional relevant information:  Other  Comments on information needed:  Regimen (Consent/Date [06/28/2023]): Taxol Dennice Q 3 weeks Cosigned Ht/Wt: 06/28/2023- will need new one for C5Blood Transfusion consent: 5/29/2025Today's Cycle: C4 Taxol Dennice 8/11/2025IV Access: MediportActive issues: Recurrent Granulosa Cell Tumor. Tumor Board recommendation for Taxol /Carbo. Slow decline in inhibin B and CT scan ordered. PDL-1 testing sent for treatment options. Will need inhibin B draw at infusion.Appts needed: Schedule C5 Carbo/Taxol  in 3 weeks. Please schedule port draw 2-3 days prior to infusion.  Today's confirmed treatment plan: C4 Taxol Dennice 09/10/2023

## 2023-09-07 NOTE — Progress Notes (Addendum)
 Patient presents with spouse Quintin, for follow-up with Dr. Lethaniel. She has hx of ovarian Ca.  Currently being tx for UTIPain: 0/10Energy Level: severe fatigueGU: currently on antibx for UTI,  denies urinary urgency, frequency or incontinence but has sx on chemo weekGI: no diarrhea or stool incontinence when off chemo, occasionally has BRBPR with hemorrhoidsGYN: denies vaginal bleeding or dischargeChemotherapy/Hormone Therapy/Immunotherapy: Carbo/reduced Taxol  q 3 wks on Mondays secondary to neuropathies, also on LetrazoleUpcoming Appointments: sees Dr. Ulanda Sharps who ordered CT C/A/P today.  Overall, pt feels severely fatigued and states that she is becoming more worrisome about her health and treatment.

## 2023-09-07 NOTE — Telephone Encounter (Signed)
 Call placed to patientSeen in office on 09/06/23 for chemo follow upDr. [name] has decided to proceed with CT C/A/POrders placed and patient made Marisa Lefevre, RN

## 2023-09-07 NOTE — Progress Notes (Signed)
 Patient presents for port draw. IVAD accessed and blood return noted. Blood samples collected and sent to lab. IVAD saline flushed and deaccessed, site benign. Tolerated well. Next appointment confirmed. AVS declined.

## 2023-09-09 ENCOUNTER — Other Ambulatory Visit: Payer: Self-pay

## 2023-09-10 ENCOUNTER — Other Ambulatory Visit: Payer: Self-pay

## 2023-09-10 ENCOUNTER — Telehealth: Payer: Self-pay

## 2023-09-10 ENCOUNTER — Ambulatory Visit: Attending: Obstetrics & Gynecology

## 2023-09-10 ENCOUNTER — Ambulatory Visit
Admission: RE | Admit: 2023-09-10 | Discharge: 2023-09-10 | Disposition: A | Discharge status: Home / Self Care | Source: Ambulatory Visit | Admitting: Radiology

## 2023-09-10 VITALS — BP 141/76 | HR 76 | Temp 97.0°F | Resp 17 | Wt 147.9 lb

## 2023-09-10 DIAGNOSIS — N1330 Unspecified hydronephrosis: Secondary | ICD-10-CM

## 2023-09-10 DIAGNOSIS — Z8543 Personal history of malignant neoplasm of ovary: Secondary | ICD-10-CM | POA: Insufficient documentation

## 2023-09-10 DIAGNOSIS — K7689 Other specified diseases of liver: Secondary | ICD-10-CM | POA: Insufficient documentation

## 2023-09-10 DIAGNOSIS — D3910 Neoplasm of uncertain behavior of unspecified ovary: Secondary | ICD-10-CM | POA: Insufficient documentation

## 2023-09-10 MED ORDER — DEXAMETHASONE SOD PHOSPHATE PF 10 MG/ML IJ SOLN *I*
INTRAMUSCULAR | Status: AC
Start: 2023-09-10 — End: 2023-09-10
  Filled 2023-09-10: qty 2

## 2023-09-10 MED ORDER — APREPITANT 130 MG/18ML IV EMUL *I*
130.0000 mg | Freq: Once | INTRAVENOUS | Status: AC
Start: 2023-09-10 — End: 2023-09-10
  Administered 2023-09-10: 130 mg via INTRAVENOUS

## 2023-09-10 MED ORDER — DEXAMETHASONE SOD PHOSPHATE PF 10 MG/ML IJ SOLN *I*
20.0000 mg | Freq: Once | INTRAMUSCULAR | Status: AC
Start: 2023-09-10 — End: 2023-09-10
  Administered 2023-09-10: 20 mg via INTRAVENOUS

## 2023-09-10 MED ORDER — FAMOTIDINE (PF) 20 MG/2ML IV SOLN *I*
20.0000 mg | Freq: Once | INTRAVENOUS | Status: AC
Start: 2023-09-10 — End: 2023-09-10
  Administered 2023-09-10: 20 mg via INTRAVENOUS

## 2023-09-10 MED ORDER — STERILE WATER FOR IRRIGATION IR SOLN *I*
900.0000 mL | Freq: Once | Status: AC
Start: 2023-09-10 — End: 2023-09-10
  Administered 2023-09-10: 900 mL via ORAL

## 2023-09-10 MED ORDER — SODIUM CHLORIDE 0.9 % INJ (FLUSH) WRAPPED *I*
20.0000 mL | Freq: Once | Status: AC
Start: 2023-09-10 — End: 2023-09-10
  Administered 2023-09-10: 20 mL via INTRAVENOUS

## 2023-09-10 MED ORDER — DIPHENHYDRAMINE HCL 50 MG/ML IJ SOLN *I*
INTRAMUSCULAR | Status: AC
Start: 2023-09-10 — End: 2023-09-10
  Filled 2023-09-10: qty 1

## 2023-09-10 MED ORDER — SODIUM CHLORIDE 0.9 % IV SOLN WRAPPED *I*
135.0000 mg/m2 | Freq: Once | Status: AC
Start: 2023-09-10 — End: 2023-09-10
  Administered 2023-09-10: 240 mg via INTRAVENOUS
  Filled 2023-09-10: qty 40

## 2023-09-10 MED ORDER — IOHEXOL 350 MG/ML (OMNIPAQUE) IV SOLN *I*
1.0000 mL | Freq: Once | INTRAVENOUS | Status: AC
Start: 2023-09-10 — End: 2023-09-10
  Administered 2023-09-10: 98 mL via INTRAVENOUS

## 2023-09-10 MED ORDER — APREPITANT 130 MG/18ML IV EMUL *I*
INTRAVENOUS | Status: AC
Start: 2023-09-10 — End: 2023-09-10
  Filled 2023-09-10: qty 18

## 2023-09-10 MED ORDER — DIPHENHYDRAMINE HCL 50 MG/ML IJ SOLN *I*
50.0000 mg | Freq: Once | INTRAMUSCULAR | Status: AC
Start: 2023-09-10 — End: 2023-09-10
  Administered 2023-09-10: 50 mg via INTRAVENOUS

## 2023-09-10 MED ORDER — PALONOSETRON HCL 0.25 MG/5ML IV SOLN *I*
0.2500 mg | Freq: Once | INTRAVENOUS | Status: AC
Start: 2023-09-10 — End: 2023-09-10
  Administered 2023-09-10: 0.25 mg via INTRAVENOUS

## 2023-09-10 MED ORDER — FAMOTIDINE (PF) 20 MG/2ML IV SOLN *I*
INTRAVENOUS | Status: AC
Start: 2023-09-10 — End: 2023-09-10
  Filled 2023-09-10: qty 2

## 2023-09-10 MED ORDER — DEXTROSE 5 % IV SOLN WRAPPED *I*
377.5000 mg | Freq: Once | INTRAVENOUS | Status: AC
Start: 2023-09-10 — End: 2023-09-10
  Administered 2023-09-10: 377.5 mg via INTRAVENOUS
  Filled 2023-09-10: qty 37.75

## 2023-09-10 MED ORDER — PALONOSETRON HCL 0.25 MG/5ML IV SOLN *I*
INTRAVENOUS | Status: AC
Start: 2023-09-10 — End: 2023-09-10
  Filled 2023-09-10: qty 5

## 2023-09-10 NOTE — Telephone Encounter (Signed)
 Notification:  spoke with patient  Image type & Location:  CT @ ERRDate & Time:  8/11/125 @ 4:45 pm Prep:  npo 2 Follow up Date & Provider:    09/27/23 FOYE

## 2023-09-10 NOTE — Progress Notes (Signed)
 D1C4 Taxol /Carboplatin  (LT10). Consent up to date, labs within parameters, handoff checked. IVAD accessed with positive blood return pre and post infusion. Pt tolerated infusion(s) well with no suspected reactions. VSS. No concerns at this time. Made next appt in 3 weeks with PD prior, AVS declined, uses mychart.

## 2023-09-14 ENCOUNTER — Ambulatory Visit: Payer: Self-pay | Admitting: Obstetrics & Gynecology

## 2023-09-14 LAB — INHIBIN B: Inhibin B: 51 pg/mL — ABNORMAL HIGH (ref <=11)

## 2023-09-18 LAB — SURGICAL PATHOLOGY
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
## 2023-09-21 NOTE — Progress Notes (Cosign Needed)
 Multidisciplinary Gynecologic Oncology Tumor Board note08/22/25[name]12/06/[name] case was presented to the multidisciplinary gynecologic oncology tumor board meeting on 09/21/23. The patient's clinical and operative notes, pertinent radiologic and laboratory findings were reviewed. We reviewed 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 She was noted to have recurrence with pelvic mass in 2024 and received external beam radiation therapy. She underwent secondary debulking surgery in May 2025 and received adjvant carbo/taxol  (C3 - hold taxol  due to neuropathy).Following a comprehensive review and discussion, the tumor board's consensus recommendations are as follows:Treatment recommendations:  Plan for discontinuation of cytotoxic therapies and transition to broadened hormone blocking regimen: consideration to Megace added to Letrozole  vs Letrozole  alone vs Lupron. Recommendations for referral to Dr. Shlomo for recommendations regarding alternative therapies (suggested prior studies where Ribociclib was used). Genetic testing:  BRCA2 negativeTumor testing:  PDL1 negativeThis Tumor Board letter has been reviewed and approved ab:Mpryjmi G. Georgina, MD, FACOG, FACSChair of the Gynecologic Oncology Tumor Daun Shields, MD, FACOGCo-Chair of the Gynecologic Oncology Tumor BoardThis note summarizes the discussion and consensus recommendations of the Multidisciplinary Gynecologic Oncology Tumor Board at the [facility]. Cases are reviewed using NCCN Guidelines and current relevant literature as appropriate. These recommendations should be considered in conjunction with the management  discussions between the primary treating physician and patient.

## 2023-09-24 ENCOUNTER — Telehealth: Payer: Self-pay | Admitting: Obstetrics & Gynecology

## 2023-09-24 NOTE — Telephone Encounter (Signed)
 Called Julie Walsh to discuss the conversation from our [facility] meeting this past Friday. Discussed her PDL-1 testing is negative. Reviewed that based on her CT scan and her Inhibin B levels, we do not appear to be seeing a significant response with our chemotherapy and unfortunately she is having side effects. At this time, we have discussed discontinuation of her current chemotherapy. We will then plan for a referral to Dr. Hubert Bihari for treatment considerations with her. Julie Walsh is agreeable with this plan. All questions were answered. Julie Walsh, DO8/25/20256:22 PM

## 2023-09-25 NOTE — Telephone Encounter (Signed)
 Called and spoke with [name], she is all set and scheduled on 8/28 at 10 am with Dr. Shlomo, went over location.

## 2023-09-27 ENCOUNTER — Ambulatory Visit: Admitting: Obstetrics and Gynecology

## 2023-09-27 ENCOUNTER — Other Ambulatory Visit: Payer: Self-pay

## 2023-09-27 ENCOUNTER — Ambulatory Visit: Attending: Hematology | Admitting: Hematology

## 2023-09-27 VITALS — BP 140/82 | HR 87 | Temp 97.5°F | Resp 18 | Ht 60.0 in | Wt 147.2 lb

## 2023-09-27 DIAGNOSIS — D3911 Neoplasm of uncertain behavior of right ovary: Secondary | ICD-10-CM | POA: Insufficient documentation

## 2023-09-27 MED ORDER — PALBOCICLIB 125 MG PO TABS *A*
125.0000 mg | ORAL_TABLET | Freq: Every day | ORAL | 5 refills | Status: DC
  Filled 2023-09-27: qty 21, 28d supply, fill #0
  Filled 2023-11-15: qty 21, 28d supply, fill #1

## 2023-09-27 MED ORDER — LETROZOLE 2.5 MG PO TABS *I*: 2.5000 mg | ORAL_TABLET | Freq: Every day | ORAL | 5 refills | Status: DC

## 2023-09-27 NOTE — Progress Notes (Signed)
 Spoke on the phone with Julie Walsh regarding prescription for Palbociclib  (Ibrance )Indication: ovarianInitial assessment included review of interdisciplinary team documentation on physical health, social, environmental, economic, functional and mental components relevant to the patient's ability to adhere to the care plan with [facility] of [employer].Administration, treatment goals, duration of therapy, adherence strategies, side effects, safety, precautions, contraindications, missed dose management, handling, storage, and disposal were reviewed with the patient. Administration instructions: 125 mg by mouth once daily days 1-21 every 28 daysPlanned Start Date: 09/03/25Adherence plan: Combine with daily task; [facility] CalendarLab schedule: per treatment planSpecialty Pharmacy: [facility], Delivery Date: 9/2; Copay: $0Medication list reviewed; list is up to date. No significant interactions identified.Current Outpatient Medications Medication Sig  letrozole  (FEMARA ) 2.5 mg tablet Take 1 tablet (2.5 mg total) by mouth daily.  palbociclib  (IBRANCE ) 125 MG tablet Take 1 tablet (125 mg total) by mouth daily. Swallow whole. Take with or without food on days 1-21 of a 28 day cycle.  nitrofurantoin mono/macro 100 mg capsule Take 1 capsule (100 mg total) by mouth 2 times daily.  FLUoxetine 10 mg capsule Take 1 capsule (10 mg total) by mouth daily.  multivitamin with minerals-trace elements (CENTRUM SILVER) TABS tablet Take 1 tablet by mouth daily.  loratadine  (CLARITIN ) 10 mg tablet Take 1 tablet (10 mg total) by mouth daily for Bone pain related to chemotherapy.  senna (SENOKOT) 8.6 mg tablet Take 2 tablets by mouth nightly.  generic DME Dispense: Shower ChairIndication: Unsteady Gait ICD-10: R26.81Duration: Lifetime Ht Readings from Last 1 Encounters:11/29/22 : 1.499 m (4' 11) Wt Readings from Last 1 Encounters:11/29/22 : 66.7 kg (147  lb)  atorvastatin  (LIPITOR) 40 mg tablet Take 1 tablet (40 mg total) by mouth daily.  clotrimazole-betamethasone (LOTRISONE) cream Apply topically 2 times daily. For back  ondansetron  (ZOFRAN ) 4 mg tablet Take 1 tablet (4 mg total) by mouth every 8 hours as needed.  triamcinolone (KENALOG) 0.1 % cream Apply topically daily.  famotidine  20 mg tablet Take 1 tablet (20 mg total) by mouth nightly.  tretinoin (RETIN-A) 0.05 % cream SMARTSIG:Topical Every Night  GABAPENTIN  100 MG capsule Take 2 capsules (200 mg total) by mouth daily. Takes 200 mg in the AM and 300 mg in the PM  acetaminophen  650 mg CR tablet Take by mouth  apixaban  (ELIQUIS ) 5 mg tablet Take 1 tablet (5 mg total) by mouth every 12 hours.  cyclobenzaprine (FLEXERIL) 5 mg tablet Take by mouth  hydrocortisone (ANUSOL-HC) 2.5 % rectal cream   letrozole  (FEMARA ) 2.5 mg tablet Take by mouth  Multivitamin tablet Take 1 tablet by mouth daily.  pantoprazole  (PROTONIX ) 40 mg EC tablet   ALPRAZolam (NIRAVAM) 0.25 MG dissolvable tablet Take 1 tablet (0.25 mg total) by mouth nightly as needed for Anxiety.  nystatin (MYCOSTATIN) 100000 UNIT/GM cream Apply topically 2 times daily  to the following areas:  gabapentin  (NEURONTIN ) 300 MG capsule Take 1 capsule (300 mg total) by mouth nightly. Takes 200 mg in the AM and 300 mg in the PM  traZODone  (DESYREL ) 100 MG tablet Take 1 tablet (100 mg total) by mouth nightly.  Cholecalciferol (VITAMIN D3) 1000 UNIT tablet Take 1 tablet (1,000 units total) by mouth daily. Plan to follow up with patient about two weeks after start for toxicity check. Encouraged her to call with any questions or concerns.Thank you for allowing me to participate in the care of this patient. Please contact me with any questions.Lucie Fritz, PharmD, BCPSUniversity of Thoreau Specialty PharmacyPhone: [phone]: [phone]

## 2023-09-27 NOTE — Progress Notes (Signed)
 [facility] SOCIAL WORK:   Patient referred based on distress screen?: No    Patient referred due to G8?: No  Social Work contact made?: No    Social Work Assessment:  Chief Technology Officer Work post assessment plan:  As needed follow-up and ReferralReferral:   Pharmacy Assistance:  Pharmacy Voucher  Time spent on this encounter (in minutes):  10Covering Social Work [employer] completed to cover pt's Ibrance  co-pay.  She has not met her insurance deductible yet.  [facility] is [facility] pt an EPIC application.  SW available to assist as needed.[facility] SOCIAL WORK  PHARMACY FORM Today's date:  August 28, 2025Patient Name: Julie Walsh    Medical Record #: [id number] DOB: Jan 17, 1952Patient's Address: [address] [place]          Social Worker: [place] JULIANNA Gosling, LMSW     Date of Service: September 27, 2023   Funding Source: SW Medication Assistance Fund___________________________________________________________________Pharmacy Information:Date/time sent: September 27, 2023   Time needed: asapPatient Location: OutpatientMedication Pick-up Preference: Patient will pick up at the pharmacyPharmacy Contact: Ozell Gamer BerriosSocial work signature: Wyline JULIANNA Gosling, LMSWSupervisor/Manager Approval (if indicated):  Date:(supervisor signature not required for Medicaid pending)

## 2023-09-27 NOTE — Progress Notes (Signed)
 In to see patient today, she is here with her husband Levander and daughter. Palbociclib  / Letrozole  teaching completed. She will take Palbociclib  for 21 days and then have 1 week off. Letrozole  will be taken daily. Palbociclib  / Letrozole  teaching sheets given to patient to take home and to review. Reviewed all adverse side effects and ways to manage including hair thinning / loss, diarrhea, nausea / vomiting, rash, mouth sores. Reviewed lab requirements. She will get labs drawn prior to starting this regimen for baseline labs and  then every 2 weeks for the first 2 months, then monthly. Labs ordered. She will get labs drawn at [facility] infusion center via port draw. Consent signed today. Fall screen completed. Patient has anti emetics at home to take PRN. Palbociclib  released to specialty pharmacy who will obtain prior authorization and call patient with delivery status. Letrozole  sent to home pharmacy. Answered all patient questions and encouraged to call with additional questions.

## 2023-09-27 NOTE — Patient Instructions (Addendum)
 Palbociclib  (Ibrance )You have been diagnosed with:  Ovarian CancerYour medicine and plan of treatment is:  Ibrance  1 tablet (125 mg) days 1-21 of a 28 day cycleLab Frequency: Every 2 weeks for first two cycles, then prior to each cycle thereafter or as neededWhat does palbociclib  (Ibrance ) do?It is used to treat certain types of breast cancer. It may also be used for other cancers as determined by your doctor.Palbociclib  can be referred to as targeted therapy or oral chemotherapy and works by blocking the signals that tell cancer cells to grow.How should palbociclib  (Ibrance ) be taken?Palbociclib  capsules should be taken with food.Palbociclib  tablets may be taken with or without food.Take palbociclib  at the same time every day for 21 days followed by 7 days off, then repeat 21 days ON and 7 days OFF cycleSwallow tablets whole with a full glass of water . Do not crush, chew, or break. What should I do if I miss a dose?Skip missed dose and go back to your normal time. If you vomit a dose, do not take another dose. Take your next dose at your normal time.Never double-up or take two doses at the same time.Are there any interactions with other drugs that I need to worry about?Do not start any new prescriptions, over-the-counter medications, or herbal and dietary supplements without telling your doctor.Do not eat or drink grapefruit containing products Who should know I am taking palbociclib  (Ibrance )? Keep a list of all your medicines (prescription, natural products, supplements, vitamins, over-the-counter) and give it to your healthcare provider.What is the effect of palbociclib  (Ibrance ) on my fertility? Palbociclib  can cause fetal harm when administered to pregnant women and should not be used during pregnancyIt may affect female fertilityIt is recommended to use highly effective contraception while receiving palbociclib . Women should use contraception  during treatment and for up to 3 weeks after ending treatment. Men should use contraception during treatment and for 3 months after ending treatment.What side effects could occur with (Ibrance )? Important things to remember about side effects: Side effects are almost always temporary and will go away after treatment endsMost people do not have all of the side effects listed for a medicationIn many cases, we know when a side effect is likely to begin and how long it will lastWe can often prescribe medications to lessen the severity of side effects Common Side Effects:Feeling weak and tired (fatigue)Low white blood cells in blood (risk of infection)Low red blood cells in blood (shortness of breath, trouble breathing, feeling faint)Less Common Side Effects:Hair loss (alopecia)Nausea (feeling sick to your stomach) or throwing up (vomiting)Inflammation of mouth and lips (stomatitis)Loose, watery stool (diarrhea)Not feeling hungry (decreased appetite) Low platelets in blood (bleed and bruise easily)FeverRashRare, Serious Side Effects:Inflammation of the lungsWhat is most important to remember?Notify your doctor of any new shortness of breast, a cough that doesn't go away or chest pain. These may be signs of lung inflammationContact your doctor immediately if you develop:Itching or rash, or swelling of the face, lips, tongue or throat Signs of bleeding (abnormal bruising, dark or tarry stools, severe pain in the head or abdomen)Signs of an infection (fever, chills, night sweats, shortness of breath/cough)Do not start any new medications, over-the-counter drugs or herbal remedies without talking to your doctorTell all doctors, dentists and pharmacists that you are taking PalbociclibPatient Information Provided by: [facility] [employer] [address], [place] [address]Eynwz: [phone] 03/2023   September 2025  Sunday Monday Tuesday Wednesday Thursday Friday Saturday   1 2 3Start Ibrance  4Ibrance 5Ibrance 6Ibrance 7Ibrance 8Ibrance 9Ibrance 10Ibrance 11Ibrance  12Ibrance 13Ibrance 14Ibrance 15Ibrance 16Ibrance 17Ibrance 18IbranceCHEMO FOLLOW UP10:00 AM (30 min.) Shlomo Hubert NOVAK, MD, PhD [facility] 19Ibrance 20Ibrance 21Ibrance 22Ibrance 23Ibrance 24OFF --> 25 26 27 28 29 30  10/1Next cycle due
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
## 2023-09-28 ENCOUNTER — Telehealth: Payer: Self-pay

## 2023-09-28 ENCOUNTER — Other Ambulatory Visit: Payer: Self-pay

## 2023-09-28 ENCOUNTER — Ambulatory Visit

## 2023-09-28 NOTE — Telephone Encounter (Signed)
 Called patient and reviewed upcoming port draw appointment on 9/2 at 10AM at [facility] infusion center. Patient agreeable to appointment. Reviewed every 2 week port draws have been scheduled for the first 2 months while on Palbociclib . Patient will check mychart for each appointment detail. She is appreciative of call.

## 2023-10-01 ENCOUNTER — Other Ambulatory Visit: Payer: Self-pay

## 2023-10-02 ENCOUNTER — Ambulatory Visit

## 2023-10-02 ENCOUNTER — Ambulatory Visit: Attending: Hematology

## 2023-10-02 DIAGNOSIS — D3911 Neoplasm of uncertain behavior of right ovary: Secondary | ICD-10-CM | POA: Insufficient documentation

## 2023-10-02 LAB — CBC AND DIFFERENTIAL
Baso # K/uL: 0 THOU/uL (ref 0.0–0.2)
Eos # K/uL: 0.1 THOU/uL (ref 0.0–0.5)
Hematocrit: 37 % (ref 34–49)
Hemoglobin: 11.2 g/dL (ref 11.2–16.0)
IMM Granulocytes #: 0 THOU/uL
IMM Granulocytes: 0.4 %
Lymph # K/uL: 1.7 THOU/uL (ref 1.0–5.0)
MCV: 97 fL (ref 75–100)
Mono # K/uL: 0.6 THOU/uL (ref 0.1–1.0)
Neut # K/uL: 3.1 THOU/uL (ref 1.5–6.5)
Nucl RBC # K/uL: 0 THOU/uL
Nucl RBC %: 0 /100{WBCs} (ref 0.0–0.2)
Platelets: 208 THOU/uL (ref 150–450)
RBC: 3.8 MIL/uL — ABNORMAL LOW (ref 4.0–5.5)
RDW: 17.5 % — ABNORMAL HIGH (ref 0.0–15.0)
Seg Neut %: 55 %
WBC: 5.6 THOU/uL (ref 3.5–11.0)

## 2023-10-02 LAB — COMPREHENSIVE METABOLIC PANEL
ALT: 20 U/L (ref 0–35)
AST: 22 U/L (ref 0–35)
Albumin: 4 g/dL (ref 3.5–5.2)
Alk Phos: 83 U/L (ref 35–105)
Anion Gap: 12 (ref 7–16)
Bilirubin,Total: 0.4 mg/dL (ref 0.0–1.2)
CO2: 24 mmol/L (ref 20–28)
Calcium: 9.6 mg/dL (ref 8.6–10.2)
Chloride: 104 mmol/L (ref 96–108)
Creatinine: 1.04 mg/dL — ABNORMAL HIGH (ref 0.51–0.95)
Glucose: 165 mg/dL — ABNORMAL HIGH (ref 60–99)
Lab: 12 mg/dL (ref 6–20)
Potassium: 3.4 mmol/L (ref 3.3–5.1)
Sodium: 140 mmol/L (ref 133–145)
Total Protein: 6.6 g/dL (ref 6.3–7.7)
eGFR BY CREAT: 56 — AB

## 2023-10-02 LAB — MAGNESIUM: Magnesium: 1.6 mg/dL (ref 1.6–2.5)

## 2023-10-02 NOTE — Progress Notes (Signed)
 Patient arrived for port draw. IVAD accessed, flushes well with brisk blood return. Standing labs drawn and collected. IVAD flushed with saline and de-accessed. Returning in two weeks- AVS declined, uses mychart.

## 2023-10-05 LAB — INHIBIN B: Inhibin B: 41 pg/mL — ABNORMAL HIGH (ref <=11)

## 2023-10-15 ENCOUNTER — Other Ambulatory Visit: Payer: Self-pay

## 2023-10-16 ENCOUNTER — Ambulatory Visit: Attending: Hematology

## 2023-10-16 DIAGNOSIS — D3911 Neoplasm of uncertain behavior of right ovary: Secondary | ICD-10-CM | POA: Insufficient documentation

## 2023-10-16 LAB — COMPREHENSIVE METABOLIC PANEL
ALT: 18 U/L (ref 0–35)
AST: 23 U/L (ref 0–35)
Albumin: 3.9 g/dL (ref 3.5–5.2)
Alk Phos: 84 U/L (ref 35–105)
Anion Gap: 14 (ref 7–16)
Bilirubin,Total: 0.3 mg/dL (ref 0.0–1.2)
CO2: 24 mmol/L (ref 20–28)
Calcium: 9 mg/dL (ref 8.6–10.2)
Chloride: 106 mmol/L (ref 96–108)
Creatinine: 1.18 mg/dL — ABNORMAL HIGH (ref 0.51–0.95)
Glucose: 124 mg/dL — ABNORMAL HIGH (ref 60–99)
Lab: 15 mg/dL (ref 6–20)
Potassium: 3.5 mmol/L (ref 3.3–5.1)
Sodium: 144 mmol/L (ref 133–145)
Total Protein: 6.4 g/dL (ref 6.3–7.7)
eGFR BY CREAT: 48 — AB

## 2023-10-16 LAB — MAGNESIUM
Magnesium: 1.8 mg/dL (ref 1.6–2.5)
Magnesium: 1.7 mg/dL (ref 1.6–2.5)

## 2023-10-16 LAB — FOLATE: Folate: >14 ng/mL (ref >=4.6)

## 2023-10-16 LAB — VITAMIN B12: Vitamin B12: 331 pg/mL (ref 232–1245)

## 2023-10-16 NOTE — Progress Notes (Signed)
 Patient presents for port draw. IVAD accessed and blood return noted. Blood samples collected and sent to lab. IVAD saline flushed and deaccessed, site benign. Tolerated well.Lab unable to process CBC - Julie Walsh called and informed that additional port draw was scheduled for 9/18 after her office appointment with Dr. Shlomo. Patient verbalized understanding.

## 2023-10-17 ENCOUNTER — Other Ambulatory Visit: Payer: Self-pay

## 2023-10-17 ENCOUNTER — Encounter: Payer: Self-pay | Admitting: Hematology

## 2023-10-17 NOTE — Progress Notes (Unsigned)
 Chief Compliant  Julie Walsh is a 73 y.o. female who presents to the Gynecologic Oncology Clinic at [facility] on 10/18/2023 for an initial medical oncology consultation.Gynecologic Oncologst: Dr. Josefina. [name] History of Present Illness Julie Walsh is a 73 y.o. female with a history of recurrent granulosa cell ovarian cancer. Initially diagnosed in 03/2010 after presenting with PMB. She is s/p laparoscopic BSO in 2012, and s/p ex-lap, resection of cecum and terminal ileum with primary reanastomosis, appendectomy, TAH, optimal tumor debulking, mobilization of the hepatic flexure and LOH in 2020. She received adjuvant chemotherapy (Carbo/Taxol ) and started maintenance letrozole  in 03/2019-present. She underwent SBRT in 03/2022 for left pelvic lesion, and is now s/p ex-lap with small bowel resection with reanastomosis, peritoneal implants, removal of portion of omentum, LOH with oversewing serotomy, and optimal microscopic cytoreduction on 06/05/23. She then received 3 cycles of adjuvant Carbo/Taxol . She had a syncopal episode with C#1 and was found to have pneumonia, and Taxol  was dose reduced with C#2 due to worsening neuropathy. She had repeat CT scans on after 4 cycles due to slowly declining Inhibin B levels which showed concern for disease progression. She presents today s/p C#1 of palbociclib  and letrozole . She feels she is tolerating it really well.  Fatigue is a bit better. No infectious symptoms this month. Diarrhea is still intermittent but doesn't seem to be worse. No worsening of gait unsteadiness. Is resting > half the dya but does make a point to do laps around the house. Getting labs today. Oncologic History Oncology History Overview Note 11/15/2010  - D&C, BSO (Dr. Medford): Right ovary: granulosa cell tumor . Left ovary: benign. Uterus: hyperplasia without atypia. 07/2018 -  ex-lap, resection of cecum and terminal ileum with primary reanastomosis,  appendectomy, total abdominal hysterectomy, optimal tumor debulking, mobilization of the hepatic flexure and lysis of adhesions             12 cm granulosa cell tumor, present on serosal surfaces.Had adjuvant chemotherapy (Carbo/Taxol ) 03/2019 - began Letrozole4/4/23 - Inhibin B: 43. 11/24/21 - MRI Pelvis: 1.3 cm cystic structure in left adnexa, enhancement, nonspecific11/6/23 - CT: 2.5 cm left adnexal lesion, stable. 01/26/22 - PET/CT: 2.5 cm lesion in left adnexa with minimal FDG uptake. 01/20/22 - Inhibin B = 63 (normal is <11)03/22/22--03/28/22 EBRT Left Pelvis 4000 cGy Primary ovarian granulosa cell tumor, right 09/27/2023 Initial Diagnosis  Primary ovarian granulosa cell tumor, right 09/27/2023 -  Chemotherapy  OP Breast Palbociclib  (Ibrance ) and Aromatase InhibitorPlan Provider: Hubert KATHEE Bihari, MD, PhDTreatment goal: PalliativeLine of treatment: Second Line Medical History  PMX, SH, SurgHx, Allergies, Medications Reviewed at todays visit and pertinent changes recorded. Review of Systems See HPIPhysical Exam BP 121/70 (BP Location: Left arm, Patient Position: Sitting, Cuff Size: adult)   Pulse 82   Temp 36.4 C (97.5 F) (Temporal)   Resp 18   Ht 152.4 cm (5')   Wt 67.6 kg (149 lb)   SpO2 96%   BMI 29.10 kg/m GENERAL: well-appearing, in no acute distressHEENT: NC/ATNECK: no palpable cervical or supraclavicular lymph nodesLUNGS: clear to auscultation bilaterally, no wheezes/rales/rhonchiHEART: regular rate and rhythm, normal S1 and S2, no murmurs/rub/gallopABDOMEN: soft, non-tender, non-distended, normal bowel soundsEXTREMITIES: symmetrical, no pedal edema or calf tendernessSKIN: no visible lesions or rashesNEURO: alert and oriented, no focal neurologic deficits, independent gaitVS Reviewed.ECOG PERFORMANCE STATUS*Grade ECOG 0 Fully active, able to carry on all pre-disease performance without restriction 1  Restricted in physically strenuous activity but ambulatory and able to carry out work of a light or sedentary  nature, e.g., light house work, office work 2 Ambulatory and capable of all selfcare but unable to carry out any work activities. Up and about more than 50% of waking hours 3 Capable of only limited selfcare, confined to bed or chair more than 50% of waking hours 4 Completely disabled. Cannot carry on any selfcare. Totally confined to bed or chair 5 Dead * As published in Am. J. Clin. Oncol.:Oken, M.M., Leanne, R.H., [place], D.C., Bari, DOROTHA Nicholaus HOOVER Karie DONNA Polo, P.P.: Toxicity And Response Criteria Of The [facility]. Am JINNY Bridges Nwrno 4:350-344, 1982.Laboratory Data   Lab results: 09/02/251009 WBC 5.6 Hemoglobin 11.2 Hematocrit 37 RBC 3.8* Platelets 208 Neut # K/uL 3.1 Lymph # K/uL 1.7 Mono # K/uL 0.6 Eos # K/uL 0.1 Baso # K/uL 0.0 Seg Neut % 55.0   Lab results: 09/16/251018 Sodium 144 Potassium 3.5 Chloride 106 CO2 24 UN 15 Creatinine 1.18* Glucose 124* Calcium  9.0 Total Protein 6.4 Albumin 3.9 ALT 18 AST 23 Alk Phos 84 Bilirubin,Total 0.3 Pathology **ORIGINAL REPORT DATE: 06/14/2023** A. Small bowel, implant, biopsy:  -Granulosa cell tumor. B. Transverse colon, implant, biopsy:  -Granulosa cell tumor. C. Pelvic, small bowel implant, biopsy:  -Benign adipose tissue with hemorrhage. D. Small bowel implants #2, biopsy:  -Granulosa cell tumor. E. Omentum, partial omentectomy:  -Adipose tissue involved by scattered small foci of granulosa cell tumor. F. Colonic implant, biopsy:  -Fibroadipose tissue involved by scattered small foci of atypical cells, suspicious for granulosa cell tumor. G. Soft tissue, right upper quadrant mass, resection:  -Two benign lymph nodes (0/2).  -Benign adipose tissue. H. Small bowel, mass, resection:   -Granulosa cell tumor, with extensive intratumoral hemorrhage and cystic formation, predominantly involving small intestinal serosa/subserosa and muscularis propria.  -Background small intestinal mucosa with mild intraepithelial lymphocytosis.  -Resection margins negative for tumor.  -See comment. COMMENT: The patient's reported history of recurrent granulosa cell tumor status post treatment is noted. The tumor cells demonstrate classic morphology featuring Small, bland, cuboidal to polygonal cells with scant cytoplasm and pale, uniform angulated and usually grooved nuclei, growing in microfollicular, pseudopapillary, trabecular and corded patterns, with extensive intratumoral hemorrhage and cystic formation. Immunostains performed on Block H3 show tumor cells to be positive for SF1 and inhibin, focal weakly positive for Calretinin, AE1/AE3, CAM5.2, and EMA, while negative for CK7. The tumor morphology and immunoprofile are compatible with known history of granulosa cell tumor. Intradepartmental consultation obtained. Imaging Data All imaging data was personally reviewed by me and findings are per the HPI.Assessment  Ms. Bokhari is a 35 with recurrent, metastatic ovarian granulosa cell cancer.  We have discussed the incurable/indolent nature of this disease. We reviewed that any treatment would be given with the intention of helping her live longer and better. We discussed the alternative to treatment, which is best supportive care-- and that given the slow growth rate of this cancer, some people may be able to live years without treatment.  She is now s/p C#1 of palbociclib +letrozole  with good tolerance thus far. Plan Ovarian Granulosa Cell Cancer- Proceed with C#2 palbociclib  (21/28 d) and daily letrozole . - Continue labs every other week for the next month - Labs reviewed today, and we are obtaining additional labs- Inhibin B in process  - CTs in  ~2 months (ordered today)- Other future options: bevacizumab may be an option (but concerned to use now given risk of falls and concurrent blood thinner use), other hormonal blocking agents, and clinical trials (none currently open)Other Issues- Prior DVT:  Eliquis  lifelongRTC in ~4 Remi Bihari MD, PHDAssistant Professor of Medicine and OncologyDivision of Gynecologic Oncology This patient encounter meets a high level of medical decision making. I had the opportunity to review, address, and impact one acute or chronic illness (active malignancy) that poses a threat to bodily function. The patient is on active cancer treatment that needs intensive monitoring for toxicity. I had the opportunity do at least 2 of the 3: to review results of unique tests or ordering unique tests or review prior external notes in Epic; and independent interpretation of tests or discussion of management or test interpretation with external physicians/other qualified health care professional/appropriate source.

## 2023-10-18 ENCOUNTER — Ambulatory Visit

## 2023-10-18 ENCOUNTER — Encounter: Payer: Self-pay | Admitting: Hematology

## 2023-10-18 ENCOUNTER — Ambulatory Visit: Attending: Hematology | Admitting: Hematology

## 2023-10-18 ENCOUNTER — Telehealth: Payer: Self-pay

## 2023-10-18 VITALS — BP 121/70 | HR 82 | Temp 97.5°F | Resp 18 | Ht 60.0 in | Wt 149.0 lb

## 2023-10-18 DIAGNOSIS — Z09 Encounter for follow-up examination after completed treatment for conditions other than malignant neoplasm: Secondary | ICD-10-CM | POA: Insufficient documentation

## 2023-10-18 DIAGNOSIS — C563 Malignant neoplasm of bilateral ovaries: Secondary | ICD-10-CM | POA: Insufficient documentation

## 2023-10-18 DIAGNOSIS — D3911 Neoplasm of uncertain behavior of right ovary: Secondary | ICD-10-CM | POA: Insufficient documentation

## 2023-10-18 DIAGNOSIS — C569 Malignant neoplasm of unspecified ovary: Secondary | ICD-10-CM

## 2023-10-18 DIAGNOSIS — R197 Diarrhea, unspecified: Secondary | ICD-10-CM | POA: Insufficient documentation

## 2023-10-18 LAB — MAGNESIUM: Magnesium: 1.9 mg/dL (ref 1.6–2.5)

## 2023-10-18 LAB — COMPREHENSIVE METABOLIC PANEL
ALT: 17 U/L (ref 0–35)
AST: 23 U/L (ref 0–35)
Albumin: 4 g/dL (ref 3.5–5.2)
Alk Phos: 77 U/L (ref 35–105)
Anion Gap: 12 (ref 7–16)
Bilirubin,Total: 0.3 mg/dL (ref 0.0–1.2)
CO2: 23 mmol/L (ref 20–28)
Calcium: 9.3 mg/dL (ref 8.6–10.2)
Chloride: 106 mmol/L (ref 96–108)
Creatinine: 1.15 mg/dL — ABNORMAL HIGH (ref 0.51–0.95)
Glucose: 118 mg/dL — ABNORMAL HIGH (ref 60–99)
Lab: 13 mg/dL (ref 6–20)
Potassium: 3.7 mmol/L (ref 3.3–5.1)
Sodium: 141 mmol/L (ref 133–145)
Total Protein: 6.6 g/dL (ref 6.3–7.7)
eGFR BY CREAT: 50 — AB

## 2023-10-18 LAB — CBC AND DIFFERENTIAL
Baso # K/uL: 0 THOU/uL (ref 0.0–0.2)
Eos # K/uL: 0 THOU/uL (ref 0.0–0.5)
Hematocrit: 32 % — ABNORMAL LOW (ref 34–49)
Hemoglobin: 10.2 g/dL — ABNORMAL LOW (ref 11.2–16.0)
Lymph # K/uL: 1.3 THOU/uL (ref 1.0–5.0)
MCV: 98 fL (ref 75–100)
Mono # K/uL: 0.1 THOU/uL (ref 0.1–1.0)
Neut # K/uL: 0.9 THOU/uL — ABNORMAL LOW (ref 1.5–6.5)
Nucl RBC # K/uL: 0 THOU/uL
Nucl RBC %: 0 /100{WBCs} (ref 0.0–0.2)
Platelets: 110 THOU/uL — ABNORMAL LOW (ref 150–450)
RBC: 3.2 MIL/uL — ABNORMAL LOW (ref 4.0–5.5)
RDW: 17.4 % — ABNORMAL HIGH (ref 0.0–15.0)
Seg Neut %: 37.6 %
WBC: 2.3 THOU/uL — ABNORMAL LOW (ref 3.5–11.0)

## 2023-10-18 LAB — RBC MORPHOLOGY

## 2023-10-18 LAB — DIFF MANUAL: Diff Based On: 117 {cells}

## 2023-10-18 MED ORDER — SODIUM CHLORIDE 0.9 % FLUSH FOR PUMPS *I*
30.0000 mL | INTRAVENOUS | Status: DC | PRN
Start: 2023-10-18 — End: 2023-10-18

## 2023-10-18 NOTE — Telephone Encounter (Signed)
 Spoke with Julie Walsh and reviewed blood work with her. Instructed to hold palbociclib  for 1 week and repeat blood work in 1 week. Once we get results back Dr. Shlomo will review and we will let her know if she can  restart. [name] verbalized understanding and stated she would repeat blood work and wait for instructions.

## 2023-10-18 NOTE — Progress Notes (Signed)
 Patient presents for Q2 weeks port draw. IVAD accessed with blood return noted. Standing labs drawn and sent to lab. IVAD saline flushed and de-accessed, site benign. Pt discharged ambulatory, AVS declined.

## 2023-10-20 LAB — INHIBIN B: Inhibin B: 47 pg/mL — ABNORMAL HIGH (ref <=11)

## 2023-10-21 LAB — INHIBIN B: Inhibin B: 50 pg/mL — ABNORMAL HIGH (ref <=11)

## 2023-10-29 ENCOUNTER — Other Ambulatory Visit: Payer: Self-pay

## 2023-10-30 ENCOUNTER — Ambulatory Visit: Attending: Hematology

## 2023-10-30 DIAGNOSIS — D3911 Neoplasm of uncertain behavior of right ovary: Secondary | ICD-10-CM | POA: Insufficient documentation

## 2023-10-30 LAB — CBC AND DIFFERENTIAL
Baso # K/uL: 0 THOU/uL (ref 0.0–0.2)
Eos # K/uL: 0 THOU/uL (ref 0.0–0.5)
Hematocrit: 33 % — ABNORMAL LOW (ref 34–49)
Hemoglobin: 10.8 g/dL — ABNORMAL LOW (ref 11.2–16.0)
IMM Granulocytes #: 0 THOU/uL (ref 0–0)
IMM Granulocytes: 0 %
Lymph # K/uL: 1.3 THOU/uL (ref 1.0–5.0)
MCV: 101 fL — ABNORMAL HIGH (ref 75–100)
Mono # K/uL: 0.5 THOU/uL (ref 0.1–1.0)
Neut # K/uL: 1.3 THOU/uL — ABNORMAL LOW (ref 1.5–6.5)
Nucl RBC # K/uL: 0 THOU/uL
Nucl RBC %: 0 /100{WBCs} (ref 0.0–0.2)
Platelets: 121 THOU/uL — ABNORMAL LOW (ref 150–450)
RBC: 3.3 MIL/uL — ABNORMAL LOW (ref 4.0–5.5)
RDW: 19.1 % — ABNORMAL HIGH (ref 0.0–15.0)
Seg Neut %: 42.1 %
WBC: 3.1 THOU/uL — ABNORMAL LOW (ref 3.5–11.0)

## 2023-10-30 LAB — COMPREHENSIVE METABOLIC PANEL
ALT: 23 U/L (ref 0–35)
AST: 26 U/L (ref 0–35)
Albumin: 4 g/dL (ref 3.5–5.2)
Alk Phos: 67 U/L (ref 35–105)
Anion Gap: 12 (ref 7–16)
Bilirubin,Total: 0.4 mg/dL (ref 0.0–1.2)
CO2: 26 mmol/L (ref 20–28)
Calcium: 9.4 mg/dL (ref 8.6–10.2)
Chloride: 104 mmol/L (ref 96–108)
Creatinine: 1.05 mg/dL — ABNORMAL HIGH (ref 0.51–0.95)
Glucose: 113 mg/dL — ABNORMAL HIGH (ref 60–99)
Lab: 14 mg/dL (ref 6–20)
Potassium: 3.8 mmol/L (ref 3.3–5.1)
Sodium: 142 mmol/L (ref 133–145)
Total Protein: 6.7 g/dL (ref 6.3–7.7)
eGFR BY CREAT: 55 — AB

## 2023-10-30 LAB — MAGNESIUM: Magnesium: 1.9 mg/dL (ref 1.6–2.5)

## 2023-10-30 NOTE — Progress Notes (Signed)
 Ivad flushes well, positive blood return,labs drawn and sent. Will be returning 10/14 for another port draw.

## 2023-11-08 ENCOUNTER — Other Ambulatory Visit: Payer: Self-pay

## 2023-11-09 ENCOUNTER — Encounter: Payer: Self-pay | Admitting: Internal Medicine

## 2023-11-12 ENCOUNTER — Other Ambulatory Visit: Payer: Self-pay

## 2023-11-13 ENCOUNTER — Ambulatory Visit: Attending: Hematology

## 2023-11-13 ENCOUNTER — Other Ambulatory Visit: Payer: Self-pay

## 2023-11-13 VITALS — BP 131/72 | HR 84 | Temp 97.3°F | Resp 17

## 2023-11-13 DIAGNOSIS — D3911 Neoplasm of uncertain behavior of right ovary: Secondary | ICD-10-CM | POA: Insufficient documentation

## 2023-11-13 LAB — CBC AND DIFFERENTIAL
Baso # K/uL: 0.1 THOU/uL (ref 0.0–0.2)
Eos # K/uL: 0.1 THOU/uL (ref 0.0–0.5)
Hematocrit: 34 % (ref 34–49)
Hemoglobin: 10.9 g/dL — ABNORMAL LOW (ref 11.2–16.0)
IMM Granulocytes #: 0 THOU/uL (ref 0–0)
IMM Granulocytes: 0.4 %
Lymph # K/uL: 1.3 THOU/uL (ref 1.0–5.0)
MCV: 100 fL (ref 75–100)
Mono # K/uL: 0.8 THOU/uL (ref 0.1–1.0)
Neut # K/uL: 3.3 THOU/uL (ref 1.5–6.5)
Nucl RBC # K/uL: 0 THOU/uL
Nucl RBC %: 0 /100{WBCs} (ref 0.0–0.2)
Platelets: 285 THOU/uL (ref 150–450)
RBC: 3.4 MIL/uL — ABNORMAL LOW (ref 4.0–5.5)
RDW: 17.3 % — ABNORMAL HIGH (ref 0.0–15.0)
Seg Neut %: 59.7 %
WBC: 5.5 THOU/uL (ref 3.5–11.0)

## 2023-11-13 LAB — COMPREHENSIVE METABOLIC PANEL
ALT: 26 U/L (ref 0–35)
AST: 28 U/L (ref 0–35)
Albumin: 3.9 g/dL (ref 3.5–5.2)
Alk Phos: 79 U/L (ref 35–105)
Anion Gap: 13 (ref 7–16)
Bilirubin,Total: 0.3 mg/dL (ref 0.0–1.2)
CO2: 24 mmol/L (ref 20–28)
Calcium: 9.2 mg/dL (ref 8.6–10.2)
Chloride: 104 mmol/L (ref 96–108)
Creatinine: 1 mg/dL — ABNORMAL HIGH (ref 0.51–0.95)
Glucose: 120 mg/dL — ABNORMAL HIGH (ref 60–99)
Lab: 11 mg/dL (ref 6–20)
Potassium: 3.9 mmol/L (ref 3.3–5.1)
Sodium: 141 mmol/L (ref 133–145)
Total Protein: 6.4 g/dL (ref 6.3–7.7)
eGFR BY CREAT: 58 — AB

## 2023-11-13 LAB — MAGNESIUM: Magnesium: 1.9 mg/dL (ref 1.6–2.5)

## 2023-11-13 MED ORDER — SODIUM CHLORIDE 0.9 % INJ (FLUSH) WRAPPED *I*
10.0000 mL | Status: DC | PRN
Start: 2023-11-13 — End: 2023-11-13

## 2023-11-13 NOTE — Progress Notes (Signed)
 Patient presents for port draw. IVAD accessed with blood return noted. Standing labs drawn and sent to lab. IVAD saline flushed and de-accessed, site benign. Pt discharged ambulatory, next appointment 10/20 for port draw. AVS declined.

## 2023-11-14 ENCOUNTER — Other Ambulatory Visit: Payer: Self-pay

## 2023-11-15 ENCOUNTER — Other Ambulatory Visit: Payer: Self-pay

## 2023-11-15 ENCOUNTER — Other Ambulatory Visit: Payer: Self-pay | Admitting: Pharmacist Clinician (PhC)/ Clinical Pharmacy Specialist

## 2023-11-15 ENCOUNTER — Ambulatory Visit: Attending: Hematology | Admitting: Hematology

## 2023-11-15 VITALS — BP 132/70 | HR 100 | Temp 97.9°F | Resp 20 | Ht 60.0 in | Wt 150.0 lb

## 2023-11-15 DIAGNOSIS — C561 Malignant neoplasm of right ovary: Secondary | ICD-10-CM | POA: Insufficient documentation

## 2023-11-15 DIAGNOSIS — Z09 Encounter for follow-up examination after completed treatment for conditions other than malignant neoplasm: Secondary | ICD-10-CM | POA: Insufficient documentation

## 2023-11-15 DIAGNOSIS — D3911 Neoplasm of uncertain behavior of right ovary: Secondary | ICD-10-CM

## 2023-11-15 DIAGNOSIS — C569 Malignant neoplasm of unspecified ovary: Secondary | ICD-10-CM

## 2023-11-15 DIAGNOSIS — Z79899 Other long term (current) drug therapy: Secondary | ICD-10-CM | POA: Insufficient documentation

## 2023-11-15 DIAGNOSIS — Z5181 Encounter for therapeutic drug level monitoring: Secondary | ICD-10-CM | POA: Insufficient documentation

## 2023-11-15 NOTE — Progress Notes (Addendum)
 Chief Compliant  Julie Walsh is a 73 y.o. female who presents to the Gynecologic Oncology Clinic at [facility] on 11/15/2023 for a follow-up medical oncology visit.Gynecologic Oncologst: Dr. Josefina. [name] History of Present Illness Julie Walsh is a 73 y.o. female with a history of recurrent granulosa cell ovarian cancer. Initially diagnosed in 03/2010 after presenting with PMB. She is s/p laparoscopic BSO in 2012, and s/p ex-lap, resection of cecum and terminal ileum with primary reanastomosis, appendectomy, TAH, optimal tumor debulking, mobilization of the hepatic flexure and LOH in 2020. She received adjuvant chemotherapy (Carbo/Taxol ) and started maintenance letrozole  in 03/2019-present. She underwent SBRT in 03/2022 for left pelvic lesion, and is now s/p ex-lap with small bowel resection with reanastomosis, peritoneal implants, removal of portion of omentum, LOH with oversewing serotomy, and optimal microscopic cytoreduction on 06/05/23. She then received 3 cycles of adjuvant Carbo/Taxol . She had a syncopal episode with C#1 and was found to have pneumonia, and Taxol  was dose reduced with C#2 due to worsening neuropathy. She had repeat CT scans on after 4 cycles due to slowly declining Inhibin B levels which showed concern for disease progression. She has continued on letrozole . She was transitioned to palbociclib  and completed about 2 weeks of C1 before this was held on 9/18 due to low ANC. Interval History:She presents today accompanied by her husband for consideration of C#2 palbociclib  and letrozole .She reports that she has been doing well overall. She feels that her energy level is improving. She has been able to be much more active than she was a week or two ago. She and her husband recently travelled to a family reunion and she was able to walk between terminals in the airport, which was at least half a mile. She feels more stable with regards to her gait. She has not had  any falls. Her hair is starting to return.She reports that she had been having some dysuria, so reached out to her PCP and had a UA performed, which was not infectious-appearing per her report; her symptoms resolved without treatment. She has not had any fevers. For the past 1 week she has had intermittent 1/10 RLQ discomfort, which increases to 5/10 pain with palpation over the RLQ. She notes that her appendix was previously removed. She is eating normally and has not had any nausea or vomiting. She still has intermittent diarrhea which is not bothersome to her. She has not had any diarrhea in the last few days. She sometimes has lower abdominal pain prior to a BM, and rectal pain during a BM with some blood with wiping that she attributes to hemorrhoids. She has not had any blood mixed with her stool nor black stools. Oncologic History Oncology History Overview Note 11/15/2010  - D&C, BSO (Dr. Medford): Right ovary: granulosa cell tumor . Left ovary: benign. Uterus: hyperplasia without atypia. 07/2018 -  ex-lap, resection of cecum and terminal ileum with primary reanastomosis, appendectomy, total abdominal hysterectomy, optimal tumor debulking, mobilization of the hepatic flexure and lysis of adhesions             12 cm granulosa cell tumor, present on serosal surfaces.Had adjuvant chemotherapy (Carbo/Taxol ) 03/2019 - began Letrozole4/4/23 - Inhibin B: 43. 11/24/21 - MRI Pelvis: 1.3 cm cystic structure in left adnexa, enhancement, nonspecific11/6/23 - CT: 2.5 cm left adnexal lesion, stable. 01/26/22 - PET/CT: 2.5 cm lesion in left adnexa with minimal FDG uptake. 01/20/22 - Inhibin B = 63 (normal is <11)03/22/22--03/28/22 EBRT Left Pelvis 4000 cGy Primary ovarian granulosa cell  tumor, right 09/27/2023 Initial Diagnosis  Primary ovarian granulosa cell tumor, right 09/27/2023 -  Chemotherapy  OP Breast Palbociclib  (Ibrance ) and Aromatase InhibitorPlan Provider:  Hubert KATHEE Bihari, MD, PhDTreatment goal: PalliativeLine of treatment: Second Line Medical History  PMX, SH, SurgHx, Allergies, Medications Reviewed at todays visit and pertinent changes recorded. Review of Systems See HPIPhysical Exam BP 132/70 (BP Location: Left arm, Patient Position: Sitting, Cuff Size: adult)   Pulse 100   Temp 36.6 C (97.9 F) (Temporal)   Resp 20   Ht 152.4 cm (5')   Wt 68 kg (150 lb)   SpO2 96%   BMI 29.29 kg/m GENERAL: well-appearing, in no acute distressHEENT: PERRL, anicteric sclerae. MMM.LUNGS: clear to auscultation bilaterally, no wheezes/rales/rhonchiHEART: regular rate and rhythmABDOMEN: mildly tender very focally in RLQ, but soft, non-distended, normal bowel soundsEXTREMITIES: trace left ankle edema, stable per patientSKIN: no visible lesions or rashesNEURO: alert and oriented, no focal neurologic deficits, independent gaitVS Reviewed.ECOG PERFORMANCE STATUS*Grade ECOG 0 Fully active, able to carry on all pre-disease performance without restriction 1 Restricted in physically strenuous activity but ambulatory and able to carry out work of a light or sedentary nature, e.g., light house work, office work 2 Ambulatory and capable of all selfcare but unable to carry out any work activities. Up and about more than 50% of waking hours 3 Capable of only limited selfcare, confined to bed or chair more than 50% of waking hours 4 Completely disabled. Cannot carry on any selfcare. Totally confined to bed or chair 5 Dead * As published in Am. J. Clin. Oncol.:Oken, M.M., Leanne, R.H., [place], D.C., Bari, DOROTHA Nicholaus HOOVER Karie DONNA Polo, P.P.: Toxicity And Response Criteria Of The [facility]. Am JINNY Bridges Nwrno 4:350-344, 1982.Laboratory Data   Lab results: 10/14/251015 WBC 5.5 Hemoglobin 10.9* Hematocrit 34 RBC 3.4* Platelets 285 Neut # K/uL 3.3 Lymph # K/uL 1.3  Mono # K/uL 0.8 Eos # K/uL 0.1 Baso # K/uL 0.1 Seg Neut % 59.7   Lab results: 10/14/251015 Sodium 141 Potassium 3.9 Chloride 104 CO2 24 UN 11 Creatinine 1.00* Glucose 120* Calcium  9.2 Total Protein 6.4 Albumin 3.9 ALT 26 AST 28 Alk Phos 79 Bilirubin,Total 0.3 Pathology **ORIGINAL REPORT DATE: 06/14/2023** A. Small bowel, implant, biopsy:  -Granulosa cell tumor. B. Transverse colon, implant, biopsy:  -Granulosa cell tumor. C. Pelvic, small bowel implant, biopsy:  -Benign adipose tissue with hemorrhage. D. Small bowel implants #2, biopsy:  -Granulosa cell tumor. E. Omentum, partial omentectomy:  -Adipose tissue involved by scattered small foci of granulosa cell tumor. F. Colonic implant, biopsy:  -Fibroadipose tissue involved by scattered small foci of atypical cells, suspicious for granulosa cell tumor. G. Soft tissue, right upper quadrant mass, resection:  -Two benign lymph nodes (0/2).  -Benign adipose tissue. H. Small bowel, mass, resection:  -Granulosa cell tumor, with extensive intratumoral hemorrhage and cystic formation, predominantly involving small intestinal serosa/subserosa and muscularis propria.  -Background small intestinal mucosa with mild intraepithelial lymphocytosis.  -Resection margins negative for tumor.  -See comment. COMMENT: The patient's reported history of recurrent granulosa cell tumor status post treatment is noted. The tumor cells demonstrate classic morphology featuring Small, bland, cuboidal to polygonal cells with scant cytoplasm and pale, uniform angulated and usually grooved nuclei, growing in microfollicular, pseudopapillary, trabecular and corded patterns, with extensive intratumoral hemorrhage and cystic formation. Immunostains performed on Block H3 show tumor cells to be positive for SF1 and inhibin, focal weakly positive for Calretinin,  AE1/AE3, CAM5.2, and EMA, while negative for CK7. The tumor  morphology and immunoprofile are compatible with known history of granulosa cell tumor. Intradepartmental consultation obtained. Imaging Data All imaging data was personally reviewed by me and findings are per the HPI.Assessment  Ms. Boulter is a 11 with recurrent, metastatic ovarian granulosa cell cancer.  We have discussed the incurable/indolent nature of this disease. We reviewed that any treatment would be given with the intention of helping her live longer and better. We discussed the alternative to treatment, which is best supportive care-- and that given the slow growth rate of this cancer, some people may be able to live years without treatment.  She completed about 2 weeks of C1 palbociclib  + letrozole  before palbociclib  was held on 9/18 due to low ANC.  Counts have now improved, so will resume with C2 palbociclib  + letrozole  today.Plan Ovarian Granulosa Cell Cancer- Proceed with C#2 palbociclib  (21/28 d) and daily letrozole . - Continue labs weekly for the next month; if counts drop again, consider dose reduction of palbociclib - Inhibin B was 50 on 9/18; level from today still in process - CTs scheduled for November 2025- Other future options: bevacizumab may be an option (but concerned to use now given risk of falls and concurrent blood thinner use), other hormonal blocking agents, and clinical trials (none currently open)Other Issues- Prior DVT: Eliquis  lifelong- RLQ abdominal pain: monitor for now, discussed to call with any red flag symptoms such as worsening pain, nausea, or feverRTC in ~4 weeksPatient seen/examined and plan formulated with attending physician, Dr. Hubert Bihari; attestation to follow.Josette Pitt, MD, PhDHematology/Oncology Fellow, PGY-4I saw and evaluated [name] DELENA Burnard with Dr. Pitt. I agree with the assessment and plan as documented above and have  edited the note to reflect my own input. I have performed a substantive portion of the visit including developing and discussing the management plan. Hubert Bihari MD, PHDAssociate Professor of Medicine and OncologyDivision of Gynecologic Oncology This patient encounter meets a high level of medical decision making. I had the opportunity to review, address, and impact one acute or chronic illness (active malignancy) that poses a threat to bodily function. The patient is on active cancer treatment that needs intensive monitoring for toxicity. I had the opportunity do at least 2 of the 3: to review results of unique tests or ordering unique tests or review prior external notes in Epic; and independent interpretation of tests or discussion of management or test interpretation with external physicians/other qualified health care professional/appropriate source.

## 2023-11-15 NOTE — Patient Instructions (Signed)
 October 2025  Sunday Monday Tuesday Wednesday Thursday Friday Saturday     1 2 3 4 5 6 7 8 9 10 11 12  13Happy Dortha! 14PORT DRAW10:00 AM (30 min.) Skeet Barth, RN [facility] 15 16IbranceCHEMO FOLLOW UP10:00 AM (30 min.) Shlomo Hubert NOVAK, MD, PhD [facility] 17Ibrance 18Ibrance 19Ibrance 20Ibrance 21IbrancePORT DRAW 1:30 PM (30 min.) CAN CTR, HH POD G [facility] 22Ibrance 23Ibrance 24Ibrance 25Ibrance 26Ibrance 27Ibrance 28IbrancePORT DRAW10:00 AM (30 min.) CAN [facility] CenterPORT DRAW 1:00 PM (30 min.) CAN CTR, HH POD D [facility] 29Ibrance 30Ibrance 31Ibrance    November 2025  Sunday Monday Tuesday Wednesday Thursday Friday Saturday        1Ibrance 2Ibrance 3Ibrance 4IbrancePORT DRAW 1:00 PM (30 min.) CAN CTR, HH POD B [facility] 5Ibrance 6Off--> 7 8 9 10 11 12  13Next cycle 14 15 16 17  18CT Abd Pelvis With Contrast 2:00 PM (15 min.) RIS, RR CT2 (RRCT2) [facility] 19 20CHEMO FOLLOW UP 9:30 AM (30 min.) Shlomo Hubert NOVAK, MD, PhD [facility] at [facility] 21 22 23 24 25 26 27 28 29  30

## 2023-11-17 LAB — INHIBIN B: Inhibin B: 60 pg/mL — ABNORMAL HIGH (ref <=11)

## 2023-11-19 ENCOUNTER — Other Ambulatory Visit: Payer: Self-pay

## 2023-11-20 ENCOUNTER — Ambulatory Visit: Attending: Hematology

## 2023-11-20 ENCOUNTER — Other Ambulatory Visit: Payer: Self-pay

## 2023-11-20 VITALS — BP 125/70 | HR 84 | Temp 98.1°F | Resp 16

## 2023-11-20 DIAGNOSIS — D3911 Neoplasm of uncertain behavior of right ovary: Secondary | ICD-10-CM

## 2023-11-20 DIAGNOSIS — D3910 Neoplasm of uncertain behavior of unspecified ovary: Secondary | ICD-10-CM | POA: Insufficient documentation

## 2023-11-20 LAB — COMPREHENSIVE METABOLIC PANEL
ALT: 24 U/L (ref 0–35)
AST: 32 U/L (ref 0–35)
Albumin: 4 g/dL (ref 3.5–5.2)
Alk Phos: 74 U/L (ref 35–105)
Anion Gap: 10 (ref 7–16)
Bilirubin,Total: 0.4 mg/dL (ref 0.0–1.2)
CO2: 27 mmol/L (ref 20–28)
Calcium: 9.6 mg/dL (ref 8.6–10.2)
Chloride: 104 mmol/L (ref 96–108)
Creatinine: 1.13 mg/dL — ABNORMAL HIGH (ref 0.51–0.95)
Glucose: 133 mg/dL — ABNORMAL HIGH (ref 60–99)
Lab: 13 mg/dL (ref 6–20)
Potassium: 4 mmol/L (ref 3.3–5.1)
Sodium: 141 mmol/L (ref 133–145)
Total Protein: 6.8 g/dL (ref 6.3–7.7)
eGFR BY CREAT: 50 — AB

## 2023-11-20 LAB — CBC AND DIFFERENTIAL
Baso # K/uL: 0.1 THOU/uL (ref 0.0–0.2)
Eos # K/uL: 0.2 THOU/uL (ref 0.0–0.5)
Hematocrit: 35 % (ref 34–49)
Hemoglobin: 11.1 g/dL — ABNORMAL LOW (ref 11.2–16.0)
Lymph # K/uL: 1.1 THOU/uL (ref 1.0–5.0)
MCV: 100 fL (ref 75–100)
Mono # K/uL: 0 THOU/uL — ABNORMAL LOW (ref 0.1–1.0)
Neut # K/uL: 4.4 THOU/uL (ref 1.5–6.5)
Nucl RBC # K/uL: 0 THOU/uL
Nucl RBC %: 0 /100{WBCs} (ref 0.0–0.2)
Platelets: 202 THOU/uL (ref 150–450)
RBC: 3.5 MIL/uL — ABNORMAL LOW (ref 4.0–5.5)
RDW: 16.4 % — ABNORMAL HIGH (ref 0.0–15.0)
Seg Neut %: 77.2 %
WBC: 5.7 THOU/uL (ref 3.5–11.0)

## 2023-11-20 LAB — DIFF MANUAL
Diff Based On: 114 {cells}
React Lymph %: 0.9 % (ref 0.0–6.0)

## 2023-11-20 LAB — MAGNESIUM: Magnesium: 1.8 mg/dL (ref 1.6–2.5)

## 2023-11-20 LAB — RBC MORPHOLOGY

## 2023-11-20 MED ORDER — SODIUM CHLORIDE 0.9 % INJ (FLUSH) WRAPPED *I*
10.0000 mL | Status: DC | PRN
Start: 2023-11-20 — End: 2023-11-20
  Administered 2023-11-20: 30 mL

## 2023-11-20 NOTE — Progress Notes (Signed)
 Patient arrived for weekly [facility]. Husband Ray chairside for the entire visit. Patient has no concerns at this time. IVAD flushed well with brisk blood return. Standing labs drawn and collected. IVAD flushed with normal saline and de-accessed. NOT heparin  flushed because the patient is returning prior to two months. Patient returning 10/28, aware and amenable - declined AVS printout.

## 2023-11-24 LAB — INHIBIN B: Inhibin B: 125 pg/mL — ABNORMAL HIGH (ref <=11)

## 2023-11-26 ENCOUNTER — Other Ambulatory Visit: Payer: Self-pay

## 2023-11-27 ENCOUNTER — Ambulatory Visit: Attending: Hematology

## 2023-11-27 ENCOUNTER — Ambulatory Visit

## 2023-11-27 ENCOUNTER — Other Ambulatory Visit: Payer: Self-pay

## 2023-11-27 DIAGNOSIS — D3911 Neoplasm of uncertain behavior of right ovary: Secondary | ICD-10-CM | POA: Insufficient documentation

## 2023-11-27 LAB — COMPREHENSIVE METABOLIC PANEL
ALT: 19 U/L (ref 0–35)
AST: 26 U/L (ref 0–35)
Albumin: 3.9 g/dL (ref 3.5–5.2)
Alk Phos: 66 U/L (ref 35–105)
Anion Gap: 12 (ref 7–16)
Bilirubin,Total: 0.4 mg/dL (ref 0.0–1.2)
CO2: 25 mmol/L (ref 20–28)
Calcium: 9.4 mg/dL (ref 8.6–10.2)
Chloride: 106 mmol/L (ref 96–108)
Creatinine: 1.15 mg/dL — ABNORMAL HIGH (ref 0.51–0.95)
Glucose: 122 mg/dL — ABNORMAL HIGH (ref 60–99)
Lab: 15 mg/dL (ref 6–20)
Potassium: 3.6 mmol/L (ref 3.3–5.1)
Sodium: 143 mmol/L (ref 133–145)
Total Protein: 6.5 g/dL (ref 6.3–7.7)
eGFR BY CREAT: 49 — AB

## 2023-11-27 LAB — CBC AND DIFFERENTIAL
Baso # K/uL: 0 THOU/uL (ref 0.0–0.2)
Eos # K/uL: 0 THOU/uL (ref 0.0–0.5)
Hematocrit: 33 % — ABNORMAL LOW (ref 34–49)
Hemoglobin: 10.6 g/dL — ABNORMAL LOW (ref 11.2–16.0)
Lymph # K/uL: 1.2 THOU/uL (ref 1.0–5.0)
MCV: 101 fL — ABNORMAL HIGH (ref 75–100)
Mono # K/uL: 0.1 THOU/uL (ref 0.1–1.0)
Neut # K/uL: 1.3 THOU/uL — ABNORMAL LOW (ref 1.5–6.5)
Nucl RBC # K/uL: 0 THOU/uL
Nucl RBC %: 0 /100{WBCs} (ref 0.0–0.2)
Platelets: 172 THOU/uL (ref 150–450)
RBC: 3.3 MIL/uL — ABNORMAL LOW (ref 4.0–5.5)
RDW: 16.6 % — ABNORMAL HIGH (ref 0.0–15.0)
Seg Neut %: 49.6 %
WBC: 2.6 THOU/uL — ABNORMAL LOW (ref 3.5–11.0)

## 2023-11-27 LAB — DIFF MANUAL: Diff Based On: 107 {cells}

## 2023-11-27 LAB — RBC MORPHOLOGY

## 2023-11-27 LAB — MAGNESIUM: Magnesium: 1.8 mg/dL (ref 1.6–2.5)

## 2023-11-27 NOTE — Progress Notes (Signed)
 Patient presents for port draw. IVAD accessed and blood return noted. Blood samples collected and sent to lab. IVAD saline flushed and deaccessed, site benign. Tolerated well.

## 2023-12-01 LAB — INHIBIN B: Inhibin B: 51 pg/mL — ABNORMAL HIGH (ref <=11)

## 2023-12-03 ENCOUNTER — Other Ambulatory Visit: Payer: Self-pay

## 2023-12-04 ENCOUNTER — Other Ambulatory Visit: Payer: Self-pay | Admitting: Hematology

## 2023-12-04 ENCOUNTER — Ambulatory Visit: Attending: Hematology

## 2023-12-04 ENCOUNTER — Other Ambulatory Visit: Payer: Self-pay

## 2023-12-04 DIAGNOSIS — D3911 Neoplasm of uncertain behavior of right ovary: Secondary | ICD-10-CM

## 2023-12-04 LAB — COMPREHENSIVE METABOLIC PANEL
ALT: 22 U/L (ref 0–35)
AST: 26 U/L (ref 0–35)
Albumin: 4 g/dL (ref 3.5–5.2)
Alk Phos: 67 U/L (ref 35–105)
Anion Gap: 11 (ref 7–16)
Bilirubin,Total: 0.4 mg/dL (ref 0.0–1.2)
CO2: 25 mmol/L (ref 20–28)
Calcium: 9.3 mg/dL (ref 8.6–10.2)
Chloride: 104 mmol/L (ref 96–108)
Creatinine: 1.2 mg/dL — ABNORMAL HIGH (ref 0.51–0.95)
Glucose: 115 mg/dL — ABNORMAL HIGH (ref 60–99)
Lab: 15 mg/dL (ref 6–20)
Potassium: 3.5 mmol/L (ref 3.3–5.1)
Sodium: 140 mmol/L (ref 133–145)
Total Protein: 6.5 g/dL (ref 6.3–7.7)
eGFR BY CREAT: 47 — AB

## 2023-12-04 LAB — RBC MORPHOLOGY

## 2023-12-04 LAB — CBC AND DIFFERENTIAL
Baso # K/uL: 0 THOU/uL (ref 0.0–0.2)
Eos # K/uL: 0 THOU/uL (ref 0.0–0.5)
Hematocrit: 33 % — ABNORMAL LOW (ref 34–49)
Hemoglobin: 10.7 g/dL — ABNORMAL LOW (ref 11.2–16.0)
Lymph # K/uL: 1.5 THOU/uL (ref 1.0–5.0)
MCV: 101 fL — ABNORMAL HIGH (ref 75–100)
Mono # K/uL: 0.1 THOU/uL (ref 0.1–1.0)
Neut # K/uL: 0.8 THOU/uL — ABNORMAL LOW (ref 1.5–6.5)
Nucl RBC # K/uL: 0 THOU/uL
Nucl RBC %: 0 /100{WBCs} (ref 0.0–0.2)
Platelets: 99 THOU/uL — ABNORMAL LOW (ref 150–450)
RBC: 3.2 MIL/uL — ABNORMAL LOW (ref 4.0–5.5)
RDW: 17.6 % — ABNORMAL HIGH (ref 0.0–15.0)
Seg Neut %: 33 %
WBC: 2.4 THOU/uL — ABNORMAL LOW (ref 3.5–11.0)

## 2023-12-04 LAB — DIFF MANUAL
Diff Based On: 115 {cells}
React Lymph %: 2.6 % (ref 0.0–6.0)

## 2023-12-04 LAB — MAGNESIUM: Magnesium: 1.7 mg/dL (ref 1.6–2.5)

## 2023-12-04 MED ORDER — PALBOCICLIB 100 MG PO TABS *A*
100.0000 mg | ORAL_TABLET | Freq: Every day | ORAL | 3 refills | Status: DC
  Filled 2023-12-04: qty 21, 28d supply, fill #0
  Filled 2023-12-31 – 2024-01-18 (×2): qty 21, 28d supply, fill #1

## 2023-12-04 NOTE — Progress Notes (Signed)
 Weekly Port Draw- Ivad flushes well, positive blood return,hand deaccessed. Labs drawn and sent . Return appointment next week.

## 2023-12-04 NOTE — Progress Notes (Signed)
 Neutrophils <1000 at the completion of 3 weeks on treatment. Will need to lower the dose for the next cycle. New Rx sent to specialty.

## 2023-12-05 ENCOUNTER — Other Ambulatory Visit: Payer: Self-pay

## 2023-12-07 ENCOUNTER — Other Ambulatory Visit: Payer: Self-pay

## 2023-12-07 NOTE — Patient Instructions (Signed)
 November 2025  Sunday Monday Tuesday Wednesday Thursday Friday Saturday        1 2 3  4PORT DRAW 9:30 AM (30 min.) Hoadley, Amy B, RN [facility] 5 6 7 8 9 10  11PORT DRAW10:00 AM (30 min.) CAN CTR, HH POD C [facility] 12 13Ibrance 14Ibrance 15Ibrance 16Ibrance 17Ibrance 18IbranceCT Abd Pelvis With Contrast 2:00 PM (15 min.) RIS, RR CT2 (RRCT2) [place] [facility] 19Ibrance 20IbranceCHEMO FOLLOW UP 9:30 AM (30 min.) Shlomo Hubert NOVAK, MD, PhD [facility] 25Ibrance [place] 27Ibrance 28Ibrance 29Ibrance 30Ibrance         December 2025  Sunday Monday Tuesday Wednesday Thursday Friday Saturday   1Ibrance 2Ibrance 3Ibrance 4OFF --> 5 6 7 8 9 10  11Next cycle due 12 13 14 15 16 17 18 19 20 21 22 23 24 25 26 27 28 29 30  31

## 2023-12-10 ENCOUNTER — Other Ambulatory Visit: Payer: Self-pay

## 2023-12-11 ENCOUNTER — Other Ambulatory Visit: Payer: Self-pay

## 2023-12-11 ENCOUNTER — Other Ambulatory Visit: Payer: Self-pay | Admitting: Hematology

## 2023-12-11 ENCOUNTER — Ambulatory Visit

## 2023-12-11 ENCOUNTER — Encounter: Payer: Self-pay | Admitting: Hematology

## 2023-12-11 ENCOUNTER — Telehealth: Payer: Self-pay

## 2023-12-11 VITALS — BP 123/66 | HR 73 | Temp 97.2°F | Resp 18

## 2023-12-11 DIAGNOSIS — D3911 Neoplasm of uncertain behavior of right ovary: Secondary | ICD-10-CM | POA: Insufficient documentation

## 2023-12-11 LAB — CBC AND DIFFERENTIAL
Baso # K/uL: 0 THOU/uL (ref 0.0–0.2)
Eos # K/uL: 0 THOU/uL (ref 0.0–0.5)
Hematocrit: 33 % — ABNORMAL LOW (ref 34–49)
Hemoglobin: 10.9 g/dL — ABNORMAL LOW (ref 11.2–16.0)
Lymph # K/uL: 1.3 THOU/uL (ref 1.0–5.0)
MCV: 102 fL — ABNORMAL HIGH (ref 75–100)
Mono # K/uL: 0.1 THOU/uL (ref 0.1–1.0)
Neut # K/uL: 0.6 THOU/uL — ABNORMAL LOW (ref 1.5–6.5)
Nucl RBC # K/uL: 0 THOU/uL
Nucl RBC %: 0 /100{WBCs} (ref 0.0–0.2)
Platelets: 89 THOU/uL — ABNORMAL LOW (ref 150–450)
RBC: 3.3 MIL/uL — ABNORMAL LOW (ref 4.0–5.5)
RDW: 18.5 % — ABNORMAL HIGH (ref 0.0–15.0)
Seg Neut %: 29.3 %
WBC: 2 THOU/uL — ABNORMAL LOW (ref 3.5–11.0)

## 2023-12-11 LAB — RBC MORPHOLOGY

## 2023-12-11 LAB — COMPREHENSIVE METABOLIC PANEL
ALT: 24 U/L (ref 0–35)
AST: 28 U/L (ref 0–35)
Albumin: 4.1 g/dL (ref 3.5–5.2)
Alk Phos: 73 U/L (ref 35–105)
Anion Gap: 12 (ref 7–16)
Bilirubin,Total: 0.3 mg/dL (ref 0.0–1.2)
CO2: 26 mmol/L (ref 20–28)
Calcium: 9.6 mg/dL (ref 8.6–10.2)
Chloride: 104 mmol/L (ref 96–108)
Creatinine: 1.15 mg/dL — ABNORMAL HIGH (ref 0.51–0.95)
Glucose: 119 mg/dL — ABNORMAL HIGH (ref 60–99)
Lab: 15 mg/dL (ref 6–20)
Potassium: 3.4 mmol/L (ref 3.3–5.1)
Sodium: 142 mmol/L (ref 133–145)
Total Protein: 6.7 g/dL (ref 6.3–7.7)
eGFR BY CREAT: 49 — AB

## 2023-12-11 LAB — MAGNESIUM: Magnesium: 1.8 mg/dL (ref 1.6–2.5)

## 2023-12-11 LAB — DIFF MANUAL: Diff Based On: 116 {cells}

## 2023-12-11 MED ORDER — SODIUM CHLORIDE 0.9 % INJ (FLUSH) WRAPPED *I*
10.0000 mL | Status: DC | PRN
Start: 2023-12-11 — End: 2023-12-11

## 2023-12-11 NOTE — Progress Notes (Signed)
 Patient presents for port draw. IVAD accessed with blood return noted. Standing labs drawn and sent to lab. IVAD saline flushed and de-accessed, site benign. Pt discharged ambulatory, next appointment 11/25 for port draw. AVS declined.

## 2023-12-11 NOTE — Telephone Encounter (Signed)
 Called patient to let her know per Dr. Shlomo she needs to hold Palbociclib  another week due to low WBC and platelets. She states the last time she took Palbociclib  was on 12/06/2023. She will hold medication another week. She will be scheduled for a port draw on 12/18/2023 to check on lab results. Patient verbalizes understanding and is agreeable to plan. Encouraged to call with any questions or concerns.

## 2023-12-14 LAB — INHIBIN B: Inhibin B: 63 pg/mL — ABNORMAL HIGH (ref <=11)

## 2023-12-17 ENCOUNTER — Other Ambulatory Visit: Payer: Self-pay

## 2023-12-18 ENCOUNTER — Ambulatory Visit

## 2023-12-18 ENCOUNTER — Ambulatory Visit
Admission: RE | Admit: 2023-12-18 | Discharge: 2023-12-18 | Disposition: A | Discharge status: Home / Self Care | Source: Ambulatory Visit | Attending: Hematology | Admitting: Hematology

## 2023-12-18 DIAGNOSIS — R911 Solitary pulmonary nodule: Secondary | ICD-10-CM | POA: Insufficient documentation

## 2023-12-18 DIAGNOSIS — C569 Malignant neoplasm of unspecified ovary: Secondary | ICD-10-CM

## 2023-12-18 DIAGNOSIS — J189 Pneumonia, unspecified organism: Secondary | ICD-10-CM | POA: Insufficient documentation

## 2023-12-18 DIAGNOSIS — Z90710 Acquired absence of both cervix and uterus: Secondary | ICD-10-CM | POA: Insufficient documentation

## 2023-12-18 DIAGNOSIS — K769 Liver disease, unspecified: Secondary | ICD-10-CM | POA: Insufficient documentation

## 2023-12-18 DIAGNOSIS — Z9049 Acquired absence of other specified parts of digestive tract: Secondary | ICD-10-CM | POA: Insufficient documentation

## 2023-12-18 DIAGNOSIS — D3911 Neoplasm of uncertain behavior of right ovary: Secondary | ICD-10-CM

## 2023-12-18 DIAGNOSIS — G629 Polyneuropathy, unspecified: Secondary | ICD-10-CM | POA: Insufficient documentation

## 2023-12-18 DIAGNOSIS — C563 Malignant neoplasm of bilateral ovaries: Secondary | ICD-10-CM | POA: Insufficient documentation

## 2023-12-18 LAB — CBC AND DIFFERENTIAL
Baso # K/uL: 0 THOU/uL (ref 0.0–0.2)
Eos # K/uL: 0 THOU/uL (ref 0.0–0.5)
Hematocrit: 33 % — ABNORMAL LOW (ref 34–49)
Hemoglobin: 10.8 g/dL — ABNORMAL LOW (ref 11.2–16.0)
Lymph # K/uL: 1.4 THOU/uL (ref 1.0–5.0)
MCV: 102 fL — ABNORMAL HIGH (ref 75–100)
Mono # K/uL: 0.3 THOU/uL (ref 0.1–1.0)
Neut # K/uL: 0.9 THOU/uL — ABNORMAL LOW (ref 1.5–6.5)
Nucl RBC # K/uL: 0 THOU/uL
Nucl RBC %: 0 /100{WBCs} (ref 0.0–0.2)
Platelets: 207 THOU/uL (ref 150–450)
RBC: 3.2 MIL/uL — ABNORMAL LOW (ref 4.0–5.5)
RDW: 18.6 % — ABNORMAL HIGH (ref 0.0–15.0)
Seg Neut %: 33 %
WBC: 2.6 THOU/uL — ABNORMAL LOW (ref 3.5–11.0)

## 2023-12-18 LAB — DIFF MANUAL: Diff Based On: 112 {cells}

## 2023-12-18 LAB — COMPREHENSIVE METABOLIC PANEL
ALT: 24 U/L (ref 0–35)
AST: 25 U/L (ref 0–35)
Albumin: 3.9 g/dL (ref 3.5–5.2)
Alk Phos: 63 U/L (ref 35–105)
Anion Gap: 13 (ref 7–16)
Bilirubin,Total: 0.3 mg/dL (ref 0.0–1.2)
CO2: 25 mmol/L (ref 20–28)
Calcium: 9.2 mg/dL (ref 8.6–10.2)
Chloride: 105 mmol/L (ref 96–108)
Creatinine: 1.08 mg/dL — ABNORMAL HIGH (ref 0.51–0.95)
Glucose: 130 mg/dL — ABNORMAL HIGH (ref 60–99)
Lab: 13 mg/dL (ref 6–20)
Potassium: 3.5 mmol/L (ref 3.3–5.1)
Sodium: 143 mmol/L (ref 133–145)
Total Protein: 6.4 g/dL (ref 6.3–7.7)
eGFR BY CREAT: 53 — AB

## 2023-12-18 LAB — MAGNESIUM: Magnesium: 1.7 mg/dL (ref 1.6–2.5)

## 2023-12-18 LAB — RBC MORPHOLOGY

## 2023-12-18 LAB — INHIBIN B: Inhibin B: 58 pg/mL — ABNORMAL HIGH (ref <=11)

## 2023-12-18 MED ORDER — STERILE WATER FOR IRRIGATION IR SOLN *I*
900.0000 mL | Freq: Once | Status: AC
Start: 2023-12-18 — End: 2023-12-18
  Administered 2023-12-18: 900 mL via ORAL

## 2023-12-18 MED ORDER — IOHEXOL 350 MG/ML (OMNIPAQUE) IV SOLN *I*
1.0000 mL | Freq: Once | INTRAVENOUS | Status: AC
Start: 2023-12-18 — End: 2023-12-18
  Administered 2023-12-18: 98 mL via INTRAVENOUS

## 2023-12-18 MED ORDER — SODIUM CHLORIDE 0.9 % INJ (FLUSH) WRAPPED *I*
20.0000 mL | Freq: Once | Status: AC
Start: 2023-12-18 — End: 2023-12-18
  Administered 2023-12-18: 20 mL via INTRAVENOUS

## 2023-12-18 NOTE — Progress Notes (Signed)
 Patient presents for port draw. IVAD accessed and blood return noted. Blood samples collected and sent to lab. IVAD saline flushed and deaccessed, site benign. Tolerated well. Next appointment confirmed. AVS declined.

## 2023-12-19 ENCOUNTER — Other Ambulatory Visit: Payer: Self-pay

## 2023-12-19 NOTE — Progress Notes (Unsigned)
 Chief Compliant  Julie Walsh is a 73 y.o. female who presents to the Gynecologic Oncology Clinic at [facility] on 12/20/2023 for a follow-up medical oncology visit.Gynecologic Oncologst: Dr. Josefina. [name] History of Present Illness Julie Walsh is a 73 y.o. female with a history of recurrent granulosa cell ovarian cancer. Initially diagnosed in 03/2010 after presenting with PMB. She is s/p laparoscopic BSO in 2012, and s/p ex-lap, resection of cecum and terminal ileum with primary reanastomosis, appendectomy, TAH, optimal tumor debulking, mobilization of the hepatic flexure and LOH in 2020. She received adjuvant chemotherapy (Carbo/Taxol ) and started maintenance letrozole  in 03/2019-present. She underwent SBRT in 03/2022 for left pelvic lesion, and is now s/p ex-lap with small bowel resection with reanastomosis, peritoneal implants, removal of portion of omentum, LOH with oversewing serotomy, and optimal microscopic cytoreduction on 06/05/23. She then received 3 cycles of adjuvant Carbo/Taxol . She had a syncopal episode with C#1 and was found to have pneumonia, and Taxol  was dose reduced with C#2 due to worsening neuropathy. She had repeat CT scans on after 4 cycles due to slowly declining Inhibin B levels which showed concern for disease progression. She has continued on letrozole . She was transitioned to palbociclib  and completed about 2 weeks of C1 before this was held on 9/18 due to low ANC, this has since been resumed but is now held again due to low ANC. Interval History:She presents today accompanied by her husband for C#3 palbociclib  and daily letrozole . She last took palbociclib  on 12/06/23 and has since been on hold due to low ANC. She reports that this morning, she is experiencing significant balance issues and lightheadedness. This morning when getting out of bed, she felt very dizzy and fell into her bedside table, then fell into her wall and caught herself and was able to  sit down. Denies actually falling or head strike. Shortly after, she was in the shower and developed similar symptoms requiring her to sit down. She does report a history of vertigo but denies sensation of room spinning currently and feels this dizziness is more intense than her typical vertigo symptoms. She developed bilateral lower extremity shakiness and her husband had to help her with all morning activities which is unusual for her. She has not eaten anything today and is currently drinking coffee. She does report a similar feeling last evening while cooking dinner, but not to this degree. Reports difficulty focusing her vision this morning but denies any blurry or double vision. Fatigue had improved for the first week of holding palbociclib , but has been extremely fatigued this past week. Denies any fever, chills, headache, chest pain, dyspnea, abdominal pain, nausea, vomiting, changes in bowel habits, urinary symptoms.    CT C/A/P 12/18/23 reviewed in detail today: No evidence of metastatic disease within the chest.  Stable small pulmonary nodule.  No lymphadenopathy within the chest.  END OF IMPRESSION  History of metastatic ovarian cancer status post hysterectomy, bilateral salpingo-oophorectomy, and small bowel resection.  Mild increased size of the soft tissue nodule adjacent to the ascending colon. Other peritoneal implants are stable.  Stable cystic hepatic lesions.  END OF IMPRESSION Oncologic History Oncology History Overview Note 11/15/2010  - D&C, BSO (Dr. Medford): Right ovary: granulosa cell tumor . Left ovary: benign. Uterus: hyperplasia without atypia. 07/2018 -  ex-lap, resection of cecum and terminal ileum with primary reanastomosis, appendectomy, total abdominal hysterectomy, optimal tumor debulking, mobilization of the hepatic flexure and lysis of adhesions  12 cm granulosa cell tumor, present on serosal surfaces.Had  adjuvant chemotherapy (Carbo/Taxol ) 03/2019 - began Letrozole4/4/23 - Inhibin B: 43. 11/24/21 - MRI Pelvis: 1.3 cm cystic structure in left adnexa, enhancement, nonspecific11/6/23 - CT: 2.5 cm left adnexal lesion, stable. 01/26/22 - PET/CT: 2.5 cm lesion in left adnexa with minimal FDG uptake. 01/20/22 - Inhibin B = 63 (normal is <11)03/22/22--03/28/22 EBRT Left Pelvis 4000 cGy Primary ovarian granulosa cell tumor, right 09/27/2023 Initial Diagnosis  Primary ovarian granulosa cell tumor, right 09/27/2023 -  Chemotherapy  OP Breast Palbociclib  (Ibrance ) and Aromatase InhibitorPlan Provider: Hubert KATHEE Bihari, MD, PhDTreatment goal: PalliativeLine of treatment: Second Line Medical History  PMX, SH, SurgHx, Allergies, Medications Reviewed at todays visit and pertinent changes recorded. Review of Systems See HPIPhysical Exam BP 132/81 (BP Location: Left arm, Patient Position: Sitting, Cuff Size: adult)   Pulse 86   Temp 36.5 C (97.7 F) (Temporal)   Resp 18   Ht 152.4 cm (5')   Wt 66.5 kg (146 lb 9.6 oz)   SpO2 95%   BMI 28.63 kg/m GENERAL: chronically ill-appearing, non-toxic and in no acute distressHEENT: normocephalic, no scleral icterus, mucous membranes moistLUNGS: clear to auscultation bilaterally, no wheezes/rales/rhonchiHEART: regular rate and rhythm, normal S1 and S2, no murmurs/rub/gallopABDOMEN: soft, non-tender, non-distended, normal bowel soundsEXTREMITIES: symmetrical, no pedal edema or calf tendernessSKIN: no visible lesions or rashesNEURO: alert and oriented, PEERL, no facial asymmetry noted, slowed speech but not slurred or incoherent, bilateral upper and lower extremity strength 5/5 in seated position, sensation intact, unable to examine gait as when patient stood up she was very dizzy and examiners had to catch patient from falling backwards.  VS Reviewed.ECOG PERFORMANCE STATUS*Grade ECOG 0 Fully active, able to  carry on all pre-disease performance without restriction 1 Restricted in physically strenuous activity but ambulatory and able to carry out work of a light or sedentary nature, e.g., light house work, office work 2 Ambulatory and capable of all selfcare but unable to carry out any work activities. Up and about more than 50% of waking hours 3 Capable of only limited selfcare, confined to bed or chair more than 50% of waking hours 4 Completely disabled. Cannot carry on any selfcare. Totally confined to bed or chair 5 Dead * As published in Am. J. Clin. Oncol.:Oken, M.M., Leanne, R.H., [place], D.C., Bari, Julie Walsh, P.P.: Toxicity And Response Criteria Of The [facility]. Am JINNY Bridges Nwrno 4:350-344, 1982.Laboratory Data   Lab results: 11/18/251044 WBC 2.6* Hemoglobin 10.8* Hematocrit 33* RBC 3.2* Platelets 207 Neut # K/uL 0.9* Lymph # K/uL 1.4 Mono # K/uL 0.3 Eos # K/uL 0.0 Baso # K/uL 0.0 Seg Neut % 33.0   Lab results: 11/18/251044 Sodium 143 Potassium 3.5 Chloride 105 CO2 25 UN 13 Creatinine 1.08* Glucose 130* Calcium  9.2 Total Protein 6.4 Albumin 3.9 ALT 24 AST 25 Alk Phos 63 Bilirubin,Total 0.3 Pathology **ORIGINAL REPORT DATE: 06/14/2023** A. Small bowel, implant, biopsy:  -Granulosa cell tumor. B. Transverse colon, implant, biopsy:  -Granulosa cell tumor. C. Pelvic, small bowel implant, biopsy:  -Benign adipose tissue with hemorrhage. D. Small bowel implants #2, biopsy:  -Granulosa cell tumor. E. Omentum, partial omentectomy:  -Adipose tissue involved by scattered small foci of granulosa cell tumor. F. Colonic implant, biopsy:  -Fibroadipose tissue involved by scattered small foci of atypical cells, suspicious for granulosa cell tumor. G. Soft tissue, right upper quadrant mass, resection:  -Two benign lymph nodes (0/2).   -Benign adipose tissue. H. Small bowel, mass,  resection:  -Granulosa cell tumor, with extensive intratumoral hemorrhage and cystic formation, predominantly involving small intestinal serosa/subserosa and muscularis propria.  -Background small intestinal mucosa with mild intraepithelial lymphocytosis.  -Resection margins negative for tumor.  -See comment. COMMENT: The patient's reported history of recurrent granulosa cell tumor status post treatment is noted. The tumor cells demonstrate classic morphology featuring Small, bland, cuboidal to polygonal cells with scant cytoplasm and pale, uniform angulated and usually grooved nuclei, growing in microfollicular, pseudopapillary, trabecular and corded patterns, with extensive intratumoral hemorrhage and cystic formation. Immunostains performed on Block H3 show tumor cells to be positive for SF1 and inhibin, focal weakly positive for Calretinin, AE1/AE3, CAM5.2, and EMA, while negative for CK7. The tumor morphology and immunoprofile are compatible with known history of granulosa cell tumor. Intradepartmental consultation obtained. Imaging Data All imaging data was personally reviewed by me and findings are per the HPI.Assessment  Julie Walsh is a 69 with recurrent, metastatic ovarian granulosa cell cancer.  We have discussed the incurable/indolent nature of this disease. We reviewed that any treatment would be given with the intention of helping her live longer and better. We discussed the alternative to treatment, which is best supportive care-- and that given the slow growth rate of this cancer, some people may be able to live years without treatment.  She completed about 2 weeks of C1 palbociclib  + letrozole  before palbociclib  was held on 9/18 due to low ANC. Counts initially improved, so she resumed C2 palbociclib  + letrozole  on 10/16 but ANC dropped again, so palbociclib  dose was reduced to 100 mg (21/28 d). ANC  remains low, so treatment has been held since 12/06/23.CT scans from 12/19/23 reviewed today (in the setting of multiple treatment hold/delays) and are overall stable but do show mild increase in size of soft tissue nodularity adjacent to ascending colon. She has not been able to consistent take the palbociclib  due to cytopenias, so I favor continuing given the minimal change. 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 She has experienced similar symptoms in the past and was seen by neurology and NSGY with concern for posterior circulation brainstem stroke. Given history and acute onset of symptoms, recommend that Julie presents to the ED for further stroke workup. Patient and husband agreeable with plan and patient brought down to ED in a wheelchair by staff member.   Plan Ovarian Granulosa Cell Cancer- Continue to HOLD palbociclib  (21/28 d), can continue daily letrozole . Has not began dose reduced palbociclib  yet due to low ANC - Continue labs weekly for the next month; pending repeat labs next week, hopefully can resume palbociclib  at reduced dose- Inhibin B was 58 on 11/11; level from 11/18 still in process - CTs scans reviewed today as above- Other future options: bevacizumab may be an option (but concerned to use now given risk of falls and concurrent blood thinner use), other hormonal blocking agents, and clinical trials (none currently open)Other Issues- Prior DVT: Eliquis  lifelong- Concern for stroke: transferred to ED as above, will follow work-upI saw and evaluated Julie Walsh Burnard with PA Clive Done. I agree with the assessment and plan as documented above and have edited the note to reflect my own input. I have performed a substantive portion of the visit including developing and discussing the management plan. RTC  in ~1 week or sooner as neededPatient seen and plan discussed with Dr. Juliette Bihari  MD, PHDAssociate Professor of Medicine and OncologyDivision of Gynecologic Oncology This patient encounter meets a high level of medical decision making. I had the opportunity to review, address, and impact one acute or chronic illness (active malignancy) that poses a threat to bodily function. The patient is on active cancer treatment that needs intensive monitoring for toxicity. I had the opportunity do at least 2 of the 3: to review results of unique tests or ordering unique tests or review prior external notes in Epic; and independent interpretation of tests or discussion of management or test interpretation with external physicians/other qualified health care professional/appropriate source.

## 2023-12-20 ENCOUNTER — Other Ambulatory Visit: Payer: Self-pay

## 2023-12-20 ENCOUNTER — Encounter: Payer: Self-pay | Admitting: Hematology

## 2023-12-20 ENCOUNTER — Emergency Department

## 2023-12-20 ENCOUNTER — Ambulatory Visit: Attending: Hematology | Admitting: Hematology

## 2023-12-20 ENCOUNTER — Emergency Department
Admission: EM | Admit: 2023-12-20 | Discharge: 2023-12-20 | Disposition: A | Discharge status: Home / Self Care | Source: Ambulatory Visit | Attending: Student in an Organized Health Care Education/Training Program | Admitting: Student in an Organized Health Care Education/Training Program

## 2023-12-20 VITALS — BP 132/81 | HR 86 | Temp 97.7°F | Resp 18 | Ht 60.0 in | Wt 146.6 lb

## 2023-12-20 DIAGNOSIS — R42 Dizziness and giddiness: Secondary | ICD-10-CM | POA: Insufficient documentation

## 2023-12-20 DIAGNOSIS — C563 Malignant neoplasm of bilateral ovaries: Secondary | ICD-10-CM | POA: Insufficient documentation

## 2023-12-20 DIAGNOSIS — R2681 Unsteadiness on feet: Secondary | ICD-10-CM

## 2023-12-20 DIAGNOSIS — Z7901 Long term (current) use of anticoagulants: Secondary | ICD-10-CM

## 2023-12-20 DIAGNOSIS — Z8543 Personal history of malignant neoplasm of ovary: Secondary | ICD-10-CM

## 2023-12-20 DIAGNOSIS — C569 Malignant neoplasm of unspecified ovary: Secondary | ICD-10-CM

## 2023-12-20 DIAGNOSIS — Z79630 Long term (current) use of alkylating agent: Secondary | ICD-10-CM

## 2023-12-20 DIAGNOSIS — Z8673 Personal history of transient ischemic attack (TIA), and cerebral infarction without residual deficits: Secondary | ICD-10-CM

## 2023-12-20 DIAGNOSIS — Z87891 Personal history of nicotine dependence: Secondary | ICD-10-CM

## 2023-12-20 DIAGNOSIS — J449 Chronic obstructive pulmonary disease, unspecified: Secondary | ICD-10-CM

## 2023-12-20 DIAGNOSIS — R269 Unspecified abnormalities of gait and mobility: Secondary | ICD-10-CM

## 2023-12-20 DIAGNOSIS — I6782 Cerebral ischemia: Secondary | ICD-10-CM

## 2023-12-20 DIAGNOSIS — G9389 Other specified disorders of brain: Secondary | ICD-10-CM

## 2023-12-20 DIAGNOSIS — I726 Aneurysm of vertebral artery: Secondary | ICD-10-CM

## 2023-12-20 DIAGNOSIS — T451X5A Adverse effect of antineoplastic and immunosuppressive drugs, initial encounter: Secondary | ICD-10-CM | POA: Insufficient documentation

## 2023-12-20 DIAGNOSIS — N189 Chronic kidney disease, unspecified: Secondary | ICD-10-CM

## 2023-12-20 DIAGNOSIS — I951 Orthostatic hypotension: Secondary | ICD-10-CM | POA: Insufficient documentation

## 2023-12-20 LAB — RUQ PANEL (ED ONLY)
ALT: 24 U/L (ref 0–35)
AST: 27 U/L (ref 0–35)
Albumin: 4 g/dL (ref 3.5–5.2)
Alk Phos: 61 U/L (ref 35–105)
Amylase: 45 U/L (ref 28–100)
Bilirubin,Direct: <0.2 mg/dL (ref 0.0–0.3)
Bilirubin,Total: 0.3 mg/dL (ref 0.0–1.2)
Lipase: 33 U/L (ref 13–60)
Total Protein: 6.4 g/dL (ref 6.3–7.7)

## 2023-12-20 LAB — CBC AND DIFFERENTIAL
Baso # K/uL: 0.1 THOU/uL (ref 0.0–0.2)
Eos # K/uL: 0 THOU/uL (ref 0.0–0.5)
Hematocrit: 33 % — ABNORMAL LOW (ref 34–49)
Hemoglobin: 10.8 g/dL — ABNORMAL LOW (ref 11.2–16.0)
IMM Granulocytes #: 0 THOU/uL (ref 0–0)
IMM Granulocytes: 0.3 %
Lymph # K/uL: 1.2 THOU/uL (ref 1.0–5.0)
MCV: 103 fL — ABNORMAL HIGH (ref 75–100)
Mono # K/uL: 0.5 THOU/uL (ref 0.1–1.0)
Neut # K/uL: 1.4 THOU/uL — ABNORMAL LOW (ref 1.5–6.5)
Nucl RBC # K/uL: 0 THOU/uL
Nucl RBC %: 0 /100{WBCs} (ref 0.0–0.2)
Platelets: 237 THOU/uL (ref 150–450)
RBC: 3.2 MIL/uL — ABNORMAL LOW (ref 4.0–5.5)
RDW: 18.2 % — ABNORMAL HIGH (ref 0.0–15.0)
Seg Neut %: 45.4 %
WBC: 3.2 THOU/uL — ABNORMAL LOW (ref 3.5–11.0)

## 2023-12-20 LAB — BASIC METABOLIC PANEL
Anion Gap: 12 (ref 7–16)
CO2: 25 mmol/L (ref 20–28)
Calcium: 9.1 mg/dL (ref 8.6–10.2)
Chloride: 103 mmol/L (ref 96–108)
Creatinine: 1.04 mg/dL — ABNORMAL HIGH (ref 0.51–0.95)
Glucose: 138 mg/dL — ABNORMAL HIGH (ref 60–99)
Lab: 13 mg/dL (ref 6–20)
Potassium: 3.5 mmol/L (ref 3.3–5.1)
Sodium: 140 mmol/L (ref 133–145)
eGFR BY CREAT: 56 — AB

## 2023-12-20 MED ORDER — HEPARIN LOCK FLUSH 10 UNIT/ML IJ SOLN WRAPPED *I*
50.0000 [IU] | Freq: Three times a day (TID) | INTRAVENOUS | Status: DC | PRN
Start: 2023-12-20 — End: 2023-12-20
  Administered 2023-12-20: 50 [IU]
  Filled 2023-12-20: qty 5

## 2023-12-20 MED ORDER — DEXTROSE 5 % FLUSH FOR PUMPS *I*
0.0000 mL/h | INTRAVENOUS | Status: DC | PRN
Start: 2023-12-20 — End: 2023-12-20

## 2023-12-20 MED ORDER — GADOPICLENOL 0.5 MMOL/ML (VUEWAY) IV SOLN *I*
7.0000 mL | Freq: Once | INTRAVENOUS | Status: AC
Start: 2023-12-20 — End: 2023-12-20
  Administered 2023-12-20: 7 mL via INTRAVENOUS

## 2023-12-20 MED ORDER — SODIUM CHLORIDE 0.9 % INJ (FLUSH) WRAPPED *I*
10.0000 mL | Status: DC | PRN
Start: 2023-12-20 — End: 2023-12-20

## 2023-12-20 MED ORDER — SODIUM CHLORIDE 0.9 % FLUSH FOR PUMPS *I*
0.0000 mL/h | INTRAVENOUS | Status: DC | PRN
Start: 2023-12-20 — End: 2023-12-20

## 2023-12-20 MED ORDER — SODIUM CHLORIDE 0.9 % IV BOLUS *I*
1000.0000 mL | Freq: Once | Status: AC
Start: 2023-12-20 — End: 2023-12-20
  Administered 2023-12-20: 1000 mL via INTRAVENOUS

## 2023-12-20 MED ORDER — IOHEXOL 350 MG/ML (OMNIPAQUE) IV SOLN *I*
70.0000 mL | Freq: Once | INTRAVENOUS | Status: AC
Start: 2023-12-20 — End: 2023-12-20
  Administered 2023-12-20: 70 mL via INTRAVENOUS

## 2023-12-20 NOTE — ED Notes (Signed)
 12/20/23 1013 Expected Patient Date of notification 12/20/23 Time Notified 1013 Notified by Office/PCP ED Service Adult Call-in Pt Info note/Reason for sending hx ovarian cancer, concerned for stroke, balance issues, difficulty finding words, hx posterior circular stroke Patient coming from MD Office Office name oncology Expected Call-In Information Report called to Rihaan Barrack E.

## 2023-12-20 NOTE — Provider Consult (Incomplete)
 Neurology Stroke Consult Note Referring Provider/Service:   Reason for Consult:    Stroke Documentation Last Known Well (LKW):    Discovery of Symptoms (DOS):    Stroke Team Activated (paged):   Stroke Team Arrived:   History of Present Illness (HPI) Julie Walsh is a 73 y.o. 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  Today got up and caught herself when falling down.  Same thing happened when she stepped over.  Got dressedOff balance, vertigo. Had them when she started to get chemo.  Goes to do something, only good for 15-20 minutes.  Feels lightheaded, can't focus. Gets blurry vision like her eyes can't catch up. Only when you go to do something. Difficulty going from sitting to standing loses her balance.  Feels like she is going to faint.  When side ways and went forward and grabbed the wall.  Waited. Less severe prolonged cousre 1-2 weeks.  Having to use grab bars. Blurred vision looking to the right, discojugate gazeSit down and doesn't do a lot.  Would show up again when she was doing things. Feels like the spells are worse.  Sometimes tunnel vision when it helps. Sits a lot during the day and watches TV, when she walks around.  Almost every other day._____ She reports that this morning, she is experiencing significant balance issues and lightheadedness. This morning when getting out of bed, she felt very dizzy and fell into her bedside table, then fell into her wall and caught herself and was able to sit down. Denies actually falling or head strike. Shortly after, she was in the shower and  developed similar symptoms requiring her to sit down. She does report a history of vertigo but denies sensation of room spinning currently and feels this dizziness is more intense than her typical vertigo symptoms. She developed bilateral lower extremity shakiness and her husband had to help her with all morning activities which is unusual for her. She has not eaten anything today and is currently drinking coffee. She does report a similar feeling last evening while cooking dinner, but not to this degree. Reports difficulty focusing her vision this morning but denies any blurry or double vision.  Previously seen by Dr. Burnard, on 12/20/2022.  She presented to [facility] late October 2024 with the acute onset of dizziness and gait ataxia.  She states that she had difficulty supporting herself when she tried to get out of bed and had difficulty walking to her kitchen.  She endorsed sensations of feeling like she might blackout but also described intense motion sensitivity, which did not result in vertigo per se but more an intense sensation of nausea. She was found to have some nystagmus on evaluation in the emergency department. MRI brain without contrast was negative for any signs of acute ischemic stroke, though her symptoms were felt to possibly represent a small MRI negative brainstem stroke given the acute onset. She feels that some of her symptoms, specifically her sensation of balance dysfunction, has improved though does remain far from baseline. More worrisome to her is that she is beginning to notice more specific problems on her right side. She feels that her right leg has not been improving and may be  slightly weaker. She has also been noticing some binocular horizontal double vision, particularly when looking to her right. The patient is currently on .Pre-stroke modified rankin scale:  Additional History Patient's past medical, surgical, social, and family history was reviewed.   Patient's allergies were reviewed.  Please see eRecord for full details.Home Medications Medications Ordered Prior to Encounter[1]Physical Examination Temp:  [36.1 C (97 F)-36.5 C (97.7 F)] 36.1 C (97 F)Heart Rate:  [73-86] 73Resp:  [18] 18BP: (108-132)/(81-84) 108/84General:  No acute distressNeurological Examination ***Mental Status:  Awake and alert; oriented to age and month; fluent; naming, repetition, and comprehension intact; no evidence of neglect or apraxia; normal affectCranial Nerves:II (R/L):  Pupils constrict from 3/3 to 2/2 to light; visual fields were full to confrontationIII/IV/VI:  Versions were intact without nystagmus; no gaze preference; no ptosisV:  Facial sensation was symmetric to light touchVII:  Facial expression was symmetricVIII:  Hearing was intact to voiceIX/X:  Palate elevated symmetrically; no evidence of dysarthria or dysphoniaXI:  Shoulder shrug appeared symmetricXII:  Tongue protruded midlineMotor:  Muscle bulk, tone, and strength is normal and symmetric throughout; pronator drift was absent; no evidence of abnormal movementsSensory:  Intact light touch throughout; no evidence of tactile extinction to DSSCoordination:  Finger to nose and heel to shin testing were intact bilaterally; no evidence of ataxia or dysmetriaReflexes (R/L):  DeferredGait:  DeferredNIH Stroke Scale NIHSS Performed: NIH Stroke Scale: Labs/Imaging I personally reviewed the patient's following laboratory data and radiology studies.Recent Labs Lab 11/20/251156 11/18/251044 WBC 3.2* 2.6* RBC 3.2* 3.2* Hemoglobin 10.8* 10.8* Hematocrit 33* 33* Platelets 237 207 Recent Labs Lab 11/18/251044 Sodium 143 Potassium 3.5 Chloride 105 CO2 25 UN 13 Creatinine 1.08* Glucose 130* No results for input(s): INR in the last 168 hours.No results for input(s): LDL, LDLC, FLLDL, HA1C in the  last 168 hours.Brain and vessel imaging:CT head:  {CT Head:21014647::no early ischemic change,no hemorrhage,no midline shift:0}CTA head:  {CTA Head:21014649::no large vessel stenosis,no large vessel occlusion:0}CTA neck:  {CTA Neck:21014651::no cervical large vessel stenosis or occlusion:0}IV Thrombolytic Decision Making I discussed acute treatment decisions with attending/fellow Dr.   who personally reviewed the CT head scan on *** Please refresh the note before signing at .Was IV thrombolytic administered?:                     Assessment 73 y.o. female with *** presenting with ***.Patient's neurological exam is notable for *** with NIHSS of  .  CT head showed ***.  CTA head and neck showed ***.The patient's clinical presentation is ***.  Alternative explanations considered include ***.The patient  administered IV thrombolytic therapy at this hospital.Dysphagia Screen performed?      ,   Plan {Plan:37318}- If new neurological symptoms occur, obtain STAT CT head without contrast and page stroke neurology resident on-callPatient {Discussed/seen and examined:60633} with neurology attending, Dr.  Dino:  Vernell Punch, MD 12/20/2023 as of 12:13 PM [1] No current facility-administered medications on file prior to encounter. Current Outpatient Medications on File Prior to Encounter Medication Sig Dispense Refill  palbociclib  (IBRANCE ) 100 MG tablet Take 1 tablet (100 mg total) by mouth daily. Swallow whole. Take with or without food on days 1-21 of a 28 day cycle. 30 tablet 3  MACROBID 100 MG capsule Take 1 capsule every 12 hours by oral route for 7 days.    letrozole  (FEMARA ) 2.5 mg tablet Take 1 tablet (2.5 mg total) by mouth daily. 30 tablet 5  palbociclib  (IBRANCE ) 125 MG tablet Take 1 tablet (125 mg  total) by mouth daily. Swallow whole. Take with or without food on days 1-21 of a 28 day cycle. 21 tablet 5   FLUoxetine 10 mg capsule Take 1 capsule (10 mg total) by mouth daily.    multivitamin with minerals-trace elements (CENTRUM SILVER) TABS tablet Take 1 tablet by mouth daily.    loratadine  (CLARITIN ) 10 mg tablet Take 1 tablet (10 mg total) by mouth daily for Bone pain related to chemotherapy. 90 tablet 1  senna (SENOKOT) 8.6 mg tablet Take 2 tablets by mouth nightly. 100 tablet 0  generic DME Dispense: Shower ChairIndication: Unsteady Gait ICD-10: R26.81Duration: Lifetime Ht Readings from Last 1 Encounters:11/29/22 : 1.499 m (4' 11) Wt Readings from Last 1 Encounters:11/29/22 : 66.7 kg (147 lb) 1 each 0  atorvastatin  (LIPITOR) 40 mg tablet Take 1 tablet (40 mg total) by mouth daily. 90 tablet 0  clotrimazole-betamethasone (LOTRISONE) cream Apply topically 2 times daily. For back    ondansetron  (ZOFRAN ) 4 mg tablet Take 1 tablet (4 mg total) by mouth every 8 hours as needed.    triamcinolone (KENALOG) 0.1 % cream Apply topically daily.    famotidine  20 mg tablet Take 1 tablet (20 mg total) by mouth nightly.    tretinoin (RETIN-A) 0.05 % cream SMARTSIG:Topical Every Night    GABAPENTIN  100 MG capsule Take 2 capsules (200 mg total) by mouth daily. Takes 200 mg in the AM and 300 mg in the PM    acetaminophen  650 mg CR tablet Take by mouth    apixaban  (ELIQUIS ) 5 mg tablet Take 1 tablet (5 mg total) by mouth every 12 hours.    cyclobenzaprine (FLEXERIL) 5 mg tablet Take by mouth    hydrocortisone (ANUSOL-HC) 2.5 % rectal cream     Multivitamin tablet Take 1 tablet by mouth daily.    pantoprazole  (PROTONIX ) 40 mg EC tablet     ALPRAZolam (NIRAVAM) 0.25 MG dissolvable tablet Take 1 tablet (0.25 mg total) by mouth nightly as needed for Anxiety.    nystatin (MYCOSTATIN) 100000 UNIT/GM cream Apply topically 2 times daily  to the following areas:    gabapentin  (NEURONTIN ) 300 MG capsule Take 1 capsule (300 mg total) by mouth nightly. Takes 200 mg in the AM and 300 mg in  the PM    traZODone  (DESYREL ) 100 MG tablet Take 1 tablet (100 mg total) by mouth nightly.    Cholecalciferol (VITAMIN D3) 1000 UNIT tablet Take 1 tablet (1,000 units total) by mouth daily.

## 2023-12-20 NOTE — ED Triage Notes (Signed)
 States she was seen at Oncology today and is having some balance issues that has been going on for months and worsened this morning. Denies any falls. States she feels shaky and weak in both of her legs. Not currently under treatment.
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
## 2023-12-20 NOTE — Discharge Instructions (Addendum)
 You were seen in the emergency room today for dizziness.  We think this is most likely due to dehydration causing dizziness upon standing.  Try to increase your fluid intake.  Follow-up with your oncology team in the next week to reassess symptoms.  Return to the hospital if you are feeling worsened lightheadedness or dizziness, feeling like you are too weak to walk or going to collapse, or any additional new concerns.  You may return for reevaluation at any time.

## 2023-12-20 NOTE — Provider Consult (Signed)
 General Neurology Consult NoteConsult question: DizzinessRequested by: ED History of Present Illness: Julie Walsh is a 73 y.o. 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  The dizziness is described specifically as lightheaded and the feeling as if she is going to pass out.  These episodes only occur when she is going from sitting to standing or when she is exerting herself.  These episodes never occur when she is laying down flat in bed or sitting in a chair.  When she starts to feel very lightheaded and like she is going to fall, she will be unsteady on her feet and tried to grab at the wall, or grab bar, or something close by to steady herself.  She will then get to the ground, and her symptoms will gradually improve within 20 minutes.  Then, she will be able to get up and move around and go back to doing her previous activities.She reports that maybe in the last 2 weeks, the episodes are a little bit more frequent, maybe occurring every other day.  This morning on 11/20, after she woke up she went to get out of bed and felt very dizzy and fell into her bedside table and caught herself on the wall and was able to get to the ground.  She then felt that she was unsteady on her feet in the morning, and she required assistance getting dressed which is unusual for her.  Went to her oncology appointment today, who sent her into the emergency department for further testing.Orthostatic vitals completed in the [facility]:Ojbpwh down blood pressure 142/80, heart rate 74Standing up blood pressure 100/60, heart rate 90Previously seen by Dr. Sor, neurology, on  12/20/2022:  She presented to [facility] late October 2024 with the acute onset of dizziness and gait ataxia.  She states that she had difficulty supporting herself when she tried to get out of bed and had difficulty walking to her kitchen.  She endorsed sensations of feeling like she might blackout but also described intense motion sensitivity, which did not result in vertigo per se but more an intense sensation of nausea. She was found to have some nystagmus on evaluation in the emergency department. MRI brain without contrast was negative for any signs of acute ischemic stroke, though her symptoms were felt to possibly represent a small MRI negative brainstem stroke given the acute onset. She feels that some of her symptoms, specifically her sensation of balance dysfunction, has improved though does remain far from baseline. More worrisome to her is that she is beginning to notice more specific problems on her right side. She feels that her right leg has not been improving and may be slightly weaker. She has also been noticing some binocular horizontal double vision, particularly when looking to her right.  Past History: Patient's past medical, surgical, social, and family history was reviewed.  Patient's allergies were reviewed.  Please see eRecord for full details.Objective: BP: (108-132)/(81-84) Temp:  [36.1 C (97 F)-36.5 C (97.7 F)] Temp src: Temporal (11/20 1023)Heart Rate:  [73-86] Resp:  [18] SpO2:  [94 %-95 %] Height:  [152.4 cm (5')] Weight:  [66.2 kg (146 lb)-66.5 kg (146 lb 9.6 oz)] Body mass index is 28.51 kg/m. General: Awake, NADCardiac: Regular rate  and rhythmPulmonary: normal WOBContinuous infusions:Neurological Examination: Mental Status: Awake and alert. Fluent. Comprehension intact. Affect appropriate.Cranial Nerves:     II: Pupils 3/3 to 2/2, fields intact to confrontation.   III/IV/VI: Versions intact without nystagmus, no gaze  preference.  V: Facial sensation symmetric to light touch  VII: Facial expression symmetric  VIII: Hearing intact to voice  IX/X: Palate elevates symmetrically  XI: Shoulder shrug symmetric  XII: Tongue midlineMotor: Bulk diminished, tone normal and no rigidity with distraction. Pronator drift was absent. There was a large amplitude tremor in the left arm. Strength to confrontation 5/5 in all extremities except for 4+/5 in left arm extension. Sensory: Sensation to light touch intact.Coordination: Finger to nose was intact.Gait: When standing, patient very unsteady and after a few minutes, was starting to feel lightheaded.Laboratory Data:- Generalized encephalopathy review:   Lab results: 11/20/251156 11/18/251044 Sodium 140 143 Glucose 138* 130* CO2 25 25 WBC 3.2* 2.6* RBC 3.2* 3.2* Hemoglobin 10.8* 10.8* Hematocrit 33* 33* MCV 103* 102* Platelets 237 207 Neut # K/uL 1.4* 0.9* Lymph # K/uL 1.2 1.4 Mono # K/uL 0.5 0.3 Eos # K/uL 0.0 0.0 Baso # K/uL 0.1 0.0 Nucl RBC # K/uL 0.0 0.0 AST 27 25 ALT 24 24 Bilirubin,Total 0.3 0.3 Albumin 4.0 3.9 Calcium  9.1 9.2 Magnesium   --  1.7 UN 13 13 Creatinine 1.04* 1.08* Imaging and additional studies:CT chest with IV contrastResult Date: 11/19/2025No evidence of metastatic disease within the chest. Stable small pulmonary nodule. No lymphadenopathy within the chest. END OF IMPRESSION CT abdomen and pelvis with contrastResult Date: 11/19/2025History of metastatic ovarian cancer status post hysterectomy, bilateral salpingo-oophorectomy, and small bowel resection. Mild increased size of the soft tissue nodule adjacent to the ascending colon. Other peritoneal implants are stable. Stable cystic hepatic lesions. END OF IMPRESSION MR head w/wo contrast: 1. Mild small vessel ischemic similar to the prior study.2. Sequela of right trigeminal nerve decompression surgery is suboptimally  visualized on the current study, as this examination was not tailored for the trigeminal nerves, but grossly stable.Assessment: LYRIK DOCKSTADER is a 73 y.o. 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  Patient describes the dizziness is primarily being lightheadedness.  She only feels this lightheadedness when going from sitting to standing, or when she is exerting herself, and never feels this dizziness or unsteadiness when sitting down.  Given the description of the events, along with the profoundly positive orthostatic vitals noted on exam, patient's presentation is likely related to orthostatic hypotension.  Reassuringly, MRI head with and without contrast is without infarct with no abnormal enhancement of the meninges.  Would recommend trialing fluids, and encouraging patient to increase fluid intake and fluid intake at home.Plan: Orthostatic hypotension- IV fluids per ED- Encourage oral intake at home- Can consider compression stockings and/or salt tablets outpatientPatient seen and examined with Dr. Morene Bare, neurology attendingRachel Reeta, MD Neurology PGY-2 12/20/2023 1:59 PM

## 2023-12-22 LAB — INHIBIN B: Inhibin B: 60 pg/mL — ABNORMAL HIGH (ref <=11)

## 2023-12-24 ENCOUNTER — Other Ambulatory Visit: Payer: Self-pay

## 2023-12-24 NOTE — Progress Notes (Addendum)
 Chief Compliant  Julie Walsh is a 73 y.o. female who presents to the Gynecologic Oncology Clinic at [facility] on 12/24/2023 for a follow-up medical oncology visit.Gynecologic Oncologst: Dr. Josefina. [name] History of Present Illness Julie Walsh is a 73 y.o. female with a history of recurrent granulosa cell ovarian cancer. Initially diagnosed in 03/2010 after presenting with PMB. She is s/p laparoscopic BSO in 2012, and s/p ex-lap, resection of cecum and terminal ileum with primary reanastomosis, appendectomy, TAH, optimal tumor debulking, mobilization of the hepatic flexure and LOH in 2020. She received adjuvant chemotherapy (Carbo/Taxol ) and started maintenance letrozole  in 03/2019-present. She underwent SBRT in 03/2022 for left pelvic lesion, and is now s/p ex-lap with small bowel resection with reanastomosis, peritoneal implants, removal of portion of omentum, LOH with oversewing serotomy, and optimal microscopic cytoreduction on 06/05/23. She then received 3 cycles of adjuvant Carbo/Taxol . She had a syncopal episode with C#1 and was found to have pneumonia, and Taxol  was dose reduced with C#2 due to worsening neuropathy. She had repeat CT scans on after 4 cycles due to slowly declining Inhibin B levels which showed concern for disease progression. She has continued on letrozole . She was transitioned to palbociclib  and completed about 2 weeks of C1 before this was held on 9/18 due to low ANC, this has since been resumed but is now held again due to low ANC. Interval History:Julie Walsh presents today for follow-up on palbociclib  and letrozole . During her last visit, she was found to have sudden issues with balance and lightheadedness. She was further evaluated in the [facility] ED. CT angio neck and MR head without acute findings. Neurology was consulted and recommended supportive care for likely orthostasis.Of note, palbociclib  has been on hold since 12/06/23 due to neutropenia.Since her last  visit, she reports she hasn't had any further issues with balance/lightheadedness. She reports she was in [place] this past weekend and was able to walk around the city without difficulty. Reports her neuropathy feels improved. Her only other sxs is some left hand swelling that she woke-up with this morning. Denies N/V, chest pain, SOB, fevers, chills, confusion, and/or urinary sxs.Oncologic History Oncology History Overview Note 11/15/2010  - D&C, BSO (Dr. Medford): Right ovary: granulosa cell tumor . Left ovary: benign. Uterus: hyperplasia without atypia. 07/2018 -  ex-lap, resection of cecum and terminal ileum with primary reanastomosis, appendectomy, total abdominal hysterectomy, optimal tumor debulking, mobilization of the hepatic flexure and lysis of adhesions             12 cm granulosa cell tumor, present on serosal surfaces.Had adjuvant chemotherapy (Carbo/Taxol ) 03/2019 - began Letrozole4/4/23 - Inhibin B: 43. 11/24/21 - MRI Pelvis: 1.3 cm cystic structure in left adnexa, enhancement, nonspecific11/6/23 - CT: 2.5 cm left adnexal lesion, stable. 01/26/22 - PET/CT: 2.5 cm lesion in left adnexa with minimal FDG uptake. 01/20/22 - Inhibin B = 63 (normal is <11)03/22/22--03/28/22 EBRT Left Pelvis 4000 cGy Primary ovarian granulosa cell tumor, right 09/27/2023 Initial Diagnosis  Primary ovarian granulosa cell tumor, right 09/27/2023 -  Chemotherapy  OP Breast Palbociclib  (Ibrance ) and Aromatase InhibitorPlan Provider: Hubert KATHEE Bihari, MD, PhDTreatment goal: PalliativeLine of treatment: Second Line Medical History  PMX, SH, SurgHx, Allergies, Medications Reviewed at todays visit and pertinent changes recorded. Review of Systems See HPIPhysical Exam There were no vitals taken for this visit.GENERAL: chronically ill-appearing, non-toxic and in no acute distressHEENT: normocephalic, no scleral icterus, mucous membranes  moistLUNGS: clear to auscultation bilaterally, no wheezes/rales/rhonchiHEART: regular rate and rhythm, normal S1 and S2, no murmurs/rub/gallopABDOMEN:  soft, non-tender, non-distended, normal bowel soundsEXTREMITIES: symmetrical, no pedal edema or calf tendernessSKIN: no visible lesions or rashesNEURO: alert and oriented, PEERL, no facial asymmetry noted, normal speech, bilateral upper and lower extremity strength 5/5 in seated position, normal gateECOG PERFORMANCE STATUS*Grade ECOG 0 Fully active, able to carry on all pre-disease performance without restriction 1 Restricted in physically strenuous activity but ambulatory and able to carry out work of a light or sedentary nature, e.g., light house work, office work 2 Ambulatory and capable of all selfcare but unable to carry out any work activities. Up and about more than 50% of waking hours 3 Capable of only limited selfcare, confined to bed or chair more than 50% of waking hours 4 Completely disabled. Cannot carry on any selfcare. Totally confined to bed or chair 5 Dead * As published in Am. J. Clin. Oncol.:Oken, M.M., Leanne, R.H., [place], D.C., Bari, DOROTHA Nicholaus HOOVER Karie DONNA Polo, P.P.: Toxicity And Response Criteria Of [facility]. Am JINNY Bridges [place] 4:350-344, 1982.Laboratory Data   Lab results: 11/20/251156 WBC 3.2* Hemoglobin 10.8* Hematocrit 33* RBC 3.2* Platelets 237 Neut # K/uL 1.4* Lymph # K/uL 1.2 Mono # K/uL 0.5 Eos # K/uL 0.0 Baso # K/uL 0.1 Seg Neut % 45.4   Lab results: 11/20/251156 Sodium 140 Potassium 3.5 Chloride 103 CO2 25 UN 13 Creatinine 1.04* Glucose 138* Calcium  9.1 Total Protein 6.4 Albumin 4.0 ALT 24 AST 27 Alk Phos 61 Bilirubin,Total 0.3 Pathology **ORIGINAL REPORT DATE: 06/14/2023** A. Small bowel, implant, biopsy:  -Granulosa cell tumor. B. Transverse colon, implant, biopsy:   -Granulosa cell tumor. C. Pelvic, small bowel implant, biopsy:  -Benign adipose tissue with hemorrhage. D. Small bowel implants #2, biopsy:  -Granulosa cell tumor. E. Omentum, partial omentectomy:  -Adipose tissue involved by scattered small foci of granulosa cell tumor. F. Colonic implant, biopsy:  -Fibroadipose tissue involved by scattered small foci of atypical cells, suspicious for granulosa cell tumor. G. Soft tissue, right upper quadrant mass, resection:  -Two benign lymph nodes (0/2).  -Benign adipose tissue. H. Small bowel, mass, resection:  -Granulosa cell tumor, with extensive intratumoral hemorrhage and cystic formation, predominantly involving small intestinal serosa/subserosa and muscularis propria.  -Background small intestinal mucosa with mild intraepithelial lymphocytosis.  -Resection margins negative for tumor.  -See comment. COMMENT: The patient's reported history of recurrent granulosa cell tumor status post treatment is noted. The tumor cells demonstrate classic morphology featuring Small, bland, cuboidal to polygonal cells with scant cytoplasm and pale, uniform angulated and usually grooved nuclei, growing in microfollicular, pseudopapillary, trabecular and corded patterns, with extensive intratumoral hemorrhage and cystic formation. Immunostains performed on Block H3 show tumor cells to be positive for SF1 and inhibin, focal weakly positive for Calretinin, AE1/AE3, CAM5.2, and EMA, while negative for CK7. The tumor morphology and immunoprofile are compatible with known history of granulosa cell tumor. Intradepartmental consultation obtained. Imaging Data All imaging data was personally reviewed by me and findings are per the HPI.Assessment  Julie Walsh is a 87 with recurrent, metastatic ovarian granulosa cell cancer.  We have discussed the incurable/indolent nature of this disease. We reviewed that any  treatment would be given with the intention of helping her live longer and better. We discussed the alternative to treatment, which is best supportive care-- and that given the slow growth rate of this cancer, some people may be able to live years without treatment.  She completed about 2 weeks of C1 palbociclib  + letrozole  before palbociclib  was held on 9/18 due to low  ANC. Counts initially improved, so she resumed C2 palbociclib  + letrozole  on 10/16 but ANC dropped again, so palbociclib  dose was reduced to 100 mg (21/28 d). ANC remains low, so treatment has been held since 12/06/23.CT scans from last visit show overall stability, but there is a mild increase in size of soft tissue nodularity beside the ascending colon. Will plan for continuation of letrozole  and palbociclib  at reduced dose. Plan - RESTART palbociclib  (at reduced dose) - 3 weeks on, 1 week off, pending labs.- Plan for weekly labs at new reduced dose - Continue letrozole  2.5 mg daily. - Labs reviewed - Continue to trend inhibins.- History of DVT: continue Eliquis  indefinitely- Other future options: bevacizumab may be an option (but concerned to use now given risk of falls and concurrent blood thinner use), other hormonal blocking agents, and clinical trials (none currently open)RTC 4 weeksPatient seen and plan developed with Dr. Shlomo.Rosaline Whittum MDGyn Onc Fellow PGY-6I saw and evaluated Julie Walsh with Dr. Nidia. I agree with the assessment and plan as documented above and have edited the note to reflect my own input. I have performed a substantive portion of the visit including developing and discussing the management plan. Hubert Shlomo MD, PHDAssociate Professor of Medicine and OncologyDivision of Gynecologic Oncology This patient encounter meets a high level of medical decision making. I had the opportunity to review, address, and impact one acute or chronic illness (active  malignancy) that poses a threat to bodily function. The patient is on active cancer treatment that needs intensive monitoring for toxicity. I had the opportunity do at least 2 of the 3: to review results of unique tests or ordering unique tests or review prior external notes in Epic; and independent interpretation of tests or discussion of management or test interpretation with external physicians/other qualified health care professional/appropriate source.

## 2023-12-25 ENCOUNTER — Ambulatory Visit: Attending: Hematology

## 2023-12-25 VITALS — BP 142/70 | HR 84 | Temp 98.1°F | Resp 16

## 2023-12-25 DIAGNOSIS — D3911 Neoplasm of uncertain behavior of right ovary: Secondary | ICD-10-CM | POA: Insufficient documentation

## 2023-12-25 LAB — COMPREHENSIVE METABOLIC PANEL
ALT: 26 U/L (ref 0–35)
AST: 29 U/L (ref 0–35)
Albumin: 4.1 g/dL (ref 3.5–5.2)
Alk Phos: 65 U/L (ref 35–105)
Anion Gap: 12 (ref 7–16)
Bilirubin,Total: 0.4 mg/dL (ref 0.0–1.2)
CO2: 24 mmol/L (ref 20–28)
Calcium: 9.4 mg/dL (ref 8.6–10.2)
Chloride: 105 mmol/L (ref 96–108)
Creatinine: 1.16 mg/dL — ABNORMAL HIGH (ref 0.51–0.95)
Glucose: 117 mg/dL — ABNORMAL HIGH (ref 60–99)
Lab: 10 mg/dL (ref 6–20)
Potassium: 3.3 mmol/L (ref 3.3–5.1)
Sodium: 141 mmol/L (ref 133–145)
Total Protein: 6.6 g/dL (ref 6.3–7.7)
eGFR BY CREAT: 49 — AB

## 2023-12-25 LAB — CBC AND DIFFERENTIAL
Baso # K/uL: 0.1 THOU/uL (ref 0.0–0.2)
Eos # K/uL: 0.1 THOU/uL (ref 0.0–0.5)
Hematocrit: 37 % (ref 34–49)
Hemoglobin: 11.7 g/dL (ref 11.2–16.0)
IMM Granulocytes #: 0 THOU/uL (ref 0–0)
IMM Granulocytes: 0.3 %
Lymph # K/uL: 1.5 THOU/uL (ref 1.0–5.0)
MCV: 103 fL — ABNORMAL HIGH (ref 75–100)
Mono # K/uL: 0.5 THOU/uL (ref 0.1–1.0)
Neut # K/uL: 1.7 THOU/uL (ref 1.5–6.5)
Nucl RBC # K/uL: 0 THOU/uL
Nucl RBC %: 0 /100{WBCs} (ref 0.0–0.2)
Platelets: 271 THOU/uL (ref 150–450)
RBC: 3.6 MIL/uL — ABNORMAL LOW (ref 4.0–5.5)
RDW: 17.1 % — ABNORMAL HIGH (ref 0.0–15.0)
Seg Neut %: 43.3 %
WBC: 3.8 THOU/uL (ref 3.5–11.0)

## 2023-12-25 LAB — MAGNESIUM: Magnesium: 1.8 mg/dL (ref 1.6–2.5)

## 2023-12-25 MED ORDER — SODIUM CHLORIDE 0.9 % INJ (FLUSH) WRAPPED *I*
10.0000 mL | Status: DC | PRN
Start: 2023-12-25 — End: 2023-12-25

## 2023-12-25 NOTE — Progress Notes (Signed)
 Patient presents for port draw. IVAD accessed with blood return noted. Standing labs drawn and sent to lab. IVAD saline flushed and de-accessed, site benign. Pt discharged ambulatory, retuning in a week (12/2). AVS declined.

## 2023-12-30 ENCOUNTER — Other Ambulatory Visit: Payer: Self-pay

## 2023-12-30 LAB — INHIBIN B: Inhibin B: 67 pg/mL — ABNORMAL HIGH (ref <=11)

## 2023-12-31 ENCOUNTER — Other Ambulatory Visit: Payer: Self-pay

## 2023-12-31 ENCOUNTER — Ambulatory Visit: Attending: Hematology | Admitting: Hematology

## 2023-12-31 VITALS — BP 132/72 | HR 81 | Temp 97.5°F | Resp 18 | Ht 60.0 in | Wt 153.0 lb

## 2023-12-31 DIAGNOSIS — I10 Essential (primary) hypertension: Secondary | ICD-10-CM | POA: Insufficient documentation

## 2023-12-31 DIAGNOSIS — R42 Dizziness and giddiness: Secondary | ICD-10-CM | POA: Insufficient documentation

## 2023-12-31 DIAGNOSIS — C563 Malignant neoplasm of bilateral ovaries: Secondary | ICD-10-CM | POA: Insufficient documentation

## 2023-12-31 DIAGNOSIS — Z09 Encounter for follow-up examination after completed treatment for conditions other than malignant neoplasm: Secondary | ICD-10-CM | POA: Insufficient documentation

## 2023-12-31 DIAGNOSIS — D709 Neutropenia, unspecified: Secondary | ICD-10-CM | POA: Insufficient documentation

## 2024-01-01 ENCOUNTER — Other Ambulatory Visit: Payer: Self-pay

## 2024-01-01 ENCOUNTER — Encounter: Payer: Self-pay | Admitting: Hematology

## 2024-01-01 ENCOUNTER — Ambulatory Visit

## 2024-01-01 VITALS — BP 148/74 | HR 76 | Temp 97.0°F | Resp 12

## 2024-01-01 DIAGNOSIS — D3911 Neoplasm of uncertain behavior of right ovary: Secondary | ICD-10-CM

## 2024-01-01 DIAGNOSIS — D3910 Neoplasm of uncertain behavior of unspecified ovary: Secondary | ICD-10-CM | POA: Insufficient documentation

## 2024-01-01 LAB — COMPREHENSIVE METABOLIC PANEL
ALT: 23 U/L (ref 0–35)
AST: 26 U/L (ref 0–35)
Albumin: 3.9 g/dL (ref 3.5–5.2)
Alk Phos: 63 U/L (ref 35–105)
Anion Gap: 11 (ref 7–16)
Bilirubin,Total: 0.4 mg/dL (ref 0.0–1.2)
CO2: 27 mmol/L (ref 20–28)
Calcium: 9.3 mg/dL (ref 8.6–10.2)
Chloride: 104 mmol/L (ref 96–108)
Creatinine: 1.14 mg/dL — ABNORMAL HIGH (ref 0.51–0.95)
Glucose: 108 mg/dL — ABNORMAL HIGH (ref 60–99)
Lab: 13 mg/dL (ref 6–20)
Potassium: 3.7 mmol/L (ref 3.3–5.1)
Sodium: 142 mmol/L (ref 133–145)
Total Protein: 6.2 g/dL — ABNORMAL LOW (ref 6.3–7.7)
eGFR BY CREAT: 50 — AB

## 2024-01-01 LAB — CBC AND DIFFERENTIAL
Baso # K/uL: 0.1 THOU/uL (ref 0.0–0.2)
Eos # K/uL: 0.1 THOU/uL (ref 0.0–0.5)
Hematocrit: 35 % (ref 34–49)
Hemoglobin: 11.2 g/dL (ref 11.2–16.0)
IMM Granulocytes #: 0 THOU/uL (ref 0–0)
IMM Granulocytes: 0.5 %
Lymph # K/uL: 1.3 THOU/uL (ref 1.0–5.0)
MCV: 102 fL — ABNORMAL HIGH (ref 75–100)
Mono # K/uL: 0.5 THOU/uL (ref 0.1–1.0)
Neut # K/uL: 2.2 THOU/uL (ref 1.5–6.5)
Nucl RBC # K/uL: 0 THOU/uL
Nucl RBC %: 0 /100{WBCs} (ref 0.0–0.2)
Platelets: 254 THOU/uL (ref 150–450)
RBC: 3.4 MIL/uL — ABNORMAL LOW (ref 4.0–5.5)
RDW: 17 % — ABNORMAL HIGH (ref 0.0–15.0)
Seg Neut %: 53.3 %
WBC: 4.2 THOU/uL (ref 3.5–11.0)

## 2024-01-01 LAB — MAGNESIUM: Magnesium: 1.8 mg/dL (ref 1.6–2.5)

## 2024-01-01 MED ORDER — PALBOCICLIB 100 MG PO TABS *A*
100.0000 mg | ORAL_TABLET | Freq: Every day | ORAL | 5 refills | Status: DC
  Filled 2024-01-01: qty 21, 28d supply, fill #0

## 2024-01-01 MED ORDER — SODIUM CHLORIDE 0.9 % INJ (FLUSH) WRAPPED *I*
10.0000 mL | Status: DC | PRN
  Administered 2024-01-01: 10 mL

## 2024-01-01 NOTE — Progress Notes (Signed)
 Patient arrived for port draw. Patient has no concerns at this time. IVAD accessed and flushed well with brisk blood return. Standing labs drawn and collected. Weekly appts for labs already scheduled through December 23rd, confirmed with pt. IVAD flushed with normal saline and de-accessed. NO heparin  flush because the patient is returning within two months.

## 2024-01-01 NOTE — Addendum Note (Signed)
 Addended by: Tiannah Greenly on: 01/01/2024 01:04 PM Modules accepted: Orders

## 2024-01-03 LAB — INHIBIN B: Inhibin B: 65 pg/mL — ABNORMAL HIGH (ref <=11)

## 2024-01-07 ENCOUNTER — Other Ambulatory Visit: Payer: Self-pay

## 2024-01-08 ENCOUNTER — Ambulatory Visit: Attending: Hematology

## 2024-01-08 DIAGNOSIS — D3911 Neoplasm of uncertain behavior of right ovary: Secondary | ICD-10-CM | POA: Insufficient documentation

## 2024-01-08 LAB — COMPREHENSIVE METABOLIC PANEL
ALT: 23 U/L (ref 0–35)
AST: 25 U/L (ref 0–35)
Albumin: 4 g/dL (ref 3.5–5.2)
Alk Phos: 80 U/L (ref 35–105)
Anion Gap: 10 (ref 7–16)
Bilirubin,Total: 0.3 mg/dL (ref 0.0–1.2)
CO2: 26 mmol/L (ref 20–28)
Calcium: 9.6 mg/dL (ref 8.6–10.2)
Chloride: 106 mmol/L (ref 96–108)
Creatinine: 1.1 mg/dL — ABNORMAL HIGH (ref 0.51–0.95)
Glucose: 114 mg/dL — ABNORMAL HIGH (ref 60–99)
Lab: 12 mg/dL (ref 6–20)
Potassium: 3.8 mmol/L (ref 3.3–5.1)
Sodium: 142 mmol/L (ref 133–145)
Total Protein: 6.6 g/dL (ref 6.3–7.7)
eGFR BY CREAT: 52 — AB

## 2024-01-08 LAB — DIFF MANUAL: Diff Based On: 115 {cells}

## 2024-01-08 LAB — MAGNESIUM: Magnesium: 1.8 mg/dL (ref 1.6–2.5)

## 2024-01-08 LAB — CBC AND DIFFERENTIAL
Baso # K/uL: 0 THOU/uL (ref 0.0–0.2)
Eos # K/uL: 0 THOU/uL (ref 0.0–0.5)
Hematocrit: 35 % (ref 34–49)
Hemoglobin: 10.9 g/dL — ABNORMAL LOW (ref 11.2–16.0)
Lymph # K/uL: 0.8 THOU/uL — ABNORMAL LOW (ref 1.0–5.0)
MCV: 102 fL — ABNORMAL HIGH (ref 75–100)
Mono # K/uL: 0.2 THOU/uL (ref 0.1–1.0)
Neut # K/uL: 2.4 THOU/uL (ref 1.5–6.5)
Nucl RBC # K/uL: 0 THOU/uL
Nucl RBC %: 0 /100{WBCs} (ref 0.0–0.2)
Platelets: 228 THOU/uL (ref 150–450)
RBC: 3.4 MIL/uL — ABNORMAL LOW (ref 4.0–5.5)
RDW: 16.7 % — ABNORMAL HIGH (ref 0.0–15.0)
Seg Neut %: 69.5 %
WBC: 3.4 THOU/uL — ABNORMAL LOW (ref 3.5–11.0)

## 2024-01-08 LAB — RBC MORPHOLOGY

## 2024-01-08 NOTE — Progress Notes (Signed)
 Patient arrived for port draw. IVAD accessed, flushes well with brisk blood return. Standing labs drawn and collected. IVAD flushed with saline and de-accessed. Returning in one week- AVS declined, uses mychart.

## 2024-01-12 LAB — INHIBIN B: Inhibin B: 61 pg/mL — ABNORMAL HIGH (ref <=11)

## 2024-01-13 ENCOUNTER — Other Ambulatory Visit: Payer: Self-pay | Admitting: Hematology

## 2024-01-13 DIAGNOSIS — D3911 Neoplasm of uncertain behavior of right ovary: Secondary | ICD-10-CM

## 2024-01-14 ENCOUNTER — Other Ambulatory Visit: Payer: Self-pay

## 2024-01-15 ENCOUNTER — Ambulatory Visit: Attending: Hematology

## 2024-01-15 DIAGNOSIS — D3911 Neoplasm of uncertain behavior of right ovary: Secondary | ICD-10-CM | POA: Insufficient documentation

## 2024-01-15 LAB — COMPREHENSIVE METABOLIC PANEL
ALT: 24 U/L (ref 0–35)
AST: 28 U/L (ref 0–35)
Albumin: 4.1 g/dL (ref 3.5–5.2)
Alk Phos: 62 U/L (ref 35–105)
Anion Gap: 12 (ref 7–16)
Bilirubin,Total: 0.6 mg/dL (ref 0.0–1.2)
CO2: 26 mmol/L (ref 20–28)
Calcium: 9.4 mg/dL (ref 8.6–10.2)
Chloride: 103 mmol/L (ref 96–108)
Creatinine: 1.17 mg/dL — ABNORMAL HIGH (ref 0.51–0.95)
Glucose: 105 mg/dL — ABNORMAL HIGH (ref 60–99)
Lab: 14 mg/dL (ref 6–20)
Potassium: 3.7 mmol/L (ref 3.3–5.1)
Sodium: 141 mmol/L (ref 133–145)
Total Protein: 6.5 g/dL (ref 6.3–7.7)
eGFR BY CREAT: 48 — AB

## 2024-01-15 LAB — RBC MORPHOLOGY

## 2024-01-15 LAB — CBC AND DIFFERENTIAL
Baso # K/uL: 0 THOU/uL (ref 0.0–0.2)
Eos # K/uL: 0.1 THOU/uL (ref 0.0–0.5)
Hematocrit: 33 % — ABNORMAL LOW (ref 34–49)
Hemoglobin: 11 g/dL — ABNORMAL LOW (ref 11.2–16.0)
Lymph # K/uL: 1.5 THOU/uL (ref 1.0–5.0)
MCV: 102 fL — ABNORMAL HIGH (ref 75–100)
Mono # K/uL: 0 THOU/uL — ABNORMAL LOW (ref 0.1–1.0)
Neut # K/uL: 0.9 THOU/uL — ABNORMAL LOW (ref 1.5–6.5)
Nucl RBC # K/uL: 0 THOU/uL
Nucl RBC %: 0 /100{WBCs} (ref 0.0–0.2)
Platelets: 148 THOU/uL — ABNORMAL LOW (ref 150–450)
RBC: 3.3 MIL/uL — ABNORMAL LOW (ref 4.0–5.5)
RDW: 17.4 % — ABNORMAL HIGH (ref 0.0–15.0)
Seg Neut %: 34.8 %
WBC: 2.5 THOU/uL — ABNORMAL LOW (ref 3.5–11.0)

## 2024-01-15 LAB — DIFF MANUAL: Diff Based On: 115 {cells}

## 2024-01-15 LAB — MAGNESIUM: Magnesium: 1.8 mg/dL (ref 1.6–2.5)

## 2024-01-15 NOTE — Progress Notes (Signed)
 Patient arrived for port draw. IVAD accessed, flushes well with brisk blood return. Standing labs drawn and collected. IVAD flushed with saline and de-accessed. **Patient reports she woke up feeling dizzy this morning. Described it as swaying. Reports she hasn't eaten this morning. VSS. This has happened before 2 weeks ago when she was dehydrated and she was treated with IVF in the ED- per patient and husband. They declined staying for IVF today in our center and writer explained that if she feels this way tomorrow she should contact GYN office and try to get in for IVF at our center. Writer also suggested electrolyte drink today such as sugar free Gatorade or Pedialyte. Patient appreciative of recommendations. Returning next week for port draw, AVS declined, uses mychart.

## 2024-01-16 ENCOUNTER — Other Ambulatory Visit: Payer: Self-pay

## 2024-01-18 ENCOUNTER — Other Ambulatory Visit: Payer: Self-pay

## 2024-01-21 ENCOUNTER — Other Ambulatory Visit: Payer: Self-pay

## 2024-01-21 LAB — INHIBIN B: Inhibin B: 65 pg/mL — ABNORMAL HIGH (ref <=11)

## 2024-01-21 NOTE — Patient Instructions (Signed)
 December 2025  Sunday Monday Tuesday Wednesday Thursday Friday Saturday   1FOLLOW UP VISIT 8:00 AM (15 min.) Shlomo Hubert NOVAK, MD, PhD [facility] 2PORT DRAW10:30 AM (30 min.) Verdon Craven, RN [facility] 3 [phone]  9PORT DRAW10:30 AM (30 min.) Enedina Odor, RN [facility] 10 11 12 13 14 15  16PORT DRAW10:00 AM (30 min.) Enedina Odor, RN [facility] 17 18 19 20 21 22  23PORT DRAW10:00 AM (30 min.) CAN CTR, HH POD B [facility] 24 25 26 27 28  29FOLLOW UP VISIT 2:15 PM (15 min.) Shlomo Hubert NOVAK, MD, PhD [facility] at [facility] 30Ibrance PORT DRAW10:00 AM (30 min.) CAN CTR, HH POD F [facility] 31Ibrance       January 2026  Sunday Monday Tuesday Wednesday Thursday Friday Saturday      1Ibrance  2Ibrance  3Ibrance  4Ibrance  5Ibrance  6Ibrance  7Ibrance  8Ibrance  9Ibrance  10Ibrance  11Ibrance  12Ibrance  13Ibrance  14Ibrance  15Ibrance  16Ibrance  17Ibrance  18Ibrance  19Ibrance  20OFF --> 21 22 23 24 25 26  27Next cycle due  28 29 30  31

## 2024-01-22 ENCOUNTER — Ambulatory Visit: Attending: Hematology

## 2024-01-22 DIAGNOSIS — D3911 Neoplasm of uncertain behavior of right ovary: Secondary | ICD-10-CM | POA: Insufficient documentation

## 2024-01-22 LAB — CBC AND DIFFERENTIAL
Baso # K/uL: 0 THOU/uL (ref 0.0–0.2)
Eos # K/uL: 0.1 THOU/uL (ref 0.0–0.5)
Hematocrit: 31 % — ABNORMAL LOW (ref 34–49)
Hemoglobin: 10.2 g/dL — ABNORMAL LOW (ref 11.2–16.0)
Lymph # K/uL: 1.1 THOU/uL (ref 1.0–5.0)
MCV: 102 fL — ABNORMAL HIGH (ref 75–100)
Mono # K/uL: 0.1 THOU/uL (ref 0.1–1.0)
Neut # K/uL: 1 THOU/uL — ABNORMAL LOW (ref 1.5–6.5)
Nucl RBC # K/uL: 0 THOU/uL
Nucl RBC %: 0 /100{WBCs} (ref 0.0–0.2)
Platelets: 105 THOU/uL — ABNORMAL LOW (ref 150–450)
RBC: 3 MIL/uL — ABNORMAL LOW (ref 4.0–5.5)
RDW: 18.4 % — ABNORMAL HIGH (ref 0.0–15.0)
Seg Neut %: 43.1 %
WBC: 2.3 THOU/uL — ABNORMAL LOW (ref 3.5–11.0)

## 2024-01-22 LAB — DIFF MANUAL: Diff Based On: 109 {cells}

## 2024-01-22 LAB — COMPREHENSIVE METABOLIC PANEL
ALT: 20 U/L (ref 0–35)
AST: 24 U/L (ref 0–35)
Albumin: 3.9 g/dL (ref 3.5–5.2)
Alk Phos: 63 U/L (ref 35–105)
Anion Gap: 11 (ref 7–16)
Bilirubin,Total: 0.4 mg/dL (ref 0.0–1.2)
CO2: 25 mmol/L (ref 20–28)
Calcium: 9.2 mg/dL (ref 8.6–10.2)
Chloride: 106 mmol/L (ref 96–108)
Creatinine: 1.13 mg/dL — ABNORMAL HIGH (ref 0.51–0.95)
Glucose: 136 mg/dL — ABNORMAL HIGH (ref 60–99)
Lab: 14 mg/dL (ref 6–20)
Potassium: 3.4 mmol/L (ref 3.3–5.1)
Sodium: 142 mmol/L (ref 133–145)
Total Protein: 6.3 g/dL (ref 6.3–7.7)
eGFR BY CREAT: 50 — AB

## 2024-01-22 LAB — RBC MORPHOLOGY

## 2024-01-22 LAB — MAGNESIUM: Magnesium: 1.7 mg/dL (ref 1.6–2.5)

## 2024-01-22 NOTE — Progress Notes (Signed)
 Patient here for port draw. IVAD accessed, flushes well with brisk blood return. Standing labs drawn and collected. IVAD flushed with saline and de-accessed- site benign. Returning in one week- AVS declined, uses mychart.

## 2024-01-24 LAB — INHIBIN B: Inhibin B: 60 pg/mL — ABNORMAL HIGH (ref <=11)

## 2024-01-25 ENCOUNTER — Other Ambulatory Visit: Payer: Self-pay

## 2024-01-27 ENCOUNTER — Other Ambulatory Visit: Payer: Self-pay

## 2024-01-27 NOTE — Progress Notes (Unsigned)
 Chief Compliant  Julie Walsh is a 73 y.o. female who presents to the Gynecologic Oncology Clinic at [facility] on 01/28/2024 for a follow-up medical oncology visit.Gynecologic Oncologst: Dr. Josefina. [name] History of Present Illness Julie Walsh is a 73 y.o. female with a history of recurrent granulosa cell ovarian cancer. Initially diagnosed in 03/2010 after presenting with PMB. She is s/p laparoscopic BSO in 2012, and s/p ex-lap, resection of cecum and terminal ileum with primary reanastomosis, appendectomy, TAH, optimal tumor debulking, mobilization of the hepatic flexure and LOH in 2020. She received adjuvant chemotherapy (Carbo/Taxol ) and started maintenance letrozole  in 03/2019-present. She underwent SBRT in 03/2022 for left pelvic lesion, and is now s/p ex-lap with small bowel resection with reanastomosis, peritoneal implants, removal of portion of omentum, LOH with oversewing serotomy, and optimal microscopic cytoreduction on 06/05/23. She then received 3 cycles of adjuvant Carbo/Taxol . She had a syncopal episode with C#1 and was found to have pneumonia, and Taxol  was dose reduced with C#2 due to worsening neuropathy. She had repeat CT scans on after 4 cycles due to slowly declining Inhibin B levels which showed concern for disease progression. She has continued on letrozole . She was transitioned to palbociclib  and completed about 2 weeks of C1 before this was held on 9/18 due to low ANC, this has since been resumed but is now held again due to low ANC. Interval History:Julie Walsh presents today for follow-up on palbociclib  and letrozole .  She reports she has been feeling well overall. She does report a recent episode of pain which she had epigastric pain for about 3 hours. She describes it as sharp, denies an association with eating/drinking, She reports pain resolved after a large bowel movement but denies constipation or diarrhea at baseline. She reports no further episodes of  dizziness and/or balance issues. Reports normal appetite. Denies current abdominal pain or additional episodes. Denies urinary sxs, fevers, chills, N/V. Oncologic History Oncology History Overview Note 11/15/2010  - D&C, BSO (Dr. Medford): Right ovary: granulosa cell tumor . Left ovary: benign. Uterus: hyperplasia without atypia. 07/2018 -  ex-lap, resection of cecum and terminal ileum with primary reanastomosis, appendectomy, total abdominal hysterectomy, optimal tumor debulking, mobilization of the hepatic flexure and lysis of adhesions             12 cm granulosa cell tumor, present on serosal surfaces.Had adjuvant chemotherapy (Carbo/Taxol ) 03/2019 - began Letrozole4/4/23 - Inhibin B: 43. 11/24/21 - MRI Pelvis: 1.3 cm cystic structure in left adnexa, enhancement, nonspecific11/6/23 - CT: 2.5 cm left adnexal lesion, stable. 01/26/22 - PET/CT: 2.5 cm lesion in left adnexa with minimal FDG uptake. 01/20/22 - Inhibin B = 63 (normal is <11)03/22/22--03/28/22 EBRT Left Pelvis 4000 cGy Primary ovarian granulosa cell tumor, right 09/27/2023 Initial Diagnosis  Primary ovarian granulosa cell tumor, right 09/27/2023 -  Chemotherapy  OP Breast Palbociclib  (Ibrance ) and Aromatase InhibitorPlan Provider: Hubert KATHEE Bihari, MD, PhDTreatment goal: PalliativeLine of treatment: Second Line Medical History  PMX, SH, SurgHx, Allergies, Medications Reviewed at todays visit and pertinent changes recorded. Review of Systems See HPIPhysical Exam BP 130/70 (BP Location: Left arm, Patient Position: Sitting, Cuff Size: adult)   Pulse 87   Temp 36.2 C (97.2 F) (Temporal)   Resp 18   Ht 152.4 cm (5')   Wt 68.9 kg (151 lb 14.4 oz)   SpO2 95%   BMI 29.67 kg/m GENERAL: chronically ill-appearing, non-toxic and in no acute distressHEENT: normocephalic, no scleral icterus, mucous membranes moistLUNGS: clear to auscultation bilaterally, no  wheezes/rales/rhonchiHEART: regular rate  and rhythm, normal S1 and S2, no murmurs/rub/gallopABDOMEN: soft, non-tender, non-distended, normal bowel soundsEXTREMITIES: symmetrical, no pedal edema or calf tendernessSKIN: no visible lesions or rashesNEURO: alert and oriented, PEERL, no facial asymmetry noted, normal speech, bilateral upper and lower extremity strength 5/5 in seated position, normal gateECOG PERFORMANCE STATUS*Grade ECOG 0 Fully active, able to carry on all pre-disease performance without restriction 1 Restricted in physically strenuous activity but ambulatory and able to carry out work of a light or sedentary nature, e.g., light house work, office work 2 Ambulatory and capable of all selfcare but unable to carry out any work activities. Up and about more than 50% of waking hours 3 Capable of only limited selfcare, confined to bed or chair more than 50% of waking hours 4 Completely disabled. Cannot carry on any selfcare. Totally confined to bed or chair 5 Dead * As published in Am. J. Clin. Oncol.:Oken, M.M., Leanne, R.H., [place], D.C., Bari, Julie Walsh, P.P.: Toxicity And Response Criteria Of [facility]. Am JINNY Bridges Julie Walsh 4:350-344, 1982.Laboratory Data   Lab results: 12/23/251018 WBC 2.3* Hemoglobin 10.2* Hematocrit 31* RBC 3.0* Platelets 105* Neut # K/uL 1.0* Lymph # K/uL 1.1 Mono # K/uL 0.1 Eos # K/uL 0.1 Baso # K/uL 0.0 Seg Neut % 43.1   Lab results: 12/23/251018 Sodium 142 Potassium 3.4 Chloride 106 CO2 25 UN 14 Creatinine 1.13* Glucose 136* Calcium  9.2 Total Protein 6.3 Albumin 3.9 ALT 20 AST 24 Alk Phos 63 Bilirubin,Total 0.4 Pathology **ORIGINAL REPORT DATE: 06/14/2023** A. Small bowel, implant, biopsy:  -Granulosa cell tumor. B. Transverse colon, implant, biopsy:  -Granulosa cell tumor. C. Pelvic, small bowel  implant, biopsy:  -Benign adipose tissue with hemorrhage. D. Small bowel implants #2, biopsy:  -Granulosa cell tumor. E. Omentum, partial omentectomy:  -Adipose tissue involved by scattered small foci of granulosa cell tumor. F. Colonic implant, biopsy:  -Fibroadipose tissue involved by scattered small foci of atypical cells, suspicious for granulosa cell tumor. G. Soft tissue, right upper quadrant mass, resection:  -Two benign lymph nodes (0/2).  -Benign adipose tissue. H. Small bowel, mass, resection:  -Granulosa cell tumor, with extensive intratumoral hemorrhage and cystic formation, predominantly involving small intestinal serosa/subserosa and muscularis propria.  -Background small intestinal mucosa with mild intraepithelial lymphocytosis.  -Resection margins negative for tumor.  -See comment. COMMENT: The patient's reported history of recurrent granulosa cell tumor status post treatment is noted. The tumor cells demonstrate classic morphology featuring Small, bland, cuboidal to polygonal cells with scant cytoplasm and pale, uniform angulated and usually grooved nuclei, growing in microfollicular, pseudopapillary, trabecular and corded patterns, with extensive intratumoral hemorrhage and cystic formation. Immunostains performed on Block H3 show tumor cells to be positive for SF1 and inhibin, focal weakly positive for Calretinin, AE1/AE3, CAM5.2, and EMA, while negative for CK7. The tumor morphology and immunoprofile are compatible with known history of granulosa cell tumor. Intradepartmental consultation obtained. Imaging Data All imaging data was personally reviewed by me and findings are per the HPI.Assessment  Ms. Kimpel is a 43 with recurrent, metastatic ovarian granulosa cell cancer.  We have discussed the incurable/indolent nature of this disease. We reviewed that any treatment would be given with the intention of helping  her live longer and better. We discussed the alternative to treatment, which is best supportive care-- and that given the slow growth rate of this cancer, some people may be able to live years without treatment.  Started on Palbociclib  + Letrozole  in late August/early September 2025. Has  had some delays for neutropenia and dose was ultimately reduced. Most recent CT imaging shows overall stability, but a mild increase in a soft tissue nodule beside the ascending colon, which we will follow on future imaging. Tolerating current treatment well. Plan - Continue palbociclib  100 mg daily - 3 weeks on, 1 week off, pending labs.- Continue letrozole  2.5 mg daily. - Labs reviewed, okay to continue at current dose. - Continue to trend inhibins.- History of DVT: continue Eliquis  indefinitely- Other future options: bevacizumab may be an option (but concerned to use now given risk of falls and concurrent blood thinner use), other hormonal blocking agents, and clinical trials (none currently open)RTC 4 weeks or sooner as needed.Patient seen and plan developed with Dr. Shlomo.I saw and evaluated [name] DELENA Sor with Dr. Nidia. I agree with the assessment and plan as documented above and have edited the note to reflect my own input. I have performed a substantive portion of the visit including developing and discussing the management plan. Hubert Shlomo MD, PHDAssociate Professor of Medicine and OncologyDivision of Gynecologic Oncology This patient encounter meets a high level of medical decision making. I had the opportunity to review, address, and impact one acute or chronic illness (active malignancy) that poses a threat to bodily function. The patient is on active cancer treatment that needs intensive monitoring for toxicity. I had the opportunity do at least 2 of the 3: to review results of unique tests or ordering unique tests or review prior external notes in Epic; and independent  interpretation of tests or discussion of management or test interpretation with external physicians/other qualified health care professional/appropriate source.  ADDENDUM:  After visit ANC returned at 900.  We have dose reduced palbociclib  further down to 75 mg daily to start when ANC is >1000

## 2024-01-28 ENCOUNTER — Other Ambulatory Visit: Payer: Self-pay

## 2024-01-28 ENCOUNTER — Ambulatory Visit: Attending: Hematology | Admitting: Hematology

## 2024-01-28 VITALS — BP 130/70 | HR 87 | Temp 97.2°F | Resp 18 | Ht 60.0 in | Wt 151.9 lb

## 2024-01-28 DIAGNOSIS — D696 Thrombocytopenia, unspecified: Secondary | ICD-10-CM | POA: Insufficient documentation

## 2024-01-28 DIAGNOSIS — C563 Malignant neoplasm of bilateral ovaries: Secondary | ICD-10-CM | POA: Insufficient documentation

## 2024-01-28 DIAGNOSIS — D709 Neutropenia, unspecified: Secondary | ICD-10-CM | POA: Insufficient documentation

## 2024-01-28 DIAGNOSIS — Z09 Encounter for follow-up examination after completed treatment for conditions other than malignant neoplasm: Secondary | ICD-10-CM | POA: Insufficient documentation

## 2024-01-29 ENCOUNTER — Encounter: Payer: Self-pay | Admitting: Hematology

## 2024-01-29 ENCOUNTER — Telehealth: Payer: Self-pay

## 2024-01-29 ENCOUNTER — Other Ambulatory Visit: Payer: Self-pay | Admitting: Obstetrics and Gynecology

## 2024-01-29 ENCOUNTER — Other Ambulatory Visit: Payer: Self-pay

## 2024-01-29 ENCOUNTER — Other Ambulatory Visit: Payer: Self-pay | Admitting: Hematology

## 2024-01-29 ENCOUNTER — Ambulatory Visit: Attending: Hematology

## 2024-01-29 VITALS — BP 119/64 | HR 72 | Temp 96.4°F | Resp 14

## 2024-01-29 DIAGNOSIS — D3910 Neoplasm of uncertain behavior of unspecified ovary: Secondary | ICD-10-CM | POA: Insufficient documentation

## 2024-01-29 DIAGNOSIS — D3911 Neoplasm of uncertain behavior of right ovary: Secondary | ICD-10-CM

## 2024-01-29 LAB — CBC AND DIFFERENTIAL
Baso # K/uL: 0.1 THOU/uL (ref 0.0–0.2)
Eos # K/uL: 0.1 THOU/uL (ref 0.0–0.5)
Hematocrit: 33 % — ABNORMAL LOW (ref 34–49)
Hemoglobin: 10.7 g/dL — ABNORMAL LOW (ref 11.2–16.0)
Lymph # K/uL: 1.6 THOU/uL (ref 1.0–5.0)
MCV: 104 fL — ABNORMAL HIGH (ref 75–100)
Mono # K/uL: 0.2 THOU/uL (ref 0.1–1.0)
Neut # K/uL: 0.9 THOU/uL — ABNORMAL LOW (ref 1.5–6.5)
Nucl RBC # K/uL: 0 THOU/uL
Nucl RBC %: 0 /100{WBCs} (ref 0.0–0.2)
Platelets: 133 THOU/uL — ABNORMAL LOW (ref 150–450)
RBC: 3.2 MIL/uL — ABNORMAL LOW (ref 4.0–5.5)
RDW: 19 % — ABNORMAL HIGH (ref 0.0–15.0)
Seg Neut %: 31.9 %
WBC: 2.9 THOU/uL — ABNORMAL LOW (ref 3.5–11.0)

## 2024-01-29 LAB — COMPREHENSIVE METABOLIC PANEL
ALT: 25 U/L (ref 0–35)
AST: 27 U/L (ref 0–35)
Albumin: 3.9 g/dL (ref 3.5–5.2)
Alk Phos: 68 U/L (ref 35–105)
Anion Gap: 10 (ref 7–16)
Bilirubin,Total: 0.3 mg/dL (ref 0.0–1.2)
CO2: 26 mmol/L (ref 20–28)
Calcium: 9.1 mg/dL (ref 8.6–10.2)
Chloride: 107 mmol/L (ref 96–108)
Creatinine: 1.07 mg/dL — ABNORMAL HIGH (ref 0.51–0.95)
Glucose: 118 mg/dL — ABNORMAL HIGH (ref 60–99)
Lab: 13 mg/dL (ref 6–20)
Potassium: 3.6 mmol/L (ref 3.3–5.1)
Sodium: 143 mmol/L (ref 133–145)
Total Protein: 6.4 g/dL (ref 6.3–7.7)
eGFR BY CREAT: 54 — AB

## 2024-01-29 LAB — MAGNESIUM: Magnesium: 1.9 mg/dL (ref 1.6–2.5)

## 2024-01-29 LAB — DIFF MANUAL
Diff Based On: 113 {cells}
React Lymph %: 0.9 % (ref 0.0–6.0)

## 2024-01-29 LAB — RBC MORPHOLOGY

## 2024-01-29 MED ORDER — PALBOCICLIB 75 MG PO TABS *A*
75.0000 mg | ORAL_TABLET | Freq: Every day | ORAL | 3 refills | Status: AC
  Filled 2024-01-29: qty 21, 28d supply, fill #0
  Filled 2024-02-25: qty 21, 28d supply, fill #1

## 2024-01-29 MED ORDER — SODIUM CHLORIDE 0.9 % INJ (FLUSH) WRAPPED *I*
10.0000 mL | Status: AC | PRN
  Administered 2024-01-29: 10 mL

## 2024-01-29 NOTE — Progress Notes (Signed)
 Pt arrived to infusion center for weekly Port draw. VSS. IVAD accessed without issues, blood return noted. Standing turner Labs obtained and sent.Inhibin on ice- delivered to lab. IVAD de-accesesed. Flushed with NS per protocol. Pt to return for next appointment 02/05/24. AVS declined, uses MyChart.

## 2024-01-29 NOTE — Telephone Encounter (Signed)
 Called patient to let her know Dr. Shlomo would like her to hold Palbociclib  for a week since ANC is 0.9. Patient agreeable. Next port draw is scheduled on 02/05/2024 - if ANC is over 1 then she can restart Palbociclib  at a dose lower. New script sent in to pharmacy today. Will update pharmacist on change. Patient verbalizes understanding and is agreeable to plan. Encouraged to call with any questions or concerns.

## 2024-01-30 ENCOUNTER — Other Ambulatory Visit: Payer: Self-pay

## 2024-02-02 LAB — INHIBIN B: Inhibin B: 63 pg/mL — ABNORMAL HIGH (ref <=11)

## 2024-02-04 ENCOUNTER — Other Ambulatory Visit: Payer: Self-pay

## 2024-02-05 ENCOUNTER — Other Ambulatory Visit: Payer: Self-pay

## 2024-02-05 ENCOUNTER — Ambulatory Visit: Attending: Hematology

## 2024-02-05 VITALS — BP 109/55 | HR 74 | Temp 97.5°F | Resp 16

## 2024-02-05 DIAGNOSIS — D3911 Neoplasm of uncertain behavior of right ovary: Secondary | ICD-10-CM | POA: Insufficient documentation

## 2024-02-05 LAB — CBC AND DIFFERENTIAL
Baso # K/uL: 0.1 THOU/uL (ref 0.0–0.2)
Eos # K/uL: 0.1 THOU/uL (ref 0.0–0.5)
Hematocrit: 37 % (ref 34–49)
Hemoglobin: 11.4 g/dL (ref 11.2–16.0)
IMM Granulocytes #: 0 THOU/uL (ref 0–0)
IMM Granulocytes: 0.2 %
Lymph # K/uL: 1.7 THOU/uL (ref 1.0–5.0)
MCV: 103 fL — ABNORMAL HIGH (ref 75–100)
Mono # K/uL: 0.5 THOU/uL (ref 0.1–1.0)
Neut # K/uL: 1.7 THOU/uL (ref 1.5–6.5)
Nucl RBC # K/uL: 0 THOU/uL
Nucl RBC %: 0 /100{WBCs} (ref 0.0–0.2)
Platelets: 288 THOU/uL (ref 150–450)
RBC: 3.6 MIL/uL — ABNORMAL LOW (ref 4.0–5.5)
RDW: 17.8 % — ABNORMAL HIGH (ref 0.0–15.0)
Seg Neut %: 41.9 %
WBC: 4 THOU/uL (ref 3.5–11.0)

## 2024-02-05 LAB — COMPREHENSIVE METABOLIC PANEL
ALT: 24 U/L (ref 0–35)
AST: 25 U/L (ref 0–35)
Albumin: 4 g/dL (ref 3.5–5.2)
Alk Phos: 63 U/L (ref 35–105)
Anion Gap: 11 (ref 7–16)
Bilirubin,Total: 0.4 mg/dL (ref 0.0–1.2)
CO2: 27 mmol/L (ref 20–28)
Calcium: 9.5 mg/dL (ref 8.6–10.2)
Chloride: 105 mmol/L (ref 96–108)
Creatinine: 1.15 mg/dL — ABNORMAL HIGH (ref 0.51–0.95)
Glucose: 121 mg/dL — ABNORMAL HIGH (ref 60–99)
Lab: 15 mg/dL (ref 6–20)
Potassium: 3.7 mmol/L (ref 3.3–5.1)
Sodium: 143 mmol/L (ref 133–145)
Total Protein: 6.5 g/dL (ref 6.3–7.7)
eGFR BY CREAT: 49 — AB

## 2024-02-05 LAB — MAGNESIUM: Magnesium: 1.9 mg/dL (ref 1.6–2.5)

## 2024-02-05 MED ORDER — SODIUM CHLORIDE 0.9 % INJ (FLUSH) WRAPPED *I*: 10.0000 mL | Status: DC | PRN

## 2024-02-05 NOTE — Progress Notes (Signed)
 IVAD accessed with great flush and blood return noted. Labs drawn and collected. NS flushed. No concerns at time.

## 2024-02-05 NOTE — Patient Instructions (Signed)
 January 2026  Sunday Monday Tuesday Wednesday Thursday Friday Saturday      1 2 3 4 5  6PORT DRAW 9:30 AM (30 min.) Tish Ming, RN [facility] 7Ibrance 8Ibrance 9Ibrance 10Ibrance 11Ibrance 12Ibrance 13IbrancePORT DRAW 9:30 AM (30 min.) CAN CTR, HH POD [facility] 14Ibrance 15Ibrance 16Ibrance 17Ibrance 18Ibrance 19Ibrance 20IbrancePORT DRAW 9:30 AM (30 min.) CAN CTR, HH POD A [facility] 21Ibrance 22Ibrance 23Ibrance 24Ibrance 25Ibrance 26IbranceFOLLOW UP VISIT 1:40 PM (40 min.) Norman Domino, [place] [facility] 27IbrancePORT DRAW 9:30 AM (30 min.) CAN CTR, HH POD C [facility] 28OFF --> 29 30 31   Next cycle due 2/4

## 2024-02-09 LAB — INHIBIN B: Inhibin B: 65 pg/mL — ABNORMAL HIGH (ref <=11)

## 2024-02-11 ENCOUNTER — Other Ambulatory Visit: Payer: Self-pay

## 2024-02-12 ENCOUNTER — Other Ambulatory Visit
Admission: RE | Admit: 2024-02-12 | Discharge: 2024-02-12 | Disposition: A | Discharge status: Home / Self Care | Source: Ambulatory Visit

## 2024-02-12 ENCOUNTER — Ambulatory Visit: Attending: Hematology

## 2024-02-12 VITALS — BP 103/59 | HR 71 | Temp 96.8°F | Resp 16

## 2024-02-12 DIAGNOSIS — D3911 Neoplasm of uncertain behavior of right ovary: Secondary | ICD-10-CM

## 2024-02-12 DIAGNOSIS — D3910 Neoplasm of uncertain behavior of unspecified ovary: Secondary | ICD-10-CM | POA: Insufficient documentation

## 2024-02-12 DIAGNOSIS — K219 Gastro-esophageal reflux disease without esophagitis: Secondary | ICD-10-CM | POA: Insufficient documentation

## 2024-02-12 LAB — COMPREHENSIVE METABOLIC PANEL
ALT: 24 U/L (ref 0–35)
AST: 27 U/L (ref 0–35)
Albumin: 3.9 g/dL (ref 3.5–5.2)
Alk Phos: 74 U/L (ref 35–105)
Anion Gap: 13 (ref 7–16)
Bilirubin,Total: 0.4 mg/dL (ref 0.0–1.2)
CO2: 24 mmol/L (ref 20–28)
Calcium: 9.4 mg/dL (ref 8.6–10.2)
Chloride: 106 mmol/L (ref 96–108)
Creatinine: 1.25 mg/dL — ABNORMAL HIGH (ref 0.51–0.95)
Glucose: 135 mg/dL — ABNORMAL HIGH (ref 60–99)
Lab: 15 mg/dL (ref 6–20)
Potassium: 3.5 mmol/L (ref 3.3–5.1)
Sodium: 143 mmol/L (ref 133–145)
Total Protein: 6.5 g/dL (ref 6.3–7.7)
eGFR BY CREAT: 45 — AB

## 2024-02-12 LAB — CBC AND DIFFERENTIAL
Baso # K/uL: 0.1 THOU/uL (ref 0.0–0.2)
Eos # K/uL: 0.1 THOU/uL (ref 0.0–0.5)
Hematocrit: 36 % (ref 34–49)
Hemoglobin: 11.3 g/dL (ref 11.2–16.0)
IMM Granulocytes #: 0 THOU/uL (ref 0–0)
IMM Granulocytes: 0.2 %
Lymph # K/uL: 1.5 THOU/uL (ref 1.0–5.0)
MCV: 103 fL — ABNORMAL HIGH (ref 75–100)
Mono # K/uL: 0.3 THOU/uL (ref 0.1–1.0)
Neut # K/uL: 2.5 THOU/uL (ref 1.5–6.5)
Nucl RBC # K/uL: 0 THOU/uL
Nucl RBC %: 0 /100{WBCs} (ref 0.0–0.2)
Platelets: 231 THOU/uL (ref 150–450)
RBC: 3.5 MIL/uL — ABNORMAL LOW (ref 4.0–5.5)
RDW: 17.2 % — ABNORMAL HIGH (ref 0.0–15.0)
Seg Neut %: 54.6 %
WBC: 4.5 THOU/uL (ref 3.5–11.0)

## 2024-02-12 LAB — VITAMIN B12: Vitamin B12: 399 pg/mL (ref 232–1245)

## 2024-02-12 LAB — FOLATE: Folate: >14 ng/mL (ref >=4.6)

## 2024-02-12 LAB — MAGNESIUM: Magnesium: 1.8 mg/dL (ref 1.6–2.5)

## 2024-02-12 MED ORDER — SODIUM CHLORIDE 0.9 % INJ (FLUSH) WRAPPED *I*
10.0000 mL | Status: DC | PRN
  Administered 2024-02-12: 10 mL

## 2024-02-12 NOTE — Progress Notes (Signed)
 Ivad flushes well, positive blood return and deaccessed.  Return appointment 1/20. Labs drawn and sent.

## 2024-02-16 LAB — INHIBIN B: Inhibin B: 63 pg/mL — ABNORMAL HIGH (ref <=11)

## 2024-02-18 ENCOUNTER — Other Ambulatory Visit: Payer: Self-pay

## 2024-02-19 ENCOUNTER — Ambulatory Visit: Attending: Hematology

## 2024-02-19 DIAGNOSIS — D3911 Neoplasm of uncertain behavior of right ovary: Secondary | ICD-10-CM | POA: Insufficient documentation

## 2024-02-19 LAB — COMPREHENSIVE METABOLIC PANEL
ALT: 19 U/L (ref 0–35)
AST: 24 U/L (ref 0–35)
Albumin: 4 g/dL (ref 3.5–5.2)
Alk Phos: 67 U/L (ref 35–105)
Anion Gap: 9 (ref 7–16)
Bilirubin,Total: 0.4 mg/dL (ref 0.0–1.2)
CO2: 27 mmol/L (ref 20–28)
Calcium: 9.8 mg/dL (ref 8.6–10.2)
Chloride: 104 mmol/L (ref 96–108)
Creatinine: 1.15 mg/dL — ABNORMAL HIGH (ref 0.51–0.95)
Glucose: 138 mg/dL — ABNORMAL HIGH (ref 60–99)
Lab: 15 mg/dL (ref 6–20)
Potassium: 3.7 mmol/L (ref 3.3–5.1)
Sodium: 140 mmol/L (ref 133–145)
Total Protein: 6.3 g/dL (ref 6.3–7.7)
eGFR BY CREAT: 49 — AB

## 2024-02-19 LAB — CBC AND DIFFERENTIAL
Baso # K/uL: 0.1 THOU/uL (ref 0.0–0.2)
Eos # K/uL: 0.1 THOU/uL (ref 0.0–0.5)
Hematocrit: 34 % (ref 34–49)
Hemoglobin: 11.2 g/dL (ref 11.2–16.0)
IMM Granulocytes #: 0 THOU/uL (ref 0–0)
IMM Granulocytes: 0.3 %
Lymph # K/uL: 1.6 THOU/uL (ref 1.0–5.0)
MCV: 101 fL — ABNORMAL HIGH (ref 75–100)
Mono # K/uL: 0.2 THOU/uL (ref 0.1–1.0)
Neut # K/uL: 1.5 THOU/uL (ref 1.5–6.5)
Nucl RBC # K/uL: 0 THOU/uL
Nucl RBC %: 0 /100{WBCs} (ref 0.0–0.2)
Platelets: 191 THOU/uL (ref 150–450)
RBC: 3.4 MIL/uL — ABNORMAL LOW (ref 4.0–5.5)
RDW: 17.4 % — ABNORMAL HIGH (ref 0.0–15.0)
Seg Neut %: 42.9 %
WBC: 3.4 THOU/uL — ABNORMAL LOW (ref 3.5–11.0)

## 2024-02-19 LAB — MAGNESIUM: Magnesium: 1.8 mg/dL (ref 1.6–2.5)

## 2024-02-19 NOTE — Progress Notes (Signed)
 Weekly Port Draw- Ivad flushes well, positive blood return and deaccessed. Labs drawn and sent. Return appointment for next week.

## 2024-02-24 ENCOUNTER — Other Ambulatory Visit: Payer: Self-pay

## 2024-02-24 LAB — INHIBIN B: Inhibin B: 75 pg/mL — ABNORMAL HIGH (ref <=11)

## 2024-02-25 ENCOUNTER — Other Ambulatory Visit: Payer: Self-pay

## 2024-02-25 ENCOUNTER — Ambulatory Visit

## 2024-02-25 VITALS — BP 130/72 | HR 84 | Wt 154.0 lb

## 2024-02-25 DIAGNOSIS — D3911 Neoplasm of uncertain behavior of right ovary: Secondary | ICD-10-CM | POA: Insufficient documentation

## 2024-02-25 DIAGNOSIS — R5383 Other fatigue: Secondary | ICD-10-CM | POA: Insufficient documentation

## 2024-02-25 DIAGNOSIS — R110 Nausea: Secondary | ICD-10-CM | POA: Insufficient documentation

## 2024-02-25 DIAGNOSIS — R194 Change in bowel habit: Secondary | ICD-10-CM | POA: Insufficient documentation

## 2024-02-25 DIAGNOSIS — R978 Other abnormal tumor markers: Secondary | ICD-10-CM | POA: Insufficient documentation

## 2024-02-25 DIAGNOSIS — R059 Cough, unspecified: Secondary | ICD-10-CM | POA: Insufficient documentation

## 2024-02-25 DIAGNOSIS — R7989 Other specified abnormal findings of blood chemistry: Secondary | ICD-10-CM | POA: Insufficient documentation

## 2024-02-25 DIAGNOSIS — Z09 Encounter for follow-up examination after completed treatment for conditions other than malignant neoplasm: Secondary | ICD-10-CM | POA: Insufficient documentation

## 2024-02-25 NOTE — Progress Notes (Signed)
 Chief Compliant  Julie Walsh is a 74 y.o. female who presents to the Gynecologic Oncology Clinic at [facility] on 02/25/2024 for a follow-up medical oncology visit.Gynecologic Oncologst: Dr. Josefina. [name] History of Present Illness Julie Walsh is a 74 y.o. female with a history of recurrent granulosa cell ovarian cancer. Initially diagnosed in 03/2010 after presenting with PMB. She is s/p laparoscopic BSO in 2012, and s/p ex-lap, resection of cecum and terminal ileum with primary reanastomosis, appendectomy, TAH, optimal tumor debulking, mobilization of the hepatic flexure and LOH in 2020. She received adjuvant chemotherapy (Carbo/Taxol ) and started maintenance letrozole  in 03/2019-present. She underwent SBRT in 03/2022 for left pelvic lesion, and is now s/p ex-lap with small bowel resection with reanastomosis, peritoneal implants, removal of portion of omentum, LOH with oversewing serotomy, and optimal microscopic cytoreduction on 06/05/23. She then received 3 cycles of adjuvant Carbo/Taxol . She had a syncopal episode with C#1 and was found to have pneumonia, and Taxol  was dose reduced with C#2 due to worsening neuropathy. She had repeat CT scans on after 4 cycles due to slowly declining Inhibin B levels which showed concern for disease progression. She has continued on letrozole . She was transitioned to palbociclib  and completed about 2 weeks of C1 before this was held on 9/18 due to low ANC, this has since been resumed but is now held again due to low ANC. Interval History:Julie Walsh presents today for follow-up on palbociclib  and letrozole . She is feeling generally well today. Fatigue is noted, able to complete normal activities. She notes improvement on the off week of treatment, she feels more like herself during this period of time. Stable intermittent cough in the morning, no upper respiratory illness symptoms. Fluctuating bowel habits, which seems to be her baseline and related to diet.  Intermittently has 24 hours of diarrhea. Avoids taking medications for bowel regimen as she is sensitive to these medications. Intermittent nausea controlled with antiemetics PRN. Denies fever/chills. Denies sore throat, rhinorrhea. Denies chest pain, shortness of breath. Denies uncontrolled nausea/vomiting. Denies changes in bladder habits. Denies new or worsening edema or pain. Denies new skin changes.Oncologic History Oncology History Overview Note 11/15/2010  - D&C, BSO (Dr. Medford): Right ovary: granulosa cell tumor . Left ovary: benign. Uterus: hyperplasia without atypia. 07/2018 -  ex-lap, resection of cecum and terminal ileum with primary reanastomosis, appendectomy, total abdominal hysterectomy, optimal tumor debulking, mobilization of the hepatic flexure and lysis of adhesions             12 cm granulosa cell tumor, present on serosal surfaces.Had adjuvant chemotherapy (Carbo/Taxol ) 03/2019 - began Letrozole4/4/23 - Inhibin B: 43. 11/24/21 - MRI Pelvis: 1.3 cm cystic structure in left adnexa, enhancement, nonspecific11/6/23 - CT: 2.5 cm left adnexal lesion, stable. 01/26/22 - PET/CT: 2.5 cm lesion in left adnexa with minimal FDG uptake. 01/20/22 - Inhibin B = 63 (normal is <11)03/22/22--03/28/22 EBRT Left Pelvis 4000 cGy Primary ovarian granulosa cell tumor, right 09/27/2023 Initial Diagnosis  Primary ovarian granulosa cell tumor, right 09/27/2023 -  Chemotherapy  OP Breast Palbociclib  (Ibrance ) and Aromatase InhibitorPlan Provider: Hubert KATHEE Bihari, MD, PhDTreatment goal: PalliativeLine of treatment: Second Line Medical History  PMX, SH, SurgHx, Allergies, Medications Reviewed at todays visit and pertinent changes recorded. Review of Systems See HPIPhysical Exam BP 130/72   Pulse 84   Wt 69.9 kg (154 lb)   SpO2 95%   BMI 30.08 kg/m GENERAL: appears fatigued, in no acute distressLUNGS: clear to auscultation bilaterally,  no wheezes/rales/rhonchiHEART: regular rate and rhythm, normal S1 and  S2, no murmurs/rub/gallopABDOMEN: soft, non-tender, non-distendedEXTREMITIES: symmetrical, no pedal edema or calf tendernessSKIN: no visible lesions or rashesNEURO: A&O x3, no focal deficitsVS reviewed.ECOG PERFORMANCE STATUS*Grade ECOG 0 Fully active, able to carry on all pre-disease performance without restriction 1 Restricted in physically strenuous activity but ambulatory and able to carry out work of a light or sedentary nature, e.g., light house work, office work 2 Ambulatory and capable of all selfcare but unable to carry out any work activities. Up and about more than 50% of waking hours 3 Capable of only limited selfcare, confined to bed or chair more than 50% of waking hours 4 Completely disabled. Cannot carry on any selfcare. Totally confined to bed or chair 5 Dead * As published in Am. J. Clin. Oncol.:Oken, M.M., Leanne, R.H., [place], D.C., Bari, DOROTHA Nicholaus HOOVER Karie DONNA Polo, P.P.: Toxicity And Response Criteria Of [facility] Group. Am JINNY Bridges Nwrno 4:350-344, 1982.Laboratory Data   Lab results: 01/20/260935 WBC 3.4* Hemoglobin 11.2 Hematocrit 34 RBC 3.4* Platelets 191 Neut # K/uL 1.5 Lymph # K/uL 1.6 Mono # K/uL 0.2 Eos # K/uL 0.1 Baso # K/uL 0.1 Seg Neut % 42.9   Lab results: 01/20/260935 Sodium 140 Potassium 3.7 Chloride 104 CO2 27 UN 15 Creatinine 1.15* Glucose 138* Calcium  9.8 Total Protein 6.3 Albumin 4.0 ALT 19 AST 24 Alk Phos 67 Bilirubin,Total 0.4 Pathology **ORIGINAL REPORT DATE: 06/14/2023** A. Small bowel, implant, biopsy:  -Granulosa cell tumor. B. Transverse colon, implant, biopsy:  -Granulosa cell tumor. C. Pelvic, small bowel implant, biopsy:  -Benign adipose tissue with hemorrhage. D. Small bowel implants #2, biopsy:  -Granulosa cell tumor. E.  Omentum, partial omentectomy:  -Adipose tissue involved by scattered small foci of granulosa cell tumor. F. Colonic implant, biopsy:  -Fibroadipose tissue involved by scattered small foci of atypical cells, suspicious for granulosa cell tumor. G. Soft tissue, right upper quadrant mass, resection:  -Two benign lymph nodes (0/2).  -Benign adipose tissue. H. Small bowel, mass, resection:  -Granulosa cell tumor, with extensive intratumoral hemorrhage and cystic formation, predominantly involving small intestinal serosa/subserosa and muscularis propria.  -Background small intestinal mucosa with mild intraepithelial lymphocytosis.  -Resection margins negative for tumor.  -See comment. COMMENT: The patient's reported history of recurrent granulosa cell tumor status post treatment is noted. The tumor cells demonstrate classic morphology featuring Small, bland, cuboidal to polygonal cells with scant cytoplasm and pale, uniform angulated and usually grooved nuclei, growing in microfollicular, pseudopapillary, trabecular and corded patterns, with extensive intratumoral hemorrhage and cystic formation. Immunostains performed on Block H3 show tumor cells to be positive for SF1 and inhibin, focal weakly positive for Calretinin, AE1/AE3, CAM5.2, and EMA, while negative for CK7. The tumor morphology and immunoprofile are compatible with known history of granulosa cell tumor. Intradepartmental consultation obtained. Imaging Data All imaging data was personally reviewed by me and findings are per the HPI.Assessment  Ms. Dowson is a 74 y.o. female with recurrent, metastatic ovarian granulosa cell cancer.  We have discussed the incurable/indolent nature of this disease. We reviewed that any treatment would be given with the intention of helping her live longer and better. We discussed the alternative to treatment, which is best supportive care-- and that given the  slow growth rate of this cancer, some people may be able to live years without treatment.  Started on Palbociclib  + Letrozole  in late August/early September 2025. Has had some delays for neutropenia and dose was ultimately reduced. Most recent CT imaging shows overall stability, but a mild increase in  a soft tissue nodule beside the ascending colon, which we will follow on future imaging. Tolerating current treatment well.Plan - Continue palbociclib  100 mg daily - 3 weeks on, 1 week off, pending labs.- Continue letrozole  2.5 mg daily. - Labs reviewed from 02/19/24. Stable elevated creatinine, WBC of 3.4 without neutropenia, inhibin B uptrending. She has been getting labs weekly. If labs remain stable after port draw tomorrow, push out labs to every 2 weeks.- CT C/A/P ordered prior to next visit.- Fatigue: stable, improves during week without treatment, discussed adjusting dosing for this symptom, but she would like to continue current regimen through her CT imaging- Cough: stable, continue to monitor- Nausea: continue PRN antiemetics- Continue to monitor bowel habits for persistent constipation/diarrhea, avoid triggering foods.- History of DVT: continue Eliquis  indefinitely- Other future options: bevacizumab may be an option (but concerned to use now given risk of falls and concurrent blood thinner use), other hormonal blocking agents, and clinical trials (none currently open)RTC 4 weeks or sooner as needed.Answered all of patient's questions and she is agreeable with this plan.Lauraine Gee, PA

## 2024-02-26 ENCOUNTER — Encounter: Payer: Self-pay | Admitting: Hematology

## 2024-02-26 ENCOUNTER — Ambulatory Visit: Attending: Hematology

## 2024-02-26 ENCOUNTER — Other Ambulatory Visit: Payer: Self-pay

## 2024-02-26 VITALS — BP 136/70 | HR 73 | Temp 96.8°F | Resp 12

## 2024-02-26 DIAGNOSIS — D3911 Neoplasm of uncertain behavior of right ovary: Secondary | ICD-10-CM

## 2024-02-26 DIAGNOSIS — D3910 Neoplasm of uncertain behavior of unspecified ovary: Secondary | ICD-10-CM | POA: Insufficient documentation

## 2024-02-26 LAB — CBC AND DIFFERENTIAL
Baso # K/uL: 0.1 10*3/uL (ref 0.0–0.2)
Eos # K/uL: 0.1 10*3/uL (ref 0.0–0.5)
Hematocrit: 32 % — ABNORMAL LOW (ref 34–49)
Hemoglobin: 10.6 g/dL — ABNORMAL LOW (ref 11.2–16.0)
Lymph # K/uL: 1.4 10*3/uL (ref 1.0–5.0)
MCV: 100 fL (ref 75–100)
Mono # K/uL: 0.1 10*3/uL (ref 0.1–1.0)
Neut # K/uL: 1 10*3/uL — ABNORMAL LOW (ref 1.5–6.5)
Nucl RBC # K/uL: 0 10*3/uL
Nucl RBC %: 0 /100{WBCs} (ref 0.0–0.2)
Platelets: 132 10*3/uL — ABNORMAL LOW (ref 150–450)
RBC: 3.2 MIL/uL — ABNORMAL LOW (ref 4.0–5.5)
RDW: 17.8 % — ABNORMAL HIGH (ref 0.0–15.0)
Seg Neut %: 37.7 %
WBC: 2.7 10*3/uL — ABNORMAL LOW (ref 3.5–11.0)

## 2024-02-26 LAB — COMPREHENSIVE METABOLIC PANEL
ALT: 25 U/L (ref 0–35)
AST: 33 U/L (ref 0–35)
Albumin: 3.9 g/dL (ref 3.5–5.2)
Alk Phos: 65 U/L (ref 35–105)
Anion Gap: 13 (ref 7–16)
Bilirubin,Total: 0.5 mg/dL (ref 0.0–1.2)
CO2: 24 mmol/L (ref 20–28)
Calcium: 9.1 mg/dL (ref 8.6–10.2)
Chloride: 105 mmol/L (ref 96–108)
Creatinine: 1.19 mg/dL — ABNORMAL HIGH (ref 0.51–0.95)
Glucose: 110 mg/dL — ABNORMAL HIGH (ref 60–99)
Lab: 15 mg/dL (ref 6–20)
Potassium: 3.6 mmol/L (ref 3.3–5.1)
Sodium: 142 mmol/L (ref 133–145)
Total Protein: 6.2 g/dL — ABNORMAL LOW (ref 6.3–7.7)
eGFR BY CREAT: 47 — AB

## 2024-02-26 LAB — RBC MORPHOLOGY

## 2024-02-26 LAB — DIFF MANUAL
Diff Based On: 114 {cells}
React Lymph %: 1.8 % (ref 0.0–6.0)

## 2024-02-26 LAB — MAGNESIUM: Magnesium: 1.8 mg/dL (ref 1.6–2.5)

## 2024-02-26 MED ORDER — SODIUM CHLORIDE 0.9 % INJ (FLUSH) WRAPPED *I*
10.0000 mL | Status: DC | PRN
  Administered 2024-02-26: 30 mL

## 2024-02-26 NOTE — Progress Notes (Signed)
 Patient arrived for Q2 week [employer]. Husband Ray chairside for the entire visit. Patient has no concerns at this time. IVAD flushed well with brisk blood return. Standing labs drawn and collected. IVAD flushed with normal saline and de-accessed. NOT heparin  flushed because the patient is returning prior to two months. Appointment made for 2 weeks out (2/10) - declined AVS printout, uses MyChart.

## 2024-02-27 ENCOUNTER — Other Ambulatory Visit: Payer: Self-pay

## 2024-02-28 ENCOUNTER — Other Ambulatory Visit: Payer: Self-pay

## 2024-02-28 NOTE — Progress Notes (Signed)
 [facility] SOCIAL WORK:   Patient referred based on distress screen?: No    Patient referred due to G8?: No  Social Work contact made?: No    Social Work Assessment:  SDOH and Surveyor, Quantity  Social Work post assessment plan:  ReferralReferral:   Pharmacy Assistance:  Pharmacy Voucher  Time spent on this encounter (in minutes):  8 Covering [facility] SOCIAL WORK  PHARMACY FORM Todays date:  January 29, 2026Patient Name: Julie Walsh    Medical Record #: [id number] DOB: [birth year]Patients Address: [address] [place]          Social Worker: Mickel Rattan, LMSW     Date of Service: February 28, 2024   Funding Source: [place] Medication Assistance Fund___________________________________________________________________Pharmacy Information:Date/time sent: February 28, 2024   Time needed: asapPatient Location: OutpatientMedication Pick-up Preference: Pharmacy Mail Delivery Pharmacy Contact: Ozell Gamer BerriosSocial work signature: Elward Nocera, LMSWSupervisor/Manager Approval (if indicated):  Date: 02/28/24(supervisor signature not required for Medicaid pending)

## 2024-02-28 NOTE — Patient Instructions (Signed)
 February 2026  Sunday Monday Tuesday Wednesday Thursday Friday Saturday  1 2 3  4Ibrance 5Ibrance 6Ibrance 7Ibrance 8Ibrance 9Ibrance 10IbrancePORT DRAW 9:30 AM (30 min.) CAN CTR, HH POD C [facility] 11Ibrance 12Ibrance 13Ibrance 14Ibrance 15Ibrance 16Ibrance 17IbranceCT Abd Pelvis With Contrast 3:45 PM (15 min.) RIS, RR CT1 (RRCT1) [facility] 18Ibrance 19Ibrance 20Ibrance 21Ibrance 22Ibrance 23IbranceFOLLOW UP VISIT 1:30 PM (15 min.) Shlomo Hubert NOVAK, MD, PhD [facility] 24Ibrance 25OFF --> 26 27 28  Next cycle due 3/4

## 2024-02-29 ENCOUNTER — Other Ambulatory Visit: Payer: Self-pay

## 2024-03-01 LAB — INHIBIN B: Inhibin B: 73 pg/mL — ABNORMAL HIGH (ref <=11)

## 2024-03-03 ENCOUNTER — Other Ambulatory Visit: Payer: Self-pay

## 2024-03-04 ENCOUNTER — Encounter: Payer: Self-pay | Admitting: Hematology

## 2024-03-06 ENCOUNTER — Other Ambulatory Visit: Payer: Self-pay

## 2024-03-11 ENCOUNTER — Ambulatory Visit

## 2024-03-18 ENCOUNTER — Other Ambulatory Visit: Admitting: Radiology

## 2024-03-24 ENCOUNTER — Ambulatory Visit: Admitting: Hematology
